# Patient Record
Sex: Male | Born: 1995
Health system: Southern US, Community
[De-identification: ages and names within clinical notes are randomized; demographics above are authoritative.]

## PROBLEM LIST (undated history)

## (undated) DIAGNOSIS — K589 Irritable bowel syndrome without diarrhea: Secondary | ICD-10-CM

## (undated) DIAGNOSIS — R482 Apraxia: Secondary | ICD-10-CM

## (undated) DIAGNOSIS — I10 Essential (primary) hypertension: Secondary | ICD-10-CM

## (undated) DIAGNOSIS — Q642 Congenital posterior urethral valves: Secondary | ICD-10-CM

## (undated) DIAGNOSIS — N289 Disorder of kidney and ureter, unspecified: Secondary | ICD-10-CM

## (undated) DIAGNOSIS — J45909 Unspecified asthma, uncomplicated: Secondary | ICD-10-CM

## (undated) HISTORY — DX: Apraxia: R48.2

## (undated) HISTORY — DX: Essential (primary) hypertension: I10

## (undated) HISTORY — PX: OTHER SURGICAL HISTORY: SHX169

## (undated) HISTORY — DX: Irritable bowel syndrome, unspecified: K58.9

## (undated) HISTORY — PX: ADENOIDECTOMY: SUR15

## (undated) HISTORY — PX: TONSILLECTOMY: SUR1361

## (undated) HISTORY — PX: NEPHRECTOMY: SHX65

## (undated) HISTORY — PX: LASIK: SHX215

## (undated) HISTORY — DX: Unspecified asthma, uncomplicated: J45.909

## (undated) NOTE — *Deleted (*Deleted)
Depression screen [facility] 2/9 02/03/2020 11/17/2019 10/19/2019  Decreased Interest 0 0 2  Down, Depressed, Hopeless 0 0 1  PHQ - 2 Score 0 0 3  Altered sleeping 1 - 1  Tired, decreased energy 3 - 3  Change in appetite 3 - 0  Feeling bad or failure about yourself  0 - 0  Trouble concentrating 1 - 3  Moving slowly or fidgety/restless 0 - 0  Suicidal thoughts 0 - 0  PHQ-9 Score 8 - 10  Difficult doing work/chores Somewhat difficult - Not difficult at all  Some recent data might be hidden

---
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
## 2015-04-13 ENCOUNTER — Other Ambulatory Visit: Payer: Self-pay | Admitting: Gastroenterology

## 2015-04-13 DIAGNOSIS — R110 Nausea: Secondary | ICD-10-CM

## 2015-04-13 DIAGNOSIS — R1011 Right upper quadrant pain: Secondary | ICD-10-CM

## 2015-04-25 ENCOUNTER — Ambulatory Visit (INDEPENDENT_AMBULATORY_CARE_PROVIDER_SITE_OTHER): Payer: BLUE CROSS/BLUE SHIELD | Admitting: Podiatry

## 2015-04-25 ENCOUNTER — Encounter: Payer: Self-pay | Admitting: Podiatry

## 2015-04-25 VITALS — BP 138/81 | HR 82 | Resp 16 | Ht 74.0 in | Wt 166.0 lb

## 2015-04-25 DIAGNOSIS — Z118 Encounter for screening for other infectious and parasitic diseases: Secondary | ICD-10-CM | POA: Diagnosis not present

## 2015-04-25 NOTE — Progress Notes (Signed)
   Subjective:    Patient ID: James Matthews, male    DOB: Jan 27, 1996, 19 y.o.   MRN: [id number]  HPI Patient presents with a skin condition on their left foot, pinky toe. Looks like rash on top of toe and both side of pinky toe. Going on since March 28, 2015.   Review of Systems  Constitutional: Positive for appetite change.  HENT: Positive for trouble swallowing.   Gastrointestinal: Positive for nausea, abdominal pain and abdominal distention.  Allergic/Immunologic: Positive for environmental allergies and food allergies.  Neurological: Positive for headaches.  All other systems reviewed and are negative.      Objective:   Physical Exam        Assessment & Plan:

## 2015-04-26 ENCOUNTER — Ambulatory Visit (HOSPITAL_COMMUNITY)
Admission: RE | Admit: 2015-04-26 | Discharge: 2015-04-26 | Disposition: A | Discharge status: Home / Self Care | Payer: BLUE CROSS/BLUE SHIELD | Source: Ambulatory Visit | Attending: Gastroenterology | Admitting: Gastroenterology

## 2015-04-26 DIAGNOSIS — R110 Nausea: Secondary | ICD-10-CM

## 2015-04-26 DIAGNOSIS — R112 Nausea with vomiting, unspecified: Secondary | ICD-10-CM | POA: Diagnosis not present

## 2015-04-26 DIAGNOSIS — R140 Abdominal distension (gaseous): Secondary | ICD-10-CM | POA: Diagnosis not present

## 2015-04-26 DIAGNOSIS — K219 Gastro-esophageal reflux disease without esophagitis: Secondary | ICD-10-CM | POA: Insufficient documentation

## 2015-04-26 DIAGNOSIS — R1011 Right upper quadrant pain: Secondary | ICD-10-CM | POA: Diagnosis not present

## 2015-04-26 MED ORDER — TECHNETIUM TC 99M SULFUR COLLOID
2.1000 | Freq: Once | INTRAVENOUS | Status: DC | PRN
  Administered 2015-04-26: 2.1 via ORAL
  Filled 2015-04-26: qty 2.1

## 2015-04-26 NOTE — Progress Notes (Signed)
Subjective:     Patient ID: James Matthews, male   DOB: 11-11-1995, 19 y.o.   MRN: [id number]  HPI patient presents with redness on the left fifth toe both proximal and distal on top of the toe with small little bubble present. Has been present for around 4 weeks   Review of Systems  All other systems reviewed and are negative.      Objective:   Physical Exam  Constitutional: He is oriented to person, place, and time.  Cardiovascular: Intact distal pulses.   Musculoskeletal: Normal range of motion.  Neurological: He is oriented to person, place, and time.  Skin: Skin is warm.  Nursing note and vitals reviewed.  neurovascular status intact muscle strength adequate range of motion within normal limits with patient noted to have redness and small bulbous on the fifth digit left that are localized in nature with no proximal edema erythema or drainage noted     Assessment:     Probable fungal infection left    Plan:     Begin over-the-counter Lamisil to be followed by prescription antifungal if symptoms persist and possible oral medicines and if no improvement we will consult the dermatologist for this condition

## 2015-09-26 ENCOUNTER — Other Ambulatory Visit: Payer: Self-pay | Admitting: Nurse Practitioner

## 2015-09-26 DIAGNOSIS — R1084 Generalized abdominal pain: Secondary | ICD-10-CM

## 2015-09-30 ENCOUNTER — Other Ambulatory Visit: Payer: BLUE CROSS/BLUE SHIELD

## 2015-10-11 ENCOUNTER — Ambulatory Visit
Admission: RE | Admit: 2015-10-11 | Discharge: 2015-10-11 | Disposition: A | Discharge status: Home / Self Care | Payer: BLUE CROSS/BLUE SHIELD | Source: Ambulatory Visit | Attending: Gastroenterology | Admitting: Gastroenterology

## 2015-10-11 ENCOUNTER — Other Ambulatory Visit: Payer: Self-pay | Admitting: Gastroenterology

## 2015-10-11 DIAGNOSIS — K5900 Constipation, unspecified: Secondary | ICD-10-CM

## 2015-10-11 DIAGNOSIS — R110 Nausea: Secondary | ICD-10-CM | POA: Diagnosis not present

## 2015-10-11 DIAGNOSIS — K219 Gastro-esophageal reflux disease without esophagitis: Secondary | ICD-10-CM | POA: Diagnosis not present

## 2015-10-11 DIAGNOSIS — R109 Unspecified abdominal pain: Secondary | ICD-10-CM | POA: Diagnosis not present

## 2015-10-26 DIAGNOSIS — N189 Chronic kidney disease, unspecified: Secondary | ICD-10-CM | POA: Diagnosis not present

## 2015-10-26 DIAGNOSIS — N3090 Cystitis, unspecified without hematuria: Secondary | ICD-10-CM | POA: Diagnosis not present

## 2015-10-26 DIAGNOSIS — I129 Hypertensive chronic kidney disease with stage 1 through stage 4 chronic kidney disease, or unspecified chronic kidney disease: Secondary | ICD-10-CM | POA: Diagnosis not present

## 2015-10-26 DIAGNOSIS — N181 Chronic kidney disease, stage 1: Secondary | ICD-10-CM | POA: Diagnosis not present

## 2015-11-30 ENCOUNTER — Ambulatory Visit (INDEPENDENT_AMBULATORY_CARE_PROVIDER_SITE_OTHER): Payer: BLUE CROSS/BLUE SHIELD | Admitting: Licensed Clinical Social Worker

## 2015-11-30 DIAGNOSIS — F331 Major depressive disorder, recurrent, moderate: Secondary | ICD-10-CM | POA: Diagnosis not present

## 2015-11-30 DIAGNOSIS — F064 Anxiety disorder due to known physiological condition: Secondary | ICD-10-CM | POA: Diagnosis not present

## 2015-12-02 DIAGNOSIS — N1330 Unspecified hydronephrosis: Secondary | ICD-10-CM | POA: Diagnosis not present

## 2015-12-02 DIAGNOSIS — N200 Calculus of kidney: Secondary | ICD-10-CM | POA: Diagnosis not present

## 2015-12-02 DIAGNOSIS — Z87442 Personal history of urinary calculi: Secondary | ICD-10-CM | POA: Diagnosis not present

## 2015-12-02 DIAGNOSIS — N319 Neuromuscular dysfunction of bladder, unspecified: Secondary | ICD-10-CM | POA: Diagnosis not present

## 2015-12-02 DIAGNOSIS — Q600 Renal agenesis, unilateral: Secondary | ICD-10-CM | POA: Diagnosis not present

## 2015-12-06 ENCOUNTER — Ambulatory Visit: Payer: BLUE CROSS/BLUE SHIELD | Admitting: Licensed Clinical Social Worker

## 2016-01-02 DIAGNOSIS — K219 Gastro-esophageal reflux disease without esophagitis: Secondary | ICD-10-CM | POA: Diagnosis not present

## 2016-01-05 ENCOUNTER — Other Ambulatory Visit: Payer: Self-pay | Admitting: Gastroenterology

## 2016-01-09 ENCOUNTER — Ambulatory Visit (INDEPENDENT_AMBULATORY_CARE_PROVIDER_SITE_OTHER): Payer: BLUE CROSS/BLUE SHIELD | Admitting: Psychology

## 2016-01-09 DIAGNOSIS — F341 Dysthymic disorder: Secondary | ICD-10-CM | POA: Diagnosis not present

## 2016-01-09 DIAGNOSIS — F064 Anxiety disorder due to known physiological condition: Secondary | ICD-10-CM

## 2016-01-17 ENCOUNTER — Ambulatory Visit (INDEPENDENT_AMBULATORY_CARE_PROVIDER_SITE_OTHER): Payer: BLUE CROSS/BLUE SHIELD | Admitting: Psychology

## 2016-01-17 DIAGNOSIS — F341 Dysthymic disorder: Secondary | ICD-10-CM

## 2016-01-17 DIAGNOSIS — F064 Anxiety disorder due to known physiological condition: Secondary | ICD-10-CM

## 2016-01-18 DIAGNOSIS — K317 Polyp of stomach and duodenum: Secondary | ICD-10-CM | POA: Diagnosis not present

## 2016-01-18 DIAGNOSIS — K219 Gastro-esophageal reflux disease without esophagitis: Secondary | ICD-10-CM | POA: Diagnosis not present

## 2016-01-23 ENCOUNTER — Ambulatory Visit (HOSPITAL_COMMUNITY)
Admission: RE | Admit: 2016-01-23 | Discharge: 2016-01-23 | Disposition: A | Discharge status: Home / Self Care | Payer: BLUE CROSS/BLUE SHIELD | Source: Ambulatory Visit | Attending: Gastroenterology | Admitting: Gastroenterology

## 2016-01-23 ENCOUNTER — Encounter (HOSPITAL_COMMUNITY)
Admission: RE | Disposition: A | Discharge status: Home / Self Care | Payer: Self-pay | Source: Ambulatory Visit | Attending: Gastroenterology

## 2016-01-23 DIAGNOSIS — R12 Heartburn: Secondary | ICD-10-CM | POA: Diagnosis not present

## 2016-01-23 DIAGNOSIS — K449 Diaphragmatic hernia without obstruction or gangrene: Secondary | ICD-10-CM | POA: Insufficient documentation

## 2016-01-23 DIAGNOSIS — R142 Eructation: Secondary | ICD-10-CM | POA: Insufficient documentation

## 2016-01-23 HISTORY — PX: ESOPHAGEAL MANOMETRY: SHX5429

## 2016-01-23 SURGERY — MANOMETRY, ESOPHAGUS

## 2016-01-23 MED ORDER — LIDOCAINE VISCOUS 2 % MT SOLN
OROMUCOSAL | Status: AC
  Filled 2016-01-23: qty 15

## 2016-01-23 SURGICAL SUPPLY — 2 items
FACESHIELD LNG OPTICON STERILE (SAFETY) IMPLANT
GLOVE BIO SURGEON STRL SZ8 (GLOVE) ×4 IMPLANT

## 2016-01-23 NOTE — Progress Notes (Signed)
Esophageal Manometry done per protocol. Pt tolerated well without difficulty or complication.  Report to be sent to Dr. Kenna GilbertMann's office.

## 2016-01-24 ENCOUNTER — Encounter (HOSPITAL_COMMUNITY): Payer: Self-pay | Admitting: Gastroenterology

## 2016-01-24 DIAGNOSIS — K219 Gastro-esophageal reflux disease without esophagitis: Secondary | ICD-10-CM | POA: Diagnosis not present

## 2016-01-30 ENCOUNTER — Ambulatory Visit (INDEPENDENT_AMBULATORY_CARE_PROVIDER_SITE_OTHER): Payer: BLUE CROSS/BLUE SHIELD | Admitting: Psychology

## 2016-01-30 DIAGNOSIS — F064 Anxiety disorder due to known physiological condition: Secondary | ICD-10-CM | POA: Diagnosis not present

## 2016-01-30 DIAGNOSIS — F331 Major depressive disorder, recurrent, moderate: Secondary | ICD-10-CM

## 2016-02-14 ENCOUNTER — Ambulatory Visit (INDEPENDENT_AMBULATORY_CARE_PROVIDER_SITE_OTHER): Payer: BLUE CROSS/BLUE SHIELD | Admitting: Psychology

## 2016-02-14 DIAGNOSIS — F064 Anxiety disorder due to known physiological condition: Secondary | ICD-10-CM | POA: Diagnosis not present

## 2016-02-14 DIAGNOSIS — F331 Major depressive disorder, recurrent, moderate: Secondary | ICD-10-CM

## 2016-02-20 ENCOUNTER — Ambulatory Visit (INDEPENDENT_AMBULATORY_CARE_PROVIDER_SITE_OTHER): Payer: BLUE CROSS/BLUE SHIELD | Admitting: Psychology

## 2016-02-20 DIAGNOSIS — F064 Anxiety disorder due to known physiological condition: Secondary | ICD-10-CM

## 2016-02-20 DIAGNOSIS — F341 Dysthymic disorder: Secondary | ICD-10-CM | POA: Diagnosis not present

## 2016-02-24 DIAGNOSIS — L720 Epidermal cyst: Secondary | ICD-10-CM | POA: Diagnosis not present

## 2016-02-24 DIAGNOSIS — D225 Melanocytic nevi of trunk: Secondary | ICD-10-CM | POA: Diagnosis not present

## 2016-02-28 ENCOUNTER — Ambulatory Visit (INDEPENDENT_AMBULATORY_CARE_PROVIDER_SITE_OTHER): Payer: BLUE CROSS/BLUE SHIELD | Admitting: Psychology

## 2016-02-28 DIAGNOSIS — F341 Dysthymic disorder: Secondary | ICD-10-CM | POA: Diagnosis not present

## 2016-02-28 DIAGNOSIS — F064 Anxiety disorder due to known physiological condition: Secondary | ICD-10-CM | POA: Diagnosis not present

## 2016-03-08 ENCOUNTER — Ambulatory Visit (INDEPENDENT_AMBULATORY_CARE_PROVIDER_SITE_OTHER): Payer: BLUE CROSS/BLUE SHIELD | Admitting: Psychology

## 2016-03-08 DIAGNOSIS — F341 Dysthymic disorder: Secondary | ICD-10-CM | POA: Diagnosis not present

## 2016-03-08 DIAGNOSIS — F064 Anxiety disorder due to known physiological condition: Secondary | ICD-10-CM | POA: Diagnosis not present

## 2016-03-22 ENCOUNTER — Ambulatory Visit (INDEPENDENT_AMBULATORY_CARE_PROVIDER_SITE_OTHER): Payer: BLUE CROSS/BLUE SHIELD | Admitting: Psychology

## 2016-03-22 DIAGNOSIS — F064 Anxiety disorder due to known physiological condition: Secondary | ICD-10-CM | POA: Diagnosis not present

## 2016-03-22 DIAGNOSIS — F341 Dysthymic disorder: Secondary | ICD-10-CM | POA: Diagnosis not present

## 2016-03-29 ENCOUNTER — Ambulatory Visit (INDEPENDENT_AMBULATORY_CARE_PROVIDER_SITE_OTHER): Payer: BLUE CROSS/BLUE SHIELD | Admitting: Psychology

## 2016-03-29 DIAGNOSIS — F064 Anxiety disorder due to known physiological condition: Secondary | ICD-10-CM | POA: Diagnosis not present

## 2016-03-29 DIAGNOSIS — F341 Dysthymic disorder: Secondary | ICD-10-CM | POA: Diagnosis not present

## 2016-04-05 ENCOUNTER — Ambulatory Visit (INDEPENDENT_AMBULATORY_CARE_PROVIDER_SITE_OTHER): Payer: BLUE CROSS/BLUE SHIELD | Admitting: Psychology

## 2016-04-05 DIAGNOSIS — F064 Anxiety disorder due to known physiological condition: Secondary | ICD-10-CM

## 2016-04-05 DIAGNOSIS — F341 Dysthymic disorder: Secondary | ICD-10-CM | POA: Diagnosis not present

## 2016-04-12 ENCOUNTER — Ambulatory Visit (INDEPENDENT_AMBULATORY_CARE_PROVIDER_SITE_OTHER): Payer: BLUE CROSS/BLUE SHIELD | Admitting: Psychology

## 2016-04-12 DIAGNOSIS — F064 Anxiety disorder due to known physiological condition: Secondary | ICD-10-CM

## 2016-04-12 DIAGNOSIS — F341 Dysthymic disorder: Secondary | ICD-10-CM | POA: Diagnosis not present

## 2016-04-19 ENCOUNTER — Ambulatory Visit (INDEPENDENT_AMBULATORY_CARE_PROVIDER_SITE_OTHER): Payer: BLUE CROSS/BLUE SHIELD | Admitting: Psychology

## 2016-04-19 DIAGNOSIS — F341 Dysthymic disorder: Secondary | ICD-10-CM | POA: Diagnosis not present

## 2016-04-19 DIAGNOSIS — F064 Anxiety disorder due to known physiological condition: Secondary | ICD-10-CM

## 2016-04-26 ENCOUNTER — Ambulatory Visit: Payer: BLUE CROSS/BLUE SHIELD | Admitting: Psychology

## 2016-05-01 ENCOUNTER — Ambulatory Visit (INDEPENDENT_AMBULATORY_CARE_PROVIDER_SITE_OTHER): Payer: BLUE CROSS/BLUE SHIELD | Admitting: Psychology

## 2016-05-01 DIAGNOSIS — F341 Dysthymic disorder: Secondary | ICD-10-CM

## 2016-05-01 DIAGNOSIS — F064 Anxiety disorder due to known physiological condition: Secondary | ICD-10-CM | POA: Diagnosis not present

## 2016-05-03 DIAGNOSIS — I129 Hypertensive chronic kidney disease with stage 1 through stage 4 chronic kidney disease, or unspecified chronic kidney disease: Secondary | ICD-10-CM | POA: Diagnosis not present

## 2016-05-03 DIAGNOSIS — N181 Chronic kidney disease, stage 1: Secondary | ICD-10-CM | POA: Diagnosis not present

## 2016-05-03 DIAGNOSIS — E790 Hyperuricemia without signs of inflammatory arthritis and tophaceous disease: Secondary | ICD-10-CM | POA: Diagnosis not present

## 2016-05-03 DIAGNOSIS — K219 Gastro-esophageal reflux disease without esophagitis: Secondary | ICD-10-CM | POA: Diagnosis not present

## 2016-05-03 DIAGNOSIS — Z87440 Personal history of urinary (tract) infections: Secondary | ICD-10-CM | POA: Diagnosis not present

## 2016-05-03 DIAGNOSIS — R809 Proteinuria, unspecified: Secondary | ICD-10-CM | POA: Diagnosis not present

## 2016-05-03 DIAGNOSIS — Z87442 Personal history of urinary calculi: Secondary | ICD-10-CM | POA: Diagnosis not present

## 2016-05-03 DIAGNOSIS — Z905 Acquired absence of kidney: Secondary | ICD-10-CM | POA: Diagnosis not present

## 2016-05-03 DIAGNOSIS — Z79899 Other long term (current) drug therapy: Secondary | ICD-10-CM | POA: Diagnosis not present

## 2016-05-07 ENCOUNTER — Ambulatory Visit (INDEPENDENT_AMBULATORY_CARE_PROVIDER_SITE_OTHER): Payer: BLUE CROSS/BLUE SHIELD | Admitting: Psychology

## 2016-05-07 DIAGNOSIS — F341 Dysthymic disorder: Secondary | ICD-10-CM

## 2016-05-07 DIAGNOSIS — F064 Anxiety disorder due to known physiological condition: Secondary | ICD-10-CM

## 2016-05-10 DIAGNOSIS — Z905 Acquired absence of kidney: Secondary | ICD-10-CM | POA: Diagnosis not present

## 2016-05-10 DIAGNOSIS — Q600 Renal agenesis, unilateral: Secondary | ICD-10-CM | POA: Diagnosis not present

## 2016-05-10 DIAGNOSIS — Q6439 Other atresia and stenosis of urethra and bladder neck: Secondary | ICD-10-CM | POA: Diagnosis not present

## 2016-05-10 DIAGNOSIS — Z8249 Family history of ischemic heart disease and other diseases of the circulatory system: Secondary | ICD-10-CM | POA: Diagnosis not present

## 2016-05-10 DIAGNOSIS — Z79899 Other long term (current) drug therapy: Secondary | ICD-10-CM | POA: Diagnosis not present

## 2016-05-10 DIAGNOSIS — N2881 Hypertrophy of kidney: Secondary | ICD-10-CM | POA: Diagnosis not present

## 2016-05-10 DIAGNOSIS — N318 Other neuromuscular dysfunction of bladder: Secondary | ICD-10-CM | POA: Diagnosis not present

## 2016-05-10 DIAGNOSIS — N319 Neuromuscular dysfunction of bladder, unspecified: Secondary | ICD-10-CM | POA: Diagnosis not present

## 2016-05-10 DIAGNOSIS — Q642 Congenital posterior urethral valves: Secondary | ICD-10-CM | POA: Diagnosis not present

## 2016-05-10 DIAGNOSIS — N1330 Unspecified hydronephrosis: Secondary | ICD-10-CM | POA: Diagnosis not present

## 2016-05-10 DIAGNOSIS — N200 Calculus of kidney: Secondary | ICD-10-CM | POA: Diagnosis not present

## 2016-05-10 DIAGNOSIS — Z841 Family history of disorders of kidney and ureter: Secondary | ICD-10-CM | POA: Diagnosis not present

## 2016-05-11 ENCOUNTER — Ambulatory Visit (INDEPENDENT_AMBULATORY_CARE_PROVIDER_SITE_OTHER): Payer: BLUE CROSS/BLUE SHIELD | Admitting: Psychology

## 2016-05-11 DIAGNOSIS — F064 Anxiety disorder due to known physiological condition: Secondary | ICD-10-CM

## 2016-05-11 DIAGNOSIS — F341 Dysthymic disorder: Secondary | ICD-10-CM

## 2016-05-15 DIAGNOSIS — N1330 Unspecified hydronephrosis: Secondary | ICD-10-CM | POA: Diagnosis not present

## 2016-05-15 DIAGNOSIS — N319 Neuromuscular dysfunction of bladder, unspecified: Secondary | ICD-10-CM | POA: Diagnosis not present

## 2016-05-15 DIAGNOSIS — N200 Calculus of kidney: Secondary | ICD-10-CM | POA: Diagnosis not present

## 2016-05-17 ENCOUNTER — Ambulatory Visit (INDEPENDENT_AMBULATORY_CARE_PROVIDER_SITE_OTHER): Payer: BLUE CROSS/BLUE SHIELD | Admitting: Psychology

## 2016-05-17 DIAGNOSIS — F064 Anxiety disorder due to known physiological condition: Secondary | ICD-10-CM

## 2016-05-17 DIAGNOSIS — F341 Dysthymic disorder: Secondary | ICD-10-CM | POA: Diagnosis not present

## 2016-05-18 ENCOUNTER — Ambulatory Visit: Payer: BLUE CROSS/BLUE SHIELD | Admitting: Psychology

## 2016-05-24 ENCOUNTER — Ambulatory Visit (INDEPENDENT_AMBULATORY_CARE_PROVIDER_SITE_OTHER): Payer: BLUE CROSS/BLUE SHIELD | Admitting: Psychology

## 2016-05-24 DIAGNOSIS — F330 Major depressive disorder, recurrent, mild: Secondary | ICD-10-CM | POA: Diagnosis not present

## 2016-05-24 DIAGNOSIS — F418 Other specified anxiety disorders: Secondary | ICD-10-CM

## 2016-05-24 DIAGNOSIS — F341 Dysthymic disorder: Secondary | ICD-10-CM | POA: Diagnosis not present

## 2016-05-24 DIAGNOSIS — F064 Anxiety disorder due to known physiological condition: Secondary | ICD-10-CM

## 2016-05-28 ENCOUNTER — Ambulatory Visit (INDEPENDENT_AMBULATORY_CARE_PROVIDER_SITE_OTHER): Payer: BLUE CROSS/BLUE SHIELD | Admitting: Psychology

## 2016-05-28 DIAGNOSIS — F418 Other specified anxiety disorders: Secondary | ICD-10-CM | POA: Diagnosis not present

## 2016-05-28 DIAGNOSIS — F330 Major depressive disorder, recurrent, mild: Secondary | ICD-10-CM | POA: Diagnosis not present

## 2016-05-28 DIAGNOSIS — F341 Dysthymic disorder: Secondary | ICD-10-CM

## 2016-05-28 DIAGNOSIS — F064 Anxiety disorder due to known physiological condition: Secondary | ICD-10-CM | POA: Diagnosis not present

## 2016-06-04 ENCOUNTER — Encounter: Payer: Self-pay | Admitting: Family Medicine

## 2016-06-04 ENCOUNTER — Ambulatory Visit (INDEPENDENT_AMBULATORY_CARE_PROVIDER_SITE_OTHER): Payer: BLUE CROSS/BLUE SHIELD | Admitting: Family Medicine

## 2016-06-04 VITALS — BP 118/56 | HR 96 | Temp 98.2°F | Ht 73.0 in | Wt 175.8 lb

## 2016-06-04 DIAGNOSIS — F411 Generalized anxiety disorder: Secondary | ICD-10-CM | POA: Insufficient documentation

## 2016-06-04 DIAGNOSIS — R482 Apraxia: Secondary | ICD-10-CM | POA: Insufficient documentation

## 2016-06-04 DIAGNOSIS — K589 Irritable bowel syndrome without diarrhea: Secondary | ICD-10-CM | POA: Insufficient documentation

## 2016-06-04 DIAGNOSIS — I10 Essential (primary) hypertension: Secondary | ICD-10-CM | POA: Insufficient documentation

## 2016-06-04 DIAGNOSIS — N3289 Other specified disorders of bladder: Secondary | ICD-10-CM | POA: Insufficient documentation

## 2016-06-04 DIAGNOSIS — F339 Major depressive disorder, recurrent, unspecified: Secondary | ICD-10-CM | POA: Insufficient documentation

## 2016-06-04 DIAGNOSIS — F331 Major depressive disorder, recurrent, moderate: Secondary | ICD-10-CM

## 2016-06-04 DIAGNOSIS — N182 Chronic kidney disease, stage 2 (mild): Secondary | ICD-10-CM | POA: Insufficient documentation

## 2016-06-04 DIAGNOSIS — Q642 Congenital posterior urethral valves: Secondary | ICD-10-CM | POA: Insufficient documentation

## 2016-06-04 DIAGNOSIS — K219 Gastro-esophageal reflux disease without esophagitis: Secondary | ICD-10-CM | POA: Insufficient documentation

## 2016-06-04 MED ORDER — ALPRAZOLAM 0.5 MG PO TABS: 0.5000 mg | ORAL_TABLET | Freq: Two times a day (BID) | ORAL | 0 refills | Status: DC | PRN

## 2016-06-04 MED ORDER — ESCITALOPRAM OXALATE 10 MG PO TABS: 10.0000 mg | ORAL_TABLET | Freq: Every day | ORAL | 2 refills | Status: DC

## 2016-06-04 NOTE — Patient Instructions (Signed)
Start lexapro- goal is for this to help with anxiety and depression. See me again in about 2 weeks so we can regroup. May go up in dose at that time depending on how you are doing.   Taking the medicine as directed and not missing any doses is one of the best things you can do to treat your depression.  Here are some things to keep in mind:  1) Side effects (stomach upset, some increased anxiety) may happen before you notice a benefit.  These side effects typically go away over time. 2) Changes to your dose of medicine or a change in medication all together is sometimes necessary 3) Most people need to be on medication at least 6-12 months 4) Many people will notice an improvement within two weeks but the full effect of the medication can take up to 4-6 weeks 5) Stopping the medication when you start feeling better often results in a return of symptoms 6) If you start having thoughts of hurting yourself or others after starting this medicine, call our office immediately at [phone] or seek care through 911.

## 2016-06-04 NOTE — Assessment & Plan Note (Addendum)
S:Diagnosed by Dr. Jason FilaBray. Also GAD. No ADD. Started in 4th grade after some bullying he experienced for his verbal apraxia. Continued mild to moderate issues. PHQ9 today of 15 with no SI, GAD7 of 13. History of trial prozac with "up boost" in his mood. He denies every having mania symptoms outside of that potential period- but he states he was up in day but still rested at night. No anemia or tsh issues within a year as per review of careeverywhere A/P: Will trial lexapro 10mg  with follow up in 2 weeks. Also short term xanax to help with anxiety portion #30- hopeful we can eventually reduce his 6  A week.

## 2016-06-04 NOTE — Progress Notes (Signed)
Phone: [phone]  Subjective:  Patient presents today to establish care. Prior patient of Dr. Tomi [place]ambergerSusan Fuller in [place] . Chief complaint-noted.   See problem oriented charting  The following were reviewed and entered/updated in epic: Past Medical History:  Diagnosis Date  . Hypertension   . IBS (irritable bowel syndrome)    probiotic  . Posterior urethral valves   . Premature birth    3 months early  . Renal disorder   . Verbal apraxia    Patient Active Problem List   Diagnosis Date Noted  . Major depressive disorder, recurrent episode, moderate (HCC) 06/04/2016    Priority: High  . Verbal apraxia     Priority: High  . Posterior urethral valves (PUV) 06/04/2016    Priority: Medium  . Essential hypertension 06/04/2016    Priority: Medium  . Bladder spasms 06/04/2016    Priority: Medium  . GAD (generalized anxiety disorder) 06/04/2016    Priority: Medium  . Chronic kidney disease, stage I 06/04/2016    Priority: Medium  . GERD (gastroesophageal reflux disease) 06/04/2016    Priority: Low  . Premature birth     Priority: Low  . IBS (irritable bowel syndrome)     Priority: Low   Past Surgical History:  Procedure Laterality Date  . ESOPHAGEAL MANOMETRY N/A 01/23/2016   Procedure: ESOPHAGEAL MANOMETRY (EM);  Surgeon: Charna ElizabethJyothi [name], MD;  Location: [facility];  Service: Endoscopy;  Laterality: N/A;  . NEPHRECTOMY     rght   . TONSILLECTOMY      Family History  Problem Relation Age of Onset  . Breast cancer Mother     survivor  . Hypertension Mother   . Hyperlipidemia Mother   . Heart disease Maternal Grandfather     Medications- reviewed and updated Current Outpatient Prescriptions  Medication Sig Dispense Refill  . ALPRAZolam (XANAX) 0.5 MG tablet Take 1 tablet (0.5 mg total) by mouth 2 (two) times daily as needed for anxiety. 30 tablet 0  . losartan (COZAAR) 25 MG tablet Take 25 mg by mouth daily.    Marland Kitchen. oxybutynin (DITROPAN) 5 MG tablet Take 5 mg  by mouth at bedtime.    Marland Kitchen. escitalopram (LEXAPRO) 10 MG tablet Take 1 tablet (10 mg total) by mouth daily. 30 tablet 2   No current facility-administered medications for this visit.     Allergies-reviewed and updated Allergies  Allergen Reactions  . Lisinopril Other (See Comments)  . Other Other (See Comments)    Pain med? Unsure of medication, caused n/v    Social History   Social History  . Marital status: Single    Spouse name: N/A  . Number of children: N/A  . Years of education: N/A   Social History Main Topics  . Smoking status: Never Smoker  . Smokeless tobacco: None  . Alcohol use No  . Drug use: No  . Sexual activity: Not Asked   Other Topics Concern  . None   Social History Narrative   Single. Lives with mom and dad. His brother and wife and nephew also live with him. Brother and sister- healthy.       Full time at [employer]. Studying health sciences and english. Premed.     ROS--Full ROS was completed Review of Systems  Constitutional: Negative for chills and fever.  HENT: Negative for hearing loss and tinnitus.   Eyes: Negative for blurred vision and double vision.  Respiratory: Negative for cough and hemoptysis.   Cardiovascular: Negative for chest pain  and palpitations.  Gastrointestinal: Negative for heartburn and nausea.  Genitourinary: Negative for dysuria and urgency.  Musculoskeletal: Negative for myalgias and neck pain.  Skin: Negative for itching and rash.  Neurological: Negative for dizziness and headaches.  Endo/Heme/Allergies: Negative for polydipsia. Does not bruise/bleed easily.  Psychiatric/Behavioral: Positive for depression. Negative for hallucinations, substance abuse and suicidal ideas. The patient is nervous/anxious.    Objective: BP (!) 118/56 (BP Location: Left Arm, Patient Position: Sitting, Cuff Size: Large)   Pulse 96   Temp 98.2 F (36.8 C) (Oral)   Ht 6\' 1"  (1.854 m)   Wt 175 lb 12.8 oz (79.7 kg)   SpO2 98%    BMI 23.19 kg/m  Gen: NAD, resting comfortably HEENT: Mucous membranes are moist. Oropharynx normal. TM normal. Eyes: sclera and lids normal, PERRLA Neck: no thyromegaly, no cervical lymphadenopathy CV: RRR no murmurs rubs or gallops Lungs: CTAB no crackles, wheeze, rhonchi Abdomen: soft/nontender/nondistended/normal bowel sounds. No rebound or guarding.  Ext: no edema Skin: warm, dry Neuro: 5/5 strength in upper and lower extremities, normal gait, normal reflexes, stutters and difficulty finding words at times  Assessment/Plan:  Major depressive disorder, recurrent episode, moderate (HCC) S:Diagnosed by Dr. Jason FilaBray. Also GAD. No ADD. Started in 4th grade after some bullying he experienced for his verbal apraxia. Continued mild to moderate issues. PHQ9 today of 15 with no SI, GAD7 of 13. History of trial prozac with "up boost" in his mood. He denies every having mania symptoms outside of that potential period- but he states he was up in day but still rested at night. No anemia or tsh issues within a year as per review of careeverywhere A/P: Will trial lexapro 10mg  with follow up in 2 weeks. Also short term xanax to help with anxiety portion #30- hopeful we can eventually reduce his 6  A week.    2 weeks  Meds ordered this encounter  . escitalopram (LEXAPRO) 10 MG tablet    Sig: Take 1 tablet (10 mg total) by mouth daily.    Dispense:  30 tablet    Refill:  2  . ALPRAZolam (XANAX) 0.5 MG tablet    Sig: Take 1 tablet (0.5 mg total) by mouth 2 (two) times daily as needed for anxiety.    Dispense:  30 tablet    Refill:  0   The duration of face-to-face time during this visit was greater than 30 minutes. Greater than 50% of this time was spent in counseling about depression, medication options, side effects and needed follow up.    Return precautions advised.  Tana ConchStephen Destry Dauber, MD

## 2016-06-04 NOTE — Progress Notes (Signed)
Pre visit review using our clinic review tool, if applicable. No additional management support is needed unless otherwise documented below in the visit note. 

## 2016-06-08 ENCOUNTER — Encounter: Payer: Self-pay | Admitting: Family Medicine

## 2016-06-18 ENCOUNTER — Encounter: Payer: Self-pay | Admitting: Family Medicine

## 2016-06-18 ENCOUNTER — Ambulatory Visit (INDEPENDENT_AMBULATORY_CARE_PROVIDER_SITE_OTHER): Payer: BLUE CROSS/BLUE SHIELD | Admitting: Family Medicine

## 2016-06-18 VITALS — BP 120/66 | HR 90 | Temp 97.8°F | Ht 73.0 in | Wt 171.8 lb

## 2016-06-18 DIAGNOSIS — F411 Generalized anxiety disorder: Secondary | ICD-10-CM

## 2016-06-18 DIAGNOSIS — F331 Major depressive disorder, recurrent, moderate: Secondary | ICD-10-CM

## 2016-06-18 MED ORDER — ALPRAZOLAM 0.5 MG PO TABS: 0.5000 mg | ORAL_TABLET | Freq: Two times a day (BID) | ORAL | 0 refills | Status: DC | PRN

## 2016-06-18 NOTE — Assessment & Plan Note (Signed)
GAD S: Patient started lexapro 10mg . At first with constipation and heartburn. Heartburn resolved within a few days. Constipation resolved with fiber gummies. More bothersome symptom is that anxiety frequency ha spicked up but not intensity. Was using xanax 4-6 days a week and now up to about 10 pills a week. PHQ9 of 18 with no SI.  A/P: Side effects not tolerable at present- we opted to continue current dose to see if anxiety resolves/improves while covering with short course of xanax again. Hopeful can increase to 20mg  of lexapro at 4 week follow up (would be 6 weeks in). Update phq9 and GAD7 at follow up.

## 2016-06-18 NOTE — Patient Instructions (Signed)
Not seeing great benefit yet on medicine but only at  2 weeks. With anxiety increasing in frequency- hold off on upping lexapro for now and reassess in 4-5 weeks- likely increase at that time if anxiety better. Short term xanax refilled to help with the short term increase in anxiety.   Any thoughts of hurting yourself once again contact us immediately

## 2016-06-18 NOTE — Progress Notes (Signed)
Subjective:  James Matthews is a 20 y.o. year old very pleasant male patient who presents for/with See problem oriented charting ROS- some increased anxiety. Admits to depressed mood. Poor sleep. Some anhedonia.   Past Medical History-  Patient Active Problem List   Diagnosis Date Noted  . Major depressive disorder, recurrent episode, moderate (HCC) 06/04/2016    Priority: High  . Verbal apraxia     Priority: High  . Posterior urethral valves (PUV) 06/04/2016    Priority: Medium  . Essential hypertension 06/04/2016    Priority: Medium  . Bladder spasms 06/04/2016    Priority: Medium  . GAD (generalized anxiety disorder) 06/04/2016    Priority: Medium  . Chronic kidney disease, stage I 06/04/2016    Priority: Medium  . GERD (gastroesophageal reflux disease) 06/04/2016    Priority: Low  . Premature birth     Priority: Low  . IBS (irritable bowel syndrome)     Priority: Low    Medications- reviewed and updated Current Outpatient Prescriptions  Medication Sig Dispense Refill  . ALPRAZolam (XANAX) 0.5 MG tablet Take 1 tablet (0.5 mg total) by mouth 2 (two) times daily as needed for anxiety. 30 tablet 0  . escitalopram (LEXAPRO) 10 MG tablet Take 1 tablet (10 mg total) by mouth daily. 30 tablet 2  . losartan (COZAAR) 25 MG tablet Take 50 mg by mouth daily.     Marland Kitchen. oxybutynin (DITROPAN) 5 MG tablet Take 5 mg by mouth at bedtime.     No current facility-administered medications for this visit.     Objective: BP 120/66 (BP Location: Left Arm, Patient Position: Sitting, Cuff Size: Large)   Pulse 90   Temp 97.8 F (36.6 C) (Oral)   Ht 6\' 1"  (1.854 m)   Wt 171 lb 12.8 oz (77.9 kg)   SpO2 97%   BMI 22.67 kg/m  Gen: NAD, resting comfortably, stutters  Assessment/Plan:  Major depressive disorder, recurrent episode, moderate (HCC) GAD S: Patient started lexapro 10mg . At first with constipation and heartburn. Heartburn resolved within a few days. Constipation resolved  with fiber gummies. More bothersome symptom is that anxiety frequency ha spicked up but not intensity. Was using xanax 4-6 days a week and now up to about 10 pills a week. PHQ9 of 18 with no SI.  A/P: Side effects not tolerable at present- we opted to continue current dose to see if anxiety resolves/improves while covering with short course of xanax again. Hopeful can increase to 20mg  of lexapro at 4 week follow up (would be 6 weeks in). Update phq9 and GAD7 at follow up.    Meds ordered this encounter  Medications  . ALPRAZolam (XANAX) 0.5 MG tablet    Sig: Take 1 tablet (0.5 mg total) by mouth 2 (two) times daily as needed for anxiety.    Dispense:  30 tablet    Refill:  0    Return precautions advised.  James ConchStephen Aldan Camey, MD

## 2016-06-18 NOTE — Progress Notes (Signed)
Pre visit review using our clinic review tool, if applicable. No additional management support is needed unless otherwise documented below in the visit note. 

## 2016-06-22 ENCOUNTER — Ambulatory Visit (INDEPENDENT_AMBULATORY_CARE_PROVIDER_SITE_OTHER): Payer: BLUE CROSS/BLUE SHIELD | Admitting: Psychology

## 2016-06-22 DIAGNOSIS — F341 Dysthymic disorder: Secondary | ICD-10-CM | POA: Diagnosis not present

## 2016-06-22 DIAGNOSIS — F064 Anxiety disorder due to known physiological condition: Secondary | ICD-10-CM

## 2016-06-26 ENCOUNTER — Ambulatory Visit (INDEPENDENT_AMBULATORY_CARE_PROVIDER_SITE_OTHER): Payer: BLUE CROSS/BLUE SHIELD | Admitting: Psychology

## 2016-06-26 DIAGNOSIS — F064 Anxiety disorder due to known physiological condition: Secondary | ICD-10-CM | POA: Diagnosis not present

## 2016-06-26 DIAGNOSIS — F341 Dysthymic disorder: Secondary | ICD-10-CM

## 2016-07-12 ENCOUNTER — Encounter: Payer: Self-pay | Admitting: Family Medicine

## 2016-07-13 ENCOUNTER — Other Ambulatory Visit: Payer: Self-pay

## 2016-07-16 ENCOUNTER — Encounter: Payer: Self-pay | Admitting: Family Medicine

## 2016-07-20 ENCOUNTER — Encounter: Payer: Self-pay | Admitting: Family Medicine

## 2016-07-20 ENCOUNTER — Ambulatory Visit (INDEPENDENT_AMBULATORY_CARE_PROVIDER_SITE_OTHER): Payer: BLUE CROSS/BLUE SHIELD | Admitting: Family Medicine

## 2016-07-20 VITALS — BP 122/68 | HR 92 | Temp 98.2°F | Wt 171.6 lb

## 2016-07-20 DIAGNOSIS — F411 Generalized anxiety disorder: Secondary | ICD-10-CM | POA: Diagnosis not present

## 2016-07-20 DIAGNOSIS — F331 Major depressive disorder, recurrent, moderate: Secondary | ICD-10-CM

## 2016-07-20 MED ORDER — ALPRAZOLAM 0.5 MG PO TABS: 0.5000 mg | ORAL_TABLET | Freq: Two times a day (BID) | ORAL | 0 refills | Status: DC | PRN

## 2016-07-20 MED ORDER — ESCITALOPRAM OXALATE 20 MG PO TABS: 10.0000 mg | ORAL_TABLET | Freq: Every day | ORAL | 2 refills | Status: DC

## 2016-07-20 NOTE — Patient Instructions (Signed)
Increase lexapro to 20mg   Refill xanax  Follow up 6 weeks, contact us immediately or call [phone] if any thoughts of hurting yourself.   Journal your symptoms- we could try an alternate antidepressant/antianxiety medicine if needed

## 2016-07-20 NOTE — Progress Notes (Signed)
Subjective:  [place] Wonda ChengChristian Nahm is a 21 y.o. year old very pleasant male patient who presents for/with See problem oriented charting ROS- admits to anxiety, depressed mood. No SI. No chest pain or palpitations   Past Medical History-  Patient Active Problem List   Diagnosis Date Noted  . Major depressive disorder, recurrent episode, moderate (HCC) 06/04/2016    Priority: High  . Verbal apraxia     Priority: High  . Posterior urethral valves (PUV) 06/04/2016    Priority: Medium  . Essential hypertension 06/04/2016    Priority: Medium  . Bladder spasms 06/04/2016    Priority: Medium  . GAD (generalized anxiety disorder) 06/04/2016    Priority: Medium  . Chronic kidney disease, stage I 06/04/2016    Priority: Medium  . GERD (gastroesophageal reflux disease) 06/04/2016    Priority: Low  . Premature birth     Priority: Low  . IBS (irritable bowel syndrome)     Priority: Low    Medications- reviewed and updated Current Outpatient Prescriptions  Medication Sig Dispense Refill  . ALPRAZolam (XANAX) 0.5 MG tablet Take 1 tablet (0.5 mg total) by mouth 2 (two) times daily as needed for anxiety. 30 tablet 0  . escitalopram (LEXAPRO) 20 MG tablet Take 0.5 tablets (10 mg total) by mouth daily. 30 tablet 2  . losartan (COZAAR) 25 MG tablet Take 50 mg by mouth daily.     . TOLTERODINE TARTRATE ER PO Take 2 mg by mouth daily.     No current facility-administered medications for this visit.     Objective: BP 122/68   Pulse 92   Temp 98.2 F (36.8 C) (Oral)   Wt 171 lb 9.6 oz (77.8 kg)   SpO2 99%   BMI 22.64 kg/m  Gen: NAD, resting comfortably CV: RRR no murmurs rubs or gallops Lungs: CTAB no crackles, wheeze, rhonchi  Ext: no edema Neuro: verbal apraxia/stuttering noted  Assessment/Plan:  Major depressive disorder, recurrent episode, moderate (HCC) GAD S:ONly mild improvement on lexapro 10mg  and has been over 6 weeks now with phq9 of 17, gad7 of 16. No SI. Xanax use 10  pills a week. Half tablet to start- full if needed 30 minutes later. He is no longer having increased frequency of anxiety has normalized again and thinks may be able to tolerate higher dose of lexapro. He does think he is slightly More forgetful on medicine. Forgets a few tasks. A/P: we will increase lexapro to 20mg  at this time. Refilled xanax- hopefully anxiety starts to improve with higher dose of lexapro. I wonder about buspar as well to see if that would cut down on xanax need. May need to return to Dr. Jason FilaBray for CBT aswell when she returns.     Meds ordered this encounter  Medications  . TOLTERODINE TARTRATE ER PO    Sig: Take 2 mg by mouth daily.  Marland Kitchen. ALPRAZolam (XANAX) 0.5 MG tablet    Sig: Take 1 tablet (0.5 mg total) by mouth 2 (two) times daily as needed for anxiety.    Dispense:  30 tablet    Refill:  0  . escitalopram (LEXAPRO) 20 MG tablet    Sig: Take 0.5 tablets (10 mg total) by mouth daily.    Dispense:  30 tablet    Refill:  2    Return precautions advised.  Tana ConchStephen Meli Faley, MD

## 2016-07-20 NOTE — Progress Notes (Signed)
Pre visit review using our clinic review tool, if applicable. No additional management support is needed unless otherwise documented below in the visit note. 

## 2016-07-21 NOTE — Assessment & Plan Note (Signed)
GAD S:ONly mild improvement on lexapro 10mg  and has been over 6 weeks now with phq9 of 17, gad7 of 16. No SI. Xanax use 10 pills a week. Half tablet to start- full if needed 30 minutes later. He is no longer having increased frequency of anxiety has normalized again and thinks may be able to tolerate higher dose of lexapro. He does think he is slightly More forgetful on medicine. Forgets a few tasks. A/P: we will increase lexapro to 20mg  at this time. Refilled xanax- hopefully anxiety starts to improve with higher dose of lexapro. I wonder about buspar as well to see if that would cut down on xanax need. May need to return to Dr. Jason FilaBray for CBT aswell when she returns.

## 2016-07-24 ENCOUNTER — Encounter (HOSPITAL_COMMUNITY): Payer: Self-pay | Admitting: Emergency Medicine

## 2016-07-24 ENCOUNTER — Emergency Department (HOSPITAL_COMMUNITY): Payer: BLUE CROSS/BLUE SHIELD

## 2016-07-24 ENCOUNTER — Emergency Department (HOSPITAL_COMMUNITY)
Admission: EM | Admit: 2016-07-24 | Discharge: 2016-07-25 | Disposition: A | Discharge status: Home / Self Care | Payer: BLUE CROSS/BLUE SHIELD | Attending: Emergency Medicine | Admitting: Emergency Medicine

## 2016-07-24 ENCOUNTER — Ambulatory Visit (INDEPENDENT_AMBULATORY_CARE_PROVIDER_SITE_OTHER): Payer: BLUE CROSS/BLUE SHIELD | Admitting: Psychology

## 2016-07-24 DIAGNOSIS — E860 Dehydration: Secondary | ICD-10-CM | POA: Diagnosis not present

## 2016-07-24 DIAGNOSIS — R1032 Left lower quadrant pain: Secondary | ICD-10-CM | POA: Insufficient documentation

## 2016-07-24 DIAGNOSIS — R197 Diarrhea, unspecified: Secondary | ICD-10-CM | POA: Insufficient documentation

## 2016-07-24 DIAGNOSIS — R112 Nausea with vomiting, unspecified: Secondary | ICD-10-CM | POA: Diagnosis not present

## 2016-07-24 DIAGNOSIS — F341 Dysthymic disorder: Secondary | ICD-10-CM

## 2016-07-24 DIAGNOSIS — I129 Hypertensive chronic kidney disease with stage 1 through stage 4 chronic kidney disease, or unspecified chronic kidney disease: Secondary | ICD-10-CM | POA: Diagnosis not present

## 2016-07-24 DIAGNOSIS — N181 Chronic kidney disease, stage 1: Secondary | ICD-10-CM | POA: Insufficient documentation

## 2016-07-24 DIAGNOSIS — F064 Anxiety disorder due to known physiological condition: Secondary | ICD-10-CM | POA: Diagnosis not present

## 2016-07-24 DIAGNOSIS — R509 Fever, unspecified: Secondary | ICD-10-CM | POA: Diagnosis not present

## 2016-07-24 DIAGNOSIS — R1110 Vomiting, unspecified: Secondary | ICD-10-CM | POA: Diagnosis not present

## 2016-07-24 LAB — CBC WITH DIFFERENTIAL/PLATELET
Basophils Absolute: 0 10*3/uL (ref 0.0–0.1)
Basophils Relative: 0 %
Eosinophils Absolute: 0 10*3/uL (ref 0.0–0.7)
Eosinophils Relative: 0 %
HCT: 42.4 % (ref 39.0–52.0)
Hemoglobin: 15.3 g/dL (ref 13.0–17.0)
Lymphocytes Relative: 5 %
Lymphs Abs: 0.6 10*3/uL — ABNORMAL LOW (ref 0.7–4.0)
MCH: 32.3 pg (ref 26.0–34.0)
MCHC: 36.1 g/dL — ABNORMAL HIGH (ref 30.0–36.0)
MCV: 89.6 fL (ref 78.0–100.0)
Monocytes Absolute: 0.4 10*3/uL (ref 0.1–1.0)
Monocytes Relative: 3 %
Neutro Abs: 11.8 10*3/uL — ABNORMAL HIGH (ref 1.7–7.7)
Neutrophils Relative %: 92 %
Platelets: 244 10*3/uL (ref 150–400)
RBC: 4.73 MIL/uL (ref 4.22–5.81)
RDW: 12.4 % (ref 11.5–15.5)
WBC: 12.9 10*3/uL — ABNORMAL HIGH (ref 4.0–10.5)

## 2016-07-24 LAB — URINALYSIS, ROUTINE W REFLEX MICROSCOPIC
Bilirubin Urine: NEGATIVE
Glucose, UA: NEGATIVE mg/dL
Hgb urine dipstick: NEGATIVE
Ketones, ur: 5 mg/dL — AB
Leukocytes, UA: NEGATIVE
Nitrite: NEGATIVE
Protein, ur: NEGATIVE mg/dL
Specific Gravity, Urine: 1.036 — ABNORMAL HIGH (ref 1.005–1.030)
pH: 6 (ref 5.0–8.0)

## 2016-07-24 LAB — LIPASE, BLOOD: Lipase: 27 U/L (ref 11–51)

## 2016-07-24 LAB — COMPREHENSIVE METABOLIC PANEL
ALT: 24 U/L (ref 17–63)
AST: 28 U/L (ref 15–41)
Albumin: 5.2 g/dL — ABNORMAL HIGH (ref 3.5–5.0)
Alkaline Phosphatase: 43 U/L (ref 38–126)
Anion gap: 10 (ref 5–15)
BUN: 16 mg/dL (ref 6–20)
CO2: 25 mmol/L (ref 22–32)
Calcium: 9.4 mg/dL (ref 8.9–10.3)
Chloride: 104 mmol/L (ref 101–111)
Creatinine, Ser: 1.16 mg/dL (ref 0.61–1.24)
GFR calc Af Amer: >60 mL/min (ref >60)
GFR calc non Af Amer: >60 mL/min (ref >60)
Glucose, Bld: 124 mg/dL — ABNORMAL HIGH (ref 65–99)
Potassium: 4.1 mmol/L (ref 3.5–5.1)
Sodium: 139 mmol/L (ref 135–145)
Total Bilirubin: 1 mg/dL (ref 0.3–1.2)
Total Protein: 8 g/dL (ref 6.5–8.1)

## 2016-07-24 LAB — LACTIC ACID, PLASMA: Lactic Acid, Venous: 1.6 mmol/L (ref 0.5–1.9)

## 2016-07-24 LAB — I-STAT CG4 LACTIC ACID, ED: Lactic Acid, Venous: 2.28 mmol/L (ref 0.5–1.9)

## 2016-07-24 MED ORDER — IBUPROFEN 200 MG PO TABS
600.0000 mg | ORAL_TABLET | Freq: Once | ORAL | Status: AC
  Administered 2016-07-24: 600 mg via ORAL
  Filled 2016-07-24: qty 3

## 2016-07-24 MED ORDER — ONDANSETRON 4 MG PO TBDP: 4.0000 mg | ORAL_TABLET | Freq: Three times a day (TID) | ORAL | 0 refills | Status: DC | PRN

## 2016-07-24 MED ORDER — ACETAMINOPHEN 500 MG PO TABS
1000.0000 mg | ORAL_TABLET | Freq: Once | ORAL | Status: AC
  Administered 2016-07-24: 1000 mg via ORAL
  Filled 2016-07-24: qty 2

## 2016-07-24 MED ORDER — LOPERAMIDE HCL 2 MG PO CAPS: 2.0000 mg | ORAL_CAPSULE | Freq: Four times a day (QID) | ORAL | 0 refills | Status: DC | PRN

## 2016-07-24 MED ORDER — SODIUM CHLORIDE 0.9 % IV BOLUS (SEPSIS)
2000.0000 mL | Freq: Once | INTRAVENOUS | Status: AC
  Administered 2016-07-24: 2000 mL via INTRAVENOUS

## 2016-07-24 MED ORDER — SODIUM CHLORIDE 0.9 % IV BOLUS (SEPSIS)
1000.0000 mL | Freq: Once | INTRAVENOUS | Status: AC
  Administered 2016-07-24: 1000 mL via INTRAVENOUS

## 2016-07-24 MED ORDER — MORPHINE SULFATE (PF) 4 MG/ML IV SOLN
4.0000 mg | Freq: Once | INTRAVENOUS | Status: AC
  Administered 2016-07-24: 4 mg via INTRAVENOUS
  Filled 2016-07-24: qty 1

## 2016-07-24 MED ORDER — PROMETHAZINE HCL 25 MG/ML IJ SOLN
12.5000 mg | Freq: Once | INTRAMUSCULAR | Status: AC
  Administered 2016-07-24: 20:00:00 via INTRAVENOUS
  Filled 2016-07-24 (×2): qty 1

## 2016-07-24 MED ORDER — IOPAMIDOL (ISOVUE-300) INJECTION 61%
INTRAVENOUS | Status: AC
  Filled 2016-07-24: qty 100

## 2016-07-24 MED ORDER — IOPAMIDOL (ISOVUE-300) INJECTION 61%
100.0000 mL | Freq: Once | INTRAVENOUS | Status: AC | PRN
  Administered 2016-07-24: 80 mL via INTRAVENOUS

## 2016-07-24 NOTE — ED Notes (Signed)
Pt is aware urine sample is needed. 

## 2016-07-24 NOTE — ED Provider Notes (Signed)
Emergency Department Provider Note   I have reviewed the triage vital signs and the nursing notes.   HISTORY  Chief Complaint Emesis and Diarrhea   HPI James Matthews is a 21 y.o. male with PMH of HTN, IBS, and GERD who presents to the emergency department for evaluation of acute onset vomiting and diarrhea with severe left lower quadrant abdominal pain. She states that symptoms began at 1 PM today. He had been feeling nauseated and fatigued for the last 1-2 days but only today began having severe symptoms. He denies any chest pain or difficulty breathing. No blood in the emesis or bowel movements. He has a mild headache. No URI symptoms. The patient received Zofran and is now feeling better. Vomiting and Diarrhea made worse with eating or drinking. Severe sick contacts last week with similar symptoms.    Past Medical History:  Diagnosis Date  . Hypertension   . IBS (irritable bowel syndrome)    probiotic  . Posterior urethral valves   . Premature birth    3 months early  . Renal disorder   . Verbal apraxia     Patient Active Problem List   Diagnosis Date Noted  . Posterior urethral valves (PUV) 06/04/2016  . GERD (gastroesophageal reflux disease) 06/04/2016  . Essential hypertension 06/04/2016  . Bladder spasms 06/04/2016  . GAD (generalized anxiety disorder) 06/04/2016  . Chronic kidney disease, stage I 06/04/2016  . Major depressive disorder, recurrent episode, moderate (HCC) 06/04/2016  . Premature birth   . IBS (irritable bowel syndrome)   . Verbal apraxia     Past Surgical History:  Procedure Laterality Date  . ESOPHAGEAL MANOMETRY N/A 01/23/2016   Procedure: ESOPHAGEAL MANOMETRY (EM);  Surgeon: Charna ElizabethJyothi [name], MD;  Location: [facility];  Service: Endoscopy;  Laterality: N/A;  . LASIK     eye surgery july 2017  . NEPHRECTOMY     rght   . TONSILLECTOMY      Current Outpatient Rx  . Order #: [id number] Class: Print  . Order #: [id number] Class: Normal    . Order #: [id number] Class: Historical Med  . Order #: [id number] Class: Historical Med  . Order #: [id number] Class: Historical Med  . Order #: [id number] Class: Historical Med  . Order #: [id number] Class: Historical Med  . Order #: [id number] Class: Historical Med  . Order #: [id number] Class: Print  . Order #: [id number] Class: Print    Allergies Lisinopril and Other  Family History  Problem Relation Age of Onset  . Breast cancer Mother     survivor  . Hypertension Mother   . Hyperlipidemia Mother   . Heart disease Maternal Grandfather     Social History Social History  Substance Use Topics  . Smoking status: Never Smoker  . Smokeless tobacco: Not on file  . Alcohol use No    Review of Systems  Constitutional: Positive fever/chills Eyes: No visual changes. ENT: No sore throat. Cardiovascular: Denies chest pain. Respiratory: Denies shortness of breath. Gastrointestinal: Positive LLQ abdominal pain.Positive nausea, vomiting, or diarrhea.  No constipation. Genitourinary: Negative for dysuria. Musculoskeletal: Negative for back pain. Skin: Negative for rash. Neurological: Negative for focal weakness or numbness. Positive mild HA  10-point ROS otherwise negative.  ____________________________________________   PHYSICAL EXAM:  VITAL SIGNS: ED Triage Vitals  Enc Vitals Group     BP 07/24/16 1806 112/77     Pulse Rate 07/24/16 1806 (!) 129     Resp 07/24/16 1806 11     Temp 07/24/16 1806 98.7 F (37.1 C)  Temp Source 07/24/16 1806 Oral     SpO2 07/24/16 1804 100 %     Weight 07/24/16 1806 168 lb (76.2 kg)     Height 07/24/16 1806 6\' 1"  (1.854 m)     Pain Score 07/24/16 1809 6    Constitutional: Alert and oriented. Appears slightly uncomfortable but in no acute distress.  Eyes: Conjunctivae are normal.  Head: Atraumatic. Nose: No congestion/rhinnorhea. Mouth/Throat: Mucous membranes are very dry.  Neck: No stridor.  No meningeal signs.   Cardiovascular:  Tachycardia. Good peripheral circulation. Grossly normal heart sounds.   Respiratory: Normal respiratory effort.  No retractions. Lungs CTAB. Gastrointestinal: Soft with focal LLQ tenderness to palpation. No distention.  Musculoskeletal: No lower extremity tenderness nor edema. No gross deformities of extremities. Neurologic:  Normal speech and language. No gross focal neurologic deficits are appreciated.  Skin:  Skin is warm, dry and intact. No rash noted. Psychiatric: Mood and affect are normal. Speech and behavior are normal.  ____________________________________________   LABS (all labs ordered are listed, but only abnormal results are displayed)  Labs Reviewed  COMPREHENSIVE METABOLIC PANEL - Abnormal; Notable for the following:       Result Value   Glucose, Bld 124 (*)    Albumin 5.2 (*)    All other components within normal limits  CBC WITH DIFFERENTIAL/PLATELET - Abnormal; Notable for the following:    WBC 12.9 (*)    MCHC 36.1 (*)    Neutro Abs 11.8 (*)    Lymphs Abs 0.6 (*)    All other components within normal limits  URINALYSIS, ROUTINE W REFLEX MICROSCOPIC - Abnormal; Notable for the following:    Specific Gravity, Urine 1.036 (*)    Ketones, ur 5 (*)    All other components within normal limits  I-STAT CG4 LACTIC ACID, ED - Abnormal; Notable for the following:    Lactic Acid, Venous 2.28 (*)    All other components within normal limits  LIPASE, BLOOD  LACTIC ACID, PLASMA   ____________________________________________  RADIOLOGY  Ct Abdomen Pelvis W Contrast  Result Date: 07/24/2016 CLINICAL DATA:  Nausea, vomiting and diarrhea onset today. History of chronic kidney disease, complains of dark urine and pain and bladder. Left lower quadrant pain. EXAM: CT ABDOMEN AND PELVIS WITH CONTRAST TECHNIQUE: Multidetector CT imaging of the abdomen and pelvis was performed using the standard protocol following bolus administration of intravenous contrast. CONTRAST:  80mL  ISOVUE-300 IOPAMIDOL (ISOVUE-300) INJECTION 61% COMPARISON:  None. FINDINGS: Lower chest: No acute abnormality. Hepatobiliary: No focal liver abnormality is seen. No gallstones, gallbladder wall thickening, or biliary dilatation. Pancreas: Unremarkable. No pancreatic ductal dilatation or surrounding inflammatory changes. Spleen: Normal in size without focal abnormality. Adrenals/Urinary Tract: Adrenal glands appear normal. Patient is status post right nephrectomy. 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 No perinephric fluid. No ureteral or bladder calculi. Bladder is unremarkable, partially decompressed. Stomach/Bowel: Bowel is normal in caliber. Fluid is seen throughout the majority of the nondistended small and large bowel. Stomach appears normal. Appendix is normal, containing air throughout. Vascular/Lymphatic: No significant vascular findings are present. No enlarged lymph nodes are identified, however, there are numerous small and moderate-sized lymph nodes throughout the central mesentery, right lower quadrant and upper periaortic retroperitoneum which raises the possibility of a mesenteric adenitis. Reproductive: Unremarkable. Other: No free fluid or abscess collection. No free intraperitoneal air. Musculoskeletal: No acute or significant osseous abnormality. Superficial soft tissues of the abdomen and pelvis are  unremarkable. IMPRESSION: 1. Fluid throughout the majority of the nondistended large and small bowel. This can be an indication of underlying enteritis or enterocolitis. 2. Numerous small and moderate-sized lymph nodes within the central mesentery, right lower quadrant and upper periaortic retroperitoneum. This raises the possibility of an associated mesenteric adenitis. In the absence of clinical or laboratory findings suggesting lymphoproliferative disorder, would consider follow-up CT in 3 months to ensure stability or  resolution. 3. No acute abnormality of the left kidney. No renal or ureteral calculi. No perinephric inflammation. Right kidney is surgically absent. Electronically Signed   By: Bary Richard M.D.   On: 07/24/2016 20:15    ____________________________________________   PROCEDURES  Procedure(s) performed:   Procedures  None ____________________________________________   INITIAL IMPRESSION / ASSESSMENT AND PLAN / ED COURSE  Pertinent labs & imaging results that were available during my care of the patient were reviewed by me and considered in my medical decision making (see chart for details).  Patient presents to the emergency department for evaluation of acute onset abdominal pain, vomiting, and diarrhea. The patient's abdominal exam is consistent with left lower quadrant focal tenderness with positive guarding but no rebound discomfort. Patient has tachycardia to the 130s with normal blood pressure. No respiratory symptoms to increase suspicion for flu. Plan for IV fluids and CT scan of the abdomen and pelvis with a focal left lower quadrant pain.   11:18 PM Patient with down-trending HR with 3L IVF and lactate that has returned to normal. Normal UA. CT with no focal process and consistent with enteritis. No other flu-like symptoms to suggest flu as patient only has vomiting and diarrhea. He is tolerating PO fluids here without difficulty. Discussed that the patient most likely has a viral infection, possibly Norovirus, and will need to run its course. Considered other SBI possibilities but patient with no obvious infection source other than GI and is clinically improving. Discussed return precautions in detail with patient and mom at bedside.  ____________________________________________  FINAL CLINICAL IMPRESSION(S) / ED DIAGNOSES  Final diagnoses:  Nausea vomiting and diarrhea  Dehydration     MEDICATIONS GIVEN DURING THIS VISIT:  Medications  sodium chloride 0.9 % bolus  2,000 mL (0 mLs Intravenous Stopped 07/24/16 2024)  morphine 4 MG/ML injection 4 mg (4 mg Intravenous Given 07/24/16 1906)  promethazine (PHENERGAN) injection 12.5 mg ( Intravenous Given 07/24/16 2012)  iopamidol (ISOVUE-300) 61 % injection 100 mL (80 mLs Intravenous Contrast Given 07/24/16 1956)  sodium chloride 0.9 % bolus 1,000 mL (0 mLs Intravenous Stopped 07/24/16 2235)  acetaminophen (TYLENOL) tablet 1,000 mg (1,000 mg Oral Given 07/24/16 2127)  ibuprofen (ADVIL,MOTRIN) tablet 600 mg (600 mg Oral Given 07/24/16 2332)     NEW OUTPATIENT MEDICATIONS STARTED DURING THIS VISIT:  Discharge Medication List as of 07/24/2016 11:21 PM    START taking these medications   Details  loperamide (IMODIUM) 2 MG capsule Take 1 capsule (2 mg total) by mouth 4 (four) times daily as needed for diarrhea or loose stools., Starting Tue 07/24/2016, Print    ondansetron (ZOFRAN ODT) 4 MG disintegrating tablet Take 1 tablet (4 mg total) by mouth every 8 (eight) hours as needed for nausea or vomiting., Starting Tue 07/24/2016, Print        Note:  This document was prepared using Dragon voice recognition software and may include unintentional dictation errors.  Alona Bene, MD Emergency Medicine   Maia Plan, MD 07/25/16 1048

## 2016-07-24 NOTE — Discharge Instructions (Signed)

## 2016-07-24 NOTE — ED Notes (Signed)
Provide Sprite with permission from provider.

## 2016-07-24 NOTE — ED Notes (Signed)
Bed: WA22 Expected date:  Expected time:  Means of arrival:  Comments: EMS 

## 2016-07-24 NOTE — ED Notes (Signed)
Informed provider of patients temperature. Provider reports he will come assess patient.

## 2016-07-24 NOTE — ED Triage Notes (Signed)
Per EMS pt from home NVD onset 1 pm today. Pt has chronic kidney disease, c/o dark urine and pain in bladder. Denies abdominal pain. Temp 100.6 and given 1g tylenol en route. 20G RAC 4mg  zofran and 500CC NS also given en route.

## 2016-07-25 ENCOUNTER — Encounter: Payer: Self-pay | Admitting: Family Medicine

## 2016-07-27 ENCOUNTER — Ambulatory Visit (INDEPENDENT_AMBULATORY_CARE_PROVIDER_SITE_OTHER): Payer: BLUE CROSS/BLUE SHIELD | Admitting: Family Medicine

## 2016-07-27 ENCOUNTER — Encounter: Payer: Self-pay | Admitting: Family Medicine

## 2016-07-27 VITALS — BP 124/62 | HR 81 | Temp 98.3°F | Ht 73.0 in | Wt 171.4 lb

## 2016-07-27 DIAGNOSIS — A084 Viral intestinal infection, unspecified: Secondary | ICD-10-CM

## 2016-07-27 DIAGNOSIS — R590 Localized enlarged lymph nodes: Secondary | ICD-10-CM

## 2016-07-27 NOTE — Progress Notes (Signed)
Pre visit review using our clinic review tool, if applicable. No additional management support is needed unless otherwise documented below in the visit note. 

## 2016-07-27 NOTE — Progress Notes (Signed)
Subjective:  James Matthews is a 21 y.o. year old very pleasant male patient who presents for/with See problem oriented charting ROS- mild headache more persistent than normal. No fever in over 24 hours. Diarrhea within 24 hours. No vomiting in several days. Nausea but zofran improves   Past Medical History-  Patient Active Problem List   Diagnosis Date Noted  . Major depressive disorder, recurrent episode, moderate (HCC) 06/04/2016    Priority: High  . Verbal apraxia     Priority: High  . Posterior urethral valves (PUV) 06/04/2016    Priority: Medium  . Essential hypertension 06/04/2016    Priority: Medium  . Bladder spasms 06/04/2016    Priority: Medium  . GAD (generalized anxiety disorder) 06/04/2016    Priority: Medium  . Chronic kidney disease, stage I 06/04/2016    Priority: Medium  . GERD (gastroesophageal reflux disease) 06/04/2016    Priority: Low  . Premature birth     Priority: Low  . IBS (irritable bowel syndrome)     Priority: Low    Medications- reviewed and updated Current Outpatient Prescriptions  Medication Sig Dispense Refill  . ALPRAZolam (XANAX) 0.5 MG tablet Take 1 tablet (0.5 mg total) by mouth 2 (two) times daily as needed for anxiety. (Patient taking differently: Take 0.25 mg by mouth 2 (two) times daily as needed for anxiety. ) 30 tablet 0  . ALPRAZolam (XANAX) 1 MG tablet TAKE 1 TABLET BY MOUTH TWICE A DAY AS NEEDED FOR ANXIETY  0  . escitalopram (LEXAPRO) 20 MG tablet Take 0.5 tablets (10 mg total) by mouth daily. 30 tablet 2  . loperamide (IMODIUM) 2 MG capsule Take 1 capsule (2 mg total) by mouth 4 (four) times daily as needed for diarrhea or loose stools. 12 capsule 0  . losartan (COZAAR) 25 MG tablet Take 50 mg by mouth daily.     . Melatonin 5 MG TABS Take 1 tablet by mouth at bedtime.    . Multiple Vitamins-Minerals (MULTIVITAMIN ADULT PO) Take 1 tablet by mouth daily.    . ondansetron (ZOFRAN ODT) 4 MG disintegrating tablet Take 1  tablet (4 mg total) by mouth every 8 (eight) hours as needed for nausea or vomiting. 20 tablet 0  . Probiotic Product (PROBIOTIC PO) Take 1 capsule by mouth daily.    . TOLTERODINE TARTRATE ER PO Take 2 mg by mouth daily.     No current facility-administered medications for this visit.     Objective: BP 124/62 (BP Location: Left Arm, Patient Position: Sitting, Cuff Size: Normal)   Pulse 81   Temp 98.3 F (36.8 C) (Oral)   Ht 6\' 1"  (1.854 m)   Wt 171 lb 6.4 oz (77.7 kg)   SpO2 99%   BMI 22.61 kg/m  Gen:  appears fatigued Mildly dry mucus membranes CV: RRR no murmurs rubs or gallops Lungs: CTAB no crackles, wheeze, rhonchi Abdomen: soft/nontender/nondistended/normal bowel sounds. No rebound or guarding.  Ext: no edema Skin: warm, dry  Ct Abdomen Pelvis W Contrast  Result Date: 07/24/2016 CLINICAL DATA:  Nausea, vomiting and diarrhea onset today. History of chronic kidney disease, complains of dark urine and pain and bladder. Left lower quadrant pain. EXAM: CT ABDOMEN AND PELVIS WITH CONTRAST TECHNIQUE: Multidetector CT imaging of the abdomen and pelvis was performed using the standard protocol following bolus administration of intravenous contrast. CONTRAST:  80mL ISOVUE-300 IOPAMIDOL (ISOVUE-300) INJECTION 61% COMPARISON:  None. FINDINGS: Lower chest: No acute abnormality. Hepatobiliary: No focal liver abnormality is seen. No  gallstones, gallbladder wall thickening, or biliary dilatation. Pancreas: Unremarkable. No pancreatic ductal dilatation or surrounding inflammatory changes. Spleen: Normal in size without focal abnormality. Adrenals/Urinary Tract: Adrenal glands appear normal. Patient is status post right nephrectomy. 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 No perinephric fluid. No ureteral or bladder calculi. Bladder is unremarkable, partially decompressed. Stomach/Bowel: Bowel is normal in caliber. Fluid is  seen throughout the majority of the nondistended small and large bowel. Stomach appears normal. Appendix is normal, containing air throughout. Vascular/Lymphatic: No significant vascular findings are present. No enlarged lymph nodes are identified, however, there are numerous small and moderate-sized lymph nodes throughout the central mesentery, right lower quadrant and upper periaortic retroperitoneum which raises the possibility of a mesenteric adenitis. Reproductive: Unremarkable. Other: No free fluid or abscess collection. No free intraperitoneal air. Musculoskeletal: No acute or significant osseous abnormality. Superficial soft tissues of the abdomen and pelvis are unremarkable. IMPRESSION: 1. Fluid throughout the majority of the nondistended large and small bowel. This can be an indication of underlying enteritis or enterocolitis. 2. Numerous small and moderate-sized lymph nodes within the central mesentery, right lower quadrant and upper periaortic retroperitoneum. This raises the possibility of an associated mesenteric adenitis. In the absence of clinical or laboratory findings suggesting lymphoproliferative disorder, would consider follow-up CT in 3 months to ensure stability or resolution. 3. No acute abnormality of the left kidney. No renal or ureteral calculi. No perinephric inflammation. Right kidney is surgically absent. Electronically Signed   By: Bary Richard M.D.   On: 07/24/2016 20:15    Assessment/Plan:  Viral gastroenteritis S: Patient with severe case of gastroenteritis. Ended up in ED 3 days ago after having severe diarrhea/vomiting for at least a day. Required 3 liters of fluid in the emergency room. Thoguht to be likely norovirus as have been several cases locally. He also had cramping abdominal pain and CT abd/pelvis with contrast was done which whoed likely enteritis with lymphadenopathy.   He last had vomiting (nonbilious, nonbloody) on day of ER trip. Last diarrhea just under 24  hours. Last fever over 24 hours. Mild headache that is persistent more than his otherheadaches in past- had been more severe but has improved A/P: improving but still mildly dehydrated. Very concerned about his unilateral kidney- we reviewed that creatinine was largely stable even in midst of his GI issues and now much improved hydration and likely kidneys stable- he prefers not to repeat at this time.Continue to push fluids. Mother with him also today. HA likely due to recent viral illness- if does not improve within 2 weeks- advised follow up.also may be due to mild dehydration (had at least 4 bottles h20 today 16 oz and he is to continue this)  Mesenteric lymphadenopathy During time of likely norovirus on CT. Repeat 4/16/8 or later for resolution. Patient due to his concern about unilateral kidney would prefer noncontrast. We discussed this may limit interpretation but he would prefer to start here.   Has follow up for depression planned so will plan on repeating scan at one of these visits  Return precautions advised.  Tana Conch, MD

## 2016-07-27 NOTE — Assessment & Plan Note (Addendum)
During time of likely norovirus on CT. Repeat 4/16/8 or later for resolution. Patient due to his concern about unilateral kidney would prefer noncontrast. We discussed this may limit interpretation but he would prefer to start here.

## 2016-07-27 NOTE — Patient Instructions (Signed)
Keep pushing fluids  Reach out to me in 3 months and we will plan on setting up CT without contrast. We will probably be in touch about other issues at that time

## 2016-07-31 ENCOUNTER — Ambulatory Visit: Payer: Self-pay | Admitting: Family Medicine

## 2016-07-31 ENCOUNTER — Ambulatory Visit (INDEPENDENT_AMBULATORY_CARE_PROVIDER_SITE_OTHER): Payer: BLUE CROSS/BLUE SHIELD | Admitting: Psychology

## 2016-07-31 DIAGNOSIS — F341 Dysthymic disorder: Secondary | ICD-10-CM | POA: Diagnosis not present

## 2016-07-31 DIAGNOSIS — F064 Anxiety disorder due to known physiological condition: Secondary | ICD-10-CM

## 2016-07-31 DIAGNOSIS — R194 Change in bowel habit: Secondary | ICD-10-CM | POA: Diagnosis not present

## 2016-07-31 DIAGNOSIS — R1012 Left upper quadrant pain: Secondary | ICD-10-CM | POA: Diagnosis not present

## 2016-07-31 DIAGNOSIS — I880 Nonspecific mesenteric lymphadenitis: Secondary | ICD-10-CM | POA: Diagnosis not present

## 2016-08-03 ENCOUNTER — Ambulatory Visit: Payer: BLUE CROSS/BLUE SHIELD | Admitting: Psychology

## 2016-08-14 ENCOUNTER — Encounter: Payer: Self-pay | Admitting: Family Medicine

## 2016-08-14 ENCOUNTER — Ambulatory Visit (INDEPENDENT_AMBULATORY_CARE_PROVIDER_SITE_OTHER): Payer: BLUE CROSS/BLUE SHIELD | Admitting: Family Medicine

## 2016-08-14 DIAGNOSIS — K589 Irritable bowel syndrome without diarrhea: Secondary | ICD-10-CM

## 2016-08-14 NOTE — Progress Notes (Signed)
Pre visit review using our clinic review tool, if applicable. No additional management support is needed unless otherwise documented below in the visit note. 

## 2016-08-14 NOTE — Patient Instructions (Signed)
Would not make any changes today. Would continue probiotic as seems like things are slowly getting better.   Can return to me or Dr. [name] if not continuing to improve over the next few weeks.   I think your stomach bug has irritated your IBS and constipation is a reason for some of your discomfort.   Could try for bloating something like gas-x

## 2016-08-14 NOTE — Progress Notes (Signed)
Subjective:  James Matthews is a 21 y.o. year old very pleasant male patient who presents for/with See problem oriented charting ROS- no fever, chills, unintentional weight loss, has been sweating more than usual. Some abdominal pain and change in bowel habits as noted below  Past Medical History-  Patient Active Problem List   Diagnosis Date Noted  . Mesenteric lymphadenopathy 07/27/2016    Priority: High  . Major depressive disorder, recurrent episode, moderate (HCC) 06/04/2016    Priority: High  . Verbal apraxia     Priority: High  . Posterior urethral valves (PUV) 06/04/2016    Priority: Medium  . Essential hypertension 06/04/2016    Priority: Medium  . Bladder spasms 06/04/2016    Priority: Medium  . GAD (generalized anxiety disorder) 06/04/2016    Priority: Medium  . Chronic kidney disease, stage I 06/04/2016    Priority: Medium  . GERD (gastroesophageal reflux disease) 06/04/2016    Priority: Low  . Premature birth     Priority: Low  . IBS (irritable bowel syndrome)     Priority: Low    Medications- reviewed and updated Current Outpatient Prescriptions  Medication Sig Dispense Refill  . ALPRAZolam (XANAX) 0.5 MG tablet Take 1 tablet (0.5 mg total) by mouth 2 (two) times daily as needed for anxiety. (Patient taking differently: Take 0.25 mg by mouth 2 (two) times daily as needed for anxiety. ) 30 tablet 0  . ALPRAZolam (XANAX) 1 MG tablet TAKE 1 TABLET BY MOUTH TWICE A DAY AS NEEDED FOR ANXIETY  0  . escitalopram (LEXAPRO) 20 MG tablet Take 0.5 tablets (10 mg total) by mouth daily. 30 tablet 2  . loperamide (IMODIUM) 2 MG capsule Take 1 capsule (2 mg total) by mouth 4 (four) times daily as needed for diarrhea or loose stools. 12 capsule 0  . losartan (COZAAR) 25 MG tablet Take 50 mg by mouth daily.     . Melatonin 5 MG TABS Take 1 tablet by mouth at bedtime.    . Multiple Vitamins-Minerals (MULTIVITAMIN ADULT PO) Take 1 tablet by mouth daily.    . ondansetron  (ZOFRAN ODT) 4 MG disintegrating tablet Take 1 tablet (4 mg total) by mouth every 8 (eight) hours as needed for nausea or vomiting. 20 tablet 0  . Probiotic Product (PROBIOTIC PO) Take 1 capsule by mouth daily.    . TOLTERODINE TARTRATE ER PO Take 2 mg by mouth daily.     No current facility-administered medications for this visit.     Objective: BP 132/72 (BP Location: Left Arm, Patient Position: Sitting, Cuff Size: Large)   Pulse 90   Temp 98.5 F (36.9 C) (Oral)   Ht 6\' 1"  (1.854 m)   Wt 172 lb 9.6 oz (78.3 kg)   SpO2 98%   BMI 22.77 kg/m  Gen: NAD, resting comfortably, Well-appearing CV: RRR no murmurs rubs or gallops Lungs: CTAB no crackles, wheeze, rhonchi Abdomen: soft/mildly tender to deep palpation in left lower quadrant/ nondistended/normal bowel sounds. No rebound or guarding.  Ext: no edema Skin: warm, dry, no rash  Assessment/Plan:   IBS (irritable bowel syndrome) S: after Gi bug resolved- has had bloating. Stools are either sticky or clumps of stool. Does get some LLQ pain when he bloats- resolves with bowel movement. . Has not tried anything like gas-x or beano. He is already on probiotic starting 2 weeks ago- Dr. Loreta Ave. BM every 3 days now down to every 2. Symptoms are slowly improving  He also mentions some  Night sweats in the last week or so. He has also felt More sweaty in daytime.  Since lower abdomen pain- did urine culture with Dr. [name] and negative A/P: 21 year old male with history of irritable bowel syndrome presenting with decreased frequency of bowel movements, abdominal bloating, left lower quadrant abdominal pain after recent viral gastroenteritis. We discussed is likely a function of his irritable bowel syndrome especially given pain improves with bowel movement. Constipation may be the driver for this discomfort-but would not want to treat this as may reverse and cause more extreme diarrhea he has had in the past. He was concerned about inflammatory  bowel disorder but has had no blood in stool and no anemia-we discussed this was probably unlikely. He already follows up with gastroenterology and we discussed he can mention this to Dr. [name]. He is already on a probiotic per Dr. [name] and he will continue this. We discussed continuing to monitor given that he is making improvement. For his bloating he may trial simethicone.  His increased sweating is new- we discussed if persists over the next month to follow-up and would consider some repeat labs to include a thyroid test.   The duration of face-to-face time during this visit was greater than 15 minutes. Greater than 50% of this time was spent in counseling about patient's concerns about potential causes and also discussing return precautions.   Tana ConchStephen Zyara Riling, MD

## 2016-08-14 NOTE — Assessment & Plan Note (Addendum)
S: after Gi bug resolved- has had bloating. Stools are either sticky or clumps of stool. Does get some LLQ pain when he bloats- resolves with bowel movement. . Has not tried anything like gas-x or beano. He is already on probiotic starting 2 weeks ago- Dr. [name]. BM every 3 days now down to every 2. Symptoms are slowly improving  He also mentions some Night sweats in the last week or so. He has also felt More sweaty in daytime.  Since lower abdomen pain- did urine culture with Dr. [name] and negative A/P: 21 year old male with history of irritable bowel syndrome presenting with decreased frequency of bowel movements, abdominal bloating, left lower quadrant abdominal pain after recent viral gastroenteritis. We discussed is likely a function of his irritable bowel syndrome especially given pain improves with bowel movement. Constipation may be the driver for this discomfort-but would not want to treat this as may reverse and cause more extreme diarrhea he has had in the past. He was concerned about inflammatory bowel disorder but has had no blood in stool and no anemia-we discussed this was probably unlikely. He already follows up with gastroenterology and we discussed he can mention this to Dr. [name]. He is already on a probiotic per Dr. [name] and he will continue this. We discussed continuing to monitor given that he is making improvement. For his bloating he may trial simethicone.  His increased sweating is new- we discussed if persists over the next month to follow-up and would consider some repeat labs to include a thyroid test.

## 2016-08-28 ENCOUNTER — Ambulatory Visit (INDEPENDENT_AMBULATORY_CARE_PROVIDER_SITE_OTHER): Payer: BLUE CROSS/BLUE SHIELD | Admitting: Psychology

## 2016-08-28 DIAGNOSIS — F418 Other specified anxiety disorders: Secondary | ICD-10-CM

## 2016-08-28 DIAGNOSIS — F341 Dysthymic disorder: Secondary | ICD-10-CM

## 2016-08-28 DIAGNOSIS — F064 Anxiety disorder due to known physiological condition: Secondary | ICD-10-CM | POA: Diagnosis not present

## 2016-08-31 ENCOUNTER — Encounter: Payer: Self-pay | Admitting: Family Medicine

## 2016-08-31 ENCOUNTER — Ambulatory Visit: Payer: Self-pay | Admitting: Family Medicine

## 2016-08-31 ENCOUNTER — Ambulatory Visit (INDEPENDENT_AMBULATORY_CARE_PROVIDER_SITE_OTHER): Payer: BLUE CROSS/BLUE SHIELD | Admitting: Family Medicine

## 2016-08-31 VITALS — BP 118/78 | HR 92 | Temp 98.3°F | Ht 73.0 in | Wt 169.0 lb

## 2016-08-31 DIAGNOSIS — F331 Major depressive disorder, recurrent, moderate: Secondary | ICD-10-CM | POA: Diagnosis not present

## 2016-08-31 DIAGNOSIS — R309 Painful micturition, unspecified: Secondary | ICD-10-CM

## 2016-08-31 LAB — POC URINALSYSI DIPSTICK (AUTOMATED)
Bilirubin, UA: NEGATIVE
Blood, UA: NEGATIVE
Glucose, UA: NEGATIVE
Ketones, UA: NEGATIVE
Leukocytes, UA: NEGATIVE
Nitrite, UA: NEGATIVE
Spec Grav, UA: 1.01
Urobilinogen, UA: 0.2
pH, UA: 7

## 2016-08-31 NOTE — Progress Notes (Signed)
Pre visit review using our clinic review tool, if applicable. No additional management support is needed unless otherwise documented below in the visit note. 

## 2016-08-31 NOTE — Assessment & Plan Note (Addendum)
Compliant with lexapro 20mg . He states depression not drastically improved but has seen fair amount of improvement with lexapro 20mg  for anxiety compared to 10mg  dose. Saw Dr. Jason FilaBray recently. We discussed change or increase in medication- he would prefer to continue to work with Dr. Jason FilaBray and follow up 6-8 weeks. No SI

## 2016-08-31 NOTE — Progress Notes (Signed)
Subjective:  James Matthews is a 21 y.o. year old very pleasant male patient who presents for/with See problem oriented charting ROS- no SI. Admits to anxiety but improved. Still with depressed mood. Dysuria but no clear polyuria   Past Medical History-  Patient Active Problem List   Diagnosis Date Noted  . Mesenteric lymphadenopathy 07/27/2016    Priority: High  . Major depressive disorder, recurrent episode, moderate (HCC) 06/04/2016    Priority: High  . Verbal apraxia     Priority: High  . Posterior urethral valves (PUV) 06/04/2016    Priority: Medium  . Essential hypertension 06/04/2016    Priority: Medium  . Bladder spasms 06/04/2016    Priority: Medium  . GAD (generalized anxiety disorder) 06/04/2016    Priority: Medium  . Chronic kidney disease, stage I 06/04/2016    Priority: Medium  . GERD (gastroesophageal reflux disease) 06/04/2016    Priority: Low  . Premature birth     Priority: Low  . IBS (irritable bowel syndrome)     Priority: Low    Medications- reviewed and updated Current Outpatient Prescriptions  Medication Sig Dispense Refill  . ALPRAZolam (XANAX) 0.5 MG tablet Take 1 tablet (0.5 mg total) by mouth 2 (two) times daily as needed for anxiety. (Patient taking differently: Take 0.25 mg by mouth 2 (two) times daily as needed for anxiety. ) 30 tablet 0  . ALPRAZolam (XANAX) 1 MG tablet TAKE 1 TABLET BY MOUTH TWICE A DAY AS NEEDED FOR ANXIETY  0  . escitalopram (LEXAPRO) 20 MG tablet Take 0.5 tablets (10 mg total) by mouth daily. 30 tablet 2  . loperamide (IMODIUM) 2 MG capsule Take 1 capsule (2 mg total) by mouth 4 (four) times daily as needed for diarrhea or loose stools. 12 capsule 0  . losartan (COZAAR) 25 MG tablet Take 50 mg by mouth daily.     . Melatonin 5 MG TABS Take 1 tablet by mouth at bedtime.    . Multiple Vitamins-Minerals (MULTIVITAMIN ADULT PO) Take 1 tablet by mouth daily.    . ondansetron (ZOFRAN ODT) 4 MG disintegrating tablet Take 1  tablet (4 mg total) by mouth every 8 (eight) hours as needed for nausea or vomiting. 20 tablet 0  . Probiotic Product (PROBIOTIC PO) Take 1 capsule by mouth daily.    . TOLTERODINE TARTRATE ER PO Take 2 mg by mouth daily.     No current facility-administered medications for this visit.     Objective: BP 118/78 (BP Location: Left Arm, Patient Position: Sitting, Cuff Size: Large)   Pulse 92   Temp 98.3 F (36.8 C) (Oral)   Ht 6\' 1"  (1.854 m)   Wt 169 lb (76.7 kg)   SpO2 97%   BMI 22.30 kg/m  Gen: NAD, resting comfortably CV: RRR no murmurs rubs or gallops Lungs: CTAB no crackles, wheeze, rhonchi Abdomen: soft/nontender except very mild suprapubic pain/nondistended/normal bowel. sounds. No rebound or guarding.  No cva tenderness Ext: no edema Skin: warm, dry Apraxia noted  GU: rectal exam without tender or boggy prostate  Assessment/Plan:  Urinary pain - Plan: POCT Urinalysis Dipstick (Automated), Urine culture S: Dysuria  starting Tuesday or Wednesday. Also seems somewhat foamy. Some odor. No penile discharge. No fever /chills. Some mild lower abdominal pain. At baseline has been instructed to once an hour in daytime so not sure if increased frequency. Last UTI around age 54 or 63.  A/P: UA largely reassuring except 1+ protein which may be where diagnosis CKD  I comes from. I do not have prior for comparison. We opted to get urine culture. If culture negative- will return for STD testing (though states no sex in last year and prior to that only had protected sex). Doubt prostatitis with nontender prostate  Major depressive disorder, recurrent episode, moderate (HCC) Compliant with lexapro 20mg . He states depression not drastically improved but has seen fair amount of improvement with lexapro 20mg  for anxiety compared to 10mg  dose. Saw Dr. Jason FilaBray recently. We discussed change or increase in medication- he would prefer to continue to work with Dr. Jason FilaBray and follow up 6-8 weeks.   Does  feel like sweating more than usual still- will follow up next visit  Orders Placed This Encounter  Procedures  . Urine culture    [facility]  . POCT Urinalysis Dipstick (Automated)   Return precautions advised.  Tana ConchStephen Lesleyann Fichter, MD

## 2016-08-31 NOTE — Patient Instructions (Signed)
Initial urine is pretty normal  We will get a culture to make sure no infection in bladder  If this is negative- would have you back for STD testing just to be on safe side  If all above negative- would have you see Dr. Jean RosenthalPirkle

## 2016-09-03 LAB — URINE CULTURE

## 2016-09-04 ENCOUNTER — Ambulatory Visit (INDEPENDENT_AMBULATORY_CARE_PROVIDER_SITE_OTHER): Payer: BLUE CROSS/BLUE SHIELD | Admitting: Psychology

## 2016-09-04 DIAGNOSIS — F064 Anxiety disorder due to known physiological condition: Secondary | ICD-10-CM | POA: Diagnosis not present

## 2016-09-04 DIAGNOSIS — F341 Dysthymic disorder: Secondary | ICD-10-CM | POA: Diagnosis not present

## 2016-09-05 DIAGNOSIS — R399 Unspecified symptoms and signs involving the genitourinary system: Secondary | ICD-10-CM | POA: Diagnosis not present

## 2016-09-11 ENCOUNTER — Ambulatory Visit (INDEPENDENT_AMBULATORY_CARE_PROVIDER_SITE_OTHER): Payer: BLUE CROSS/BLUE SHIELD | Admitting: Psychology

## 2016-09-11 ENCOUNTER — Encounter: Payer: Self-pay | Admitting: Family Medicine

## 2016-09-11 DIAGNOSIS — F341 Dysthymic disorder: Secondary | ICD-10-CM | POA: Diagnosis not present

## 2016-09-11 DIAGNOSIS — F064 Anxiety disorder due to known physiological condition: Secondary | ICD-10-CM | POA: Diagnosis not present

## 2016-09-18 ENCOUNTER — Ambulatory Visit (INDEPENDENT_AMBULATORY_CARE_PROVIDER_SITE_OTHER): Payer: BLUE CROSS/BLUE SHIELD | Admitting: Psychology

## 2016-09-18 DIAGNOSIS — F064 Anxiety disorder due to known physiological condition: Secondary | ICD-10-CM | POA: Diagnosis not present

## 2016-09-18 DIAGNOSIS — K602 Anal fissure, unspecified: Secondary | ICD-10-CM | POA: Diagnosis not present

## 2016-09-18 DIAGNOSIS — F341 Dysthymic disorder: Secondary | ICD-10-CM | POA: Diagnosis not present

## 2016-09-18 DIAGNOSIS — R194 Change in bowel habit: Secondary | ICD-10-CM | POA: Diagnosis not present

## 2016-09-25 ENCOUNTER — Ambulatory Visit (INDEPENDENT_AMBULATORY_CARE_PROVIDER_SITE_OTHER): Payer: BLUE CROSS/BLUE SHIELD | Admitting: Psychology

## 2016-09-25 DIAGNOSIS — F341 Dysthymic disorder: Secondary | ICD-10-CM

## 2016-09-25 DIAGNOSIS — F064 Anxiety disorder due to known physiological condition: Secondary | ICD-10-CM | POA: Diagnosis not present

## 2016-09-26 ENCOUNTER — Telehealth (HOSPITAL_COMMUNITY): Payer: Self-pay | Admitting: Family Medicine

## 2016-10-02 ENCOUNTER — Ambulatory Visit (INDEPENDENT_AMBULATORY_CARE_PROVIDER_SITE_OTHER): Payer: BLUE CROSS/BLUE SHIELD | Admitting: Psychology

## 2016-10-02 DIAGNOSIS — F341 Dysthymic disorder: Secondary | ICD-10-CM | POA: Diagnosis not present

## 2016-10-02 DIAGNOSIS — F064 Anxiety disorder due to known physiological condition: Secondary | ICD-10-CM

## 2016-10-09 ENCOUNTER — Ambulatory Visit (INDEPENDENT_AMBULATORY_CARE_PROVIDER_SITE_OTHER): Payer: BLUE CROSS/BLUE SHIELD | Admitting: Psychology

## 2016-10-09 DIAGNOSIS — F064 Anxiety disorder due to known physiological condition: Secondary | ICD-10-CM | POA: Diagnosis not present

## 2016-10-09 DIAGNOSIS — F341 Dysthymic disorder: Secondary | ICD-10-CM | POA: Diagnosis not present

## 2016-10-10 DIAGNOSIS — F4010 Social phobia, unspecified: Secondary | ICD-10-CM | POA: Diagnosis not present

## 2016-10-12 ENCOUNTER — Ambulatory Visit (INDEPENDENT_AMBULATORY_CARE_PROVIDER_SITE_OTHER): Payer: BLUE CROSS/BLUE SHIELD | Admitting: Family Medicine

## 2016-10-12 ENCOUNTER — Encounter: Payer: Self-pay | Admitting: Family Medicine

## 2016-10-12 VITALS — BP 128/68 | HR 78 | Temp 97.9°F | Wt 172.2 lb

## 2016-10-12 DIAGNOSIS — F331 Major depressive disorder, recurrent, moderate: Secondary | ICD-10-CM | POA: Diagnosis not present

## 2016-10-12 DIAGNOSIS — N3000 Acute cystitis without hematuria: Secondary | ICD-10-CM | POA: Diagnosis not present

## 2016-10-12 DIAGNOSIS — N3289 Other specified disorders of bladder: Secondary | ICD-10-CM | POA: Diagnosis not present

## 2016-10-12 DIAGNOSIS — R590 Localized enlarged lymph nodes: Secondary | ICD-10-CM

## 2016-10-12 DIAGNOSIS — I10 Essential (primary) hypertension: Secondary | ICD-10-CM | POA: Diagnosis not present

## 2016-10-12 DIAGNOSIS — F411 Generalized anxiety disorder: Secondary | ICD-10-CM

## 2016-10-12 LAB — POC URINALSYSI DIPSTICK (AUTOMATED)
Bilirubin, UA: NEGATIVE
Blood, UA: NEGATIVE
Glucose, UA: NEGATIVE
Ketones, UA: NEGATIVE
Leukocytes, UA: NEGATIVE
Nitrite, UA: NEGATIVE
Protein, UA: NEGATIVE
Spec Grav, UA: 1.005 (ref 1.030–1.035)
Urobilinogen, UA: 0.2 (ref ?–2.0)
pH, UA: 6 (ref 5.0–8.0)

## 2016-10-12 NOTE — Assessment & Plan Note (Addendum)
GAD S:Found a psychiatrist. Dr. Lowell Guitar.  lexapro being weaned off, Now on luvox  qhs but only for 2 days. No significant improvement yet. Has not noted improvement yet in the Sweating more than usual issue but still on lexapro at present. Xanax  total per day- sometimes takes whole, sometimes uses half and takes it twice  Reports sweating on Outer arms, upper legs- more noticeable at night but sweats more than usual throughout the day.  A/P: No SI. He states not feeling better yet but he is hopeful.  He discussed sweating with psychiatry who also stated thought could be related to SSRI use. He is ok with monitoring for now- perhaps he will do better on luvox

## 2016-10-12 NOTE — Patient Instructions (Addendum)
Please stop by lab before you go  We will call you within a week or two about your referral for repeat CT scan. If you do not hear within 3 weeks, give [place] a call.     WE NOW OFFER   [facility] FAST TRACK!!!  SAME DAY Appointments for ACUTE CARE  Such as: Sprains, Injuries, cuts, abrasions, rashes, muscle pain, joint pain, back pain Colds, flu, sore throats, headache, allergies, cough, fever  Ear pain, sinus and eye infections Abdominal pain, nausea, vomiting, diarrhea, upset stomach Animal/insect bites  3 Easy Ways to Schedule: Walk-In Scheduling Call in scheduling Mychart Sign-up: [email or web address]

## 2016-10-12 NOTE — Progress Notes (Signed)
Subjective:  James Matthews is a 21 y.o. year old very pleasant male patient who presents for/with See problem oriented charting ROS- no Si. No abdominal pain. Does have depressed mood. Some night sweating. No fevers. No unintentional weight loss   Past Medical History-  Patient Active Problem List   Diagnosis Date Noted  . Mesenteric lymphadenopathy 07/27/2016    Priority: High  . Major depressive disorder, recurrent episode, moderate (HCC) 06/04/2016    Priority: High  . Verbal apraxia     Priority: High  . Posterior urethral valves (PUV) 06/04/2016    Priority: Medium  . Essential hypertension 06/04/2016    Priority: Medium  . Bladder spasms 06/04/2016    Priority: Medium  . GAD (generalized anxiety disorder) 06/04/2016    Priority: Medium  . Chronic kidney disease, stage I 06/04/2016    Priority: Medium  . GERD (gastroesophageal reflux disease) 06/04/2016    Priority: Low  . Premature birth     Priority: Low  . IBS (irritable bowel syndrome)     Priority: Low    Medications- reviewed and updated Current Outpatient Prescriptions  Medication Sig Dispense Refill  . ALPRAZolam (XANAX) 0.5 MG tablet Take 1 tablet (0.5 mg total) by mouth 2 (two) times daily as needed for anxiety. (Patient taking differently: Take 0.25 mg by mouth 2 (two) times daily as needed for anxiety. ) 30 tablet 0  . ALPRAZolam (XANAX) 1 MG tablet TAKE 1 TABLET BY MOUTH TWICE A DAY AS NEEDED FOR ANXIETY  0  . fluvoxaMINE (LUVOX) 50 MG tablet Take 50 mg by mouth at bedtime.    Marland Kitchen losartan (COZAAR) 25 MG tablet Take 50 mg by mouth daily.     . Multiple Vitamins-Minerals (MULTIVITAMIN ADULT PO) Take 1 tablet by mouth daily.    . ondansetron (ZOFRAN ODT) 4 MG disintegrating tablet Take 1 tablet (4 mg total) by mouth every 8 (eight) hours as needed for nausea or vomiting. 20 tablet 0  . Probiotic Product (PROBIOTIC PO) Take 1 capsule by mouth daily.    Marland Kitchen loperamide (IMODIUM) 2 MG capsule Take 1 capsule  (2 mg total) by mouth 4 (four) times daily as needed for diarrhea or loose stools. (Patient not taking: Reported on 10/12/2016) 12 capsule 0  . Melatonin 5 MG TABS Take 1 tablet by mouth at bedtime.     No current facility-administered medications for this visit.     Objective: BP 128/68 (BP Location: Left Arm, Patient Position: Sitting, Cuff Size: Normal)   Pulse 78   Temp 97.9 F (36.6 C) (Oral)   Wt 172 lb 3.2 oz (78.1 kg)   SpO2 97%   BMI 22.72 kg/m  Gen: NAD, resting comfortably CV: RRR no murmurs rubs or gallops Lungs: CTAB no crackles, wheeze, rhonchi Abdomen: soft/nontender including suprapubic/nondistended/normal bowel sounds. No rebound or guarding.  Ext: no edema  Assessment/Plan:  Essential hypertension S: controlled still on Losartan down to 50. Dr. Jean Rosenthal had apparently thought he was on  and planned to reduce to 75 or 50 but patient states he has always been on  BP Readings from Last 3 Encounters:  10/12/16 128/68  08/31/16 118/78  08/14/16 132/72  A/P:Continue current meds:  Blood pressure well controlled today  Major depressive disorder, recurrent episode, moderate (HCC) GAD S:Found a psychiatrist. Dr. Lowell Guitar.  lexapro being weaned off, Now on luvox  qhs but only for 2 days. No significant improvement yet. Has not noted improvement yet in the Sweating more than usual  issue but still on lexapro at present. Xanax  total per day- sometimes takes whole, sometimes uses half and takes it twice  Reports sweating on Outer arms, upper legs- more noticeable at night but sweats more than usual throughout the day.  A/P: No SI. He states not feeling better yet but he is hopeful.  He discussed sweating with psychiatry who also stated thought could be related to SSRI use. He is ok with monitoring for now- perhaps he will do better on luvox  Mesenteric lymphadenopathy S: this was noted During time of likely norovirus on CT. Likely could have been reactive.  This was 07/2016 A/P: reorderedct abd/pelvis but no contrast used  Urinary pain/UTI S: we saw patient for urinary pain and no cause found with negative Urine culture and patient declining STD testing. 09/05/16 saw urology-. Urine culture [facility]. Held detrol. 3 months PVR planned.  2 weeks of antibiotics because of white blood cells but culture was negative. Symptoms resolved a week later ( a week after finishing antibiotics). Std testing never did.  A/P: he asks for repeat UA to document lymphocyte clearance- this was done today and normal  PRN planned as psychiatry now managing depression/anxiety  Orders Placed This Encounter  Procedures  . CT Abdomen Pelvis Wo Contrast    Standing Status:   Future    Standing Expiration Date:   01/11/2018    Order Specific Question:   Reason for Exam (SYMPTOM  OR DIAGNOSIS REQUIRED)    Answer:   follow up lymphadenopathy in abdomen. no contrast per nephrology as solitary kidney    Order Specific Question:   Preferred imaging location?    Answer:   [facility]    Order Specific Question:   Radiology Contrast Protocol - do NOT remove file path    Answer:   [email or web address]  . POCT Urinalysis Dipstick (Automated)    Meds ordered this encounter  Medications  . fluvoxaMINE (LUVOX) 50 MG tablet    Sig: Take 50 mg by mouth at bedtime.    Return precautions advised.  Tana Conch, MD

## 2016-10-12 NOTE — Assessment & Plan Note (Signed)
S: controlled still on Losartan down to 50. Dr. Jean Rosenthal had apparently thought he was on  and planned to reduce to 75 or 50 but patient states he has always been on  BP Readings from Last 3 Encounters:  10/12/16 128/68  08/31/16 118/78  08/14/16 132/72  A/P:Continue current meds:  Blood pressure well controlled today

## 2016-10-12 NOTE — Assessment & Plan Note (Signed)
S: this was noted During time of likely norovirus on CT. Likely could have been reactive. This was 07/2016 A/P: reorderedct abd/pelvis but no contrast used

## 2016-10-12 NOTE — Progress Notes (Signed)
Pre visit review using our clinic review tool, if applicable. No additional management support is needed unless otherwise documented below in the visit note. 

## 2016-10-16 ENCOUNTER — Ambulatory Visit (INDEPENDENT_AMBULATORY_CARE_PROVIDER_SITE_OTHER): Payer: BLUE CROSS/BLUE SHIELD | Admitting: Psychology

## 2016-10-16 DIAGNOSIS — F064 Anxiety disorder due to known physiological condition: Secondary | ICD-10-CM

## 2016-10-16 DIAGNOSIS — F341 Dysthymic disorder: Secondary | ICD-10-CM

## 2016-10-23 ENCOUNTER — Ambulatory Visit (INDEPENDENT_AMBULATORY_CARE_PROVIDER_SITE_OTHER): Payer: BLUE CROSS/BLUE SHIELD | Admitting: Psychology

## 2016-10-23 DIAGNOSIS — F064 Anxiety disorder due to known physiological condition: Secondary | ICD-10-CM

## 2016-10-23 DIAGNOSIS — F341 Dysthymic disorder: Secondary | ICD-10-CM | POA: Diagnosis not present

## 2016-10-25 ENCOUNTER — Ambulatory Visit (INDEPENDENT_AMBULATORY_CARE_PROVIDER_SITE_OTHER)
Admission: RE | Admit: 2016-10-25 | Discharge: 2016-10-25 | Disposition: A | Discharge status: Home / Self Care | Payer: BLUE CROSS/BLUE SHIELD | Source: Ambulatory Visit | Attending: Family Medicine | Admitting: Family Medicine

## 2016-10-25 DIAGNOSIS — N189 Chronic kidney disease, unspecified: Secondary | ICD-10-CM | POA: Diagnosis not present

## 2016-10-25 DIAGNOSIS — R509 Fever, unspecified: Secondary | ICD-10-CM | POA: Diagnosis not present

## 2016-10-25 DIAGNOSIS — R590 Localized enlarged lymph nodes: Secondary | ICD-10-CM

## 2016-10-26 ENCOUNTER — Encounter: Payer: Self-pay | Admitting: Family Medicine

## 2016-10-26 ENCOUNTER — Telehealth: Payer: Self-pay | Admitting: Family Medicine

## 2016-10-26 ENCOUNTER — Ambulatory Visit (INDEPENDENT_AMBULATORY_CARE_PROVIDER_SITE_OTHER): Payer: BLUE CROSS/BLUE SHIELD | Admitting: Family Medicine

## 2016-10-26 VITALS — BP 136/66 | HR 116 | Temp 101.0°F | Ht 73.0 in | Wt 174.6 lb

## 2016-10-26 DIAGNOSIS — R509 Fever, unspecified: Secondary | ICD-10-CM | POA: Diagnosis not present

## 2016-10-26 DIAGNOSIS — R590 Localized enlarged lymph nodes: Secondary | ICD-10-CM

## 2016-10-26 LAB — POC INFLUENZA A&B (BINAX/QUICKVUE)
Influenza A, POC: NEGATIVE
Influenza B, POC: POSITIVE — AB

## 2016-10-26 MED ORDER — OSELTAMIVIR PHOSPHATE 75 MG PO CAPS: 75.0000 mg | ORAL_CAPSULE | Freq: Two times a day (BID) | ORAL | 0 refills | Status: DC

## 2016-10-26 NOTE — Progress Notes (Signed)
PCP: Tana Conch, MD  Subjective:  James Matthews is a 21 y.o. year old very pleasant male patient who presents with flu like symptoms including fever,body aches, headache, fatigue, cough, mild shortness of breath, nausea -started: sore throat mild 3 days ago- everything worsened yesterday.  -inside 48 hour treatment window if needed for tamiflu: yes -high risk condition (children <5, adults >65, chronic pulmonary or cardiac condition, immunosuppression, pregnancy, nursing home resident, morbid obesity) : yes only 1 kidney -symptoms are worsening -previous treatments: acetaminophen - patient did receive flu shot this year.  - denies sick contact; specifically influenza: no.   ROS-denies VD, sinus or dental pain. Fever 103 at home.    Pertinent Past Medical History-  Patient Active Problem List   Diagnosis Date Noted  . Mesenteric lymphadenopathy 07/27/2016    Priority: High  . Major depressive disorder, recurrent episode, moderate (HCC) 06/04/2016    Priority: High  . Verbal apraxia     Priority: High  . Posterior urethral valves (PUV) 06/04/2016    Priority: Medium  . Essential hypertension 06/04/2016    Priority: Medium  . Bladder spasms 06/04/2016    Priority: Medium  . GAD (generalized anxiety disorder) 06/04/2016    Priority: Medium  . Chronic kidney disease, stage I 06/04/2016    Priority: Medium  . GERD (gastroesophageal reflux disease) 06/04/2016    Priority: Low  . Premature birth     Priority: Low  . IBS (irritable bowel syndrome)     Priority: Low    Medications- reviewed  Current Outpatient Prescriptions  Medication Sig Dispense Refill  . ALPRAZolam (XANAX) 0.5 MG tablet Take 1 tablet (0.5 mg total) by mouth 2 (two) times daily as needed for anxiety. (Patient taking differently: Take 0.25 mg by mouth 2 (two) times daily as needed for anxiety. ) 30 tablet 0  . ALPRAZolam (XANAX) 1 MG tablet TAKE 1 TABLET BY MOUTH TWICE A DAY AS NEEDED FOR ANXIETY  0   . fluvoxaMINE (LUVOX) 100 MG tablet Take 100 mg by mouth at bedtime.    Marland Kitchen loperamide (IMODIUM) 2 MG capsule Take 1 capsule (2 mg total) by mouth 4 (four) times daily as needed for diarrhea or loose stools. 12 capsule 0  . losartan (COZAAR) 25 MG tablet Take 50 mg by mouth daily.     . Melatonin 5 MG TABS Take 1 tablet by mouth at bedtime.    . Multiple Vitamins-Minerals (MULTIVITAMIN ADULT PO) Take 1 tablet by mouth daily.    . ondansetron (ZOFRAN ODT) 4 MG disintegrating tablet Take 1 tablet (4 mg total) by mouth every 8 (eight) hours as needed for nausea or vomiting. 20 tablet 0  . Probiotic Product (PROBIOTIC PO) Take 1 capsule by mouth daily.     No current facility-administered medications for this visit.     Objective: BP 136/66 (BP Location: Left Arm, Patient Position: Sitting, Cuff Size: Normal)   Pulse (!) 116   Temp (!) 101 F (38.3 C) (Oral)   Ht  (1.854 m)   Wt 174 lb 9.6 oz (79.2 kg)   SpO2 97%   BMI 23.04 kg/m  Gen: NAD, appears fatigued HEENT: Turbinates erythematous, TM normal, pharynx mildly erythematous with no tonsilar exudate or edema, no sinus tenderness CV: RRR no murmurs rubs or gallops Lungs: CTAB no crackles, wheeze, rhonchi Abdomen: soft/nontender/nondistended/normal bowel sounds. Ext: no edema Skin: warm, dry, no rash  Results for orders placed or performed in visit on 10/26/16 (from the past 24  hour(s))  POC Influenza A&B(BINAX/QUICKVUE)     Status: Abnormal   Collection Time: 10/26/16  3:52 PM  Result Value Ref Range   Influenza A, POC Negative Negative   Influenza B, POC Positive (A) Negative    Assessment/Plan:  Influenza History and exam today are suggestive of viral process. Patients influenza test was positive.  Pretest probability of influenza was high. High risk condition: yes  Patient will be treated with Tamiflu. Prophylaxis for other [place] patients: no. Mom will call her daughter Symptomatic treatment with: rest,  hydration  Finally, we reviewed reasons to return to care including if symptoms worsen or persist or new concerns arise. Even on tamiflu can become ill- if you have severe lightheadedness or shortness of breath need to seek care.   Mesenteric lymphadenopathy Unfortunately patient with influenza at time of exam and numerous nodes noted- ? If this was the cause. We agreed to 6-9 month repeat. Also encouraged him to discuss CT results with Dr. Loreta Ave of GI (he was given copy) as well as his urologist for comments on the urological system   Meds ordered this encounter  Medications  . fluvoxaMINE (LUVOX) 100 MG tablet    Sig: Take 100 mg by mouth at bedtime.    Tana Conch, MD

## 2016-10-26 NOTE — Assessment & Plan Note (Signed)
Unfortunately patient with influenza at time of exam and numerous nodes noted- ? If this was the cause. We agreed to 6-9 month repeat. Also encouraged him to discuss CT results with Dr. Loreta Ave of GI (he was given copy) as well as his urologist for comments on the urological system

## 2016-10-26 NOTE — Telephone Encounter (Signed)
James Matthews pt returned your call. °

## 2016-10-26 NOTE — Patient Instructions (Addendum)
Influenza History and exam today are suggestive of viral process. Patients influenza test was positive.  Pretest probability of influenza was high. High risk condition: yes  Patient will be treated with Tamiflu. Prophylaxis for other [facility] patients: no. Mom will call her daughter Symptomatic treatment with: rest, hydration  Finally, we reviewed reasons to return to care including if symptoms worsen or persist or new concerns arise. Even on tamiflu can become ill- if you have severe lightheadedness or shortness of breath need to seek care.   Contagious until no fever for 48 hours  Mesenteric lymph nodes Unfortunately [place] with influenza at time CT of exam and numerous nodes noted- ? If this was the cause. We agreed to 6-9 month repeat. Also encouraged him to discuss CT results with Dr. Loreta Ave of GI (he was given copy) as well as his urologist for comments on the urological system   Meds ordered this encounter  . oseltamivir (TAMIFLU) 75 MG capsule    Sig: Take 1 capsule (75 mg total) by mouth 2 (two) times daily.    Dispense:  10 capsule    Refill:  0

## 2016-10-26 NOTE — Progress Notes (Signed)
Pre visit review using our clinic review tool, if applicable. No additional management support is needed unless otherwise documented below in the visit note. 

## 2016-10-26 NOTE — Telephone Encounter (Signed)
Reasonable to wait until afternoon unless significantly short of breath

## 2016-10-26 NOTE — Telephone Encounter (Signed)
Called and spoke with patient. He states his last temp was 100.1. He states he is experiencing sore throat, fatigue, cough, Shortness of breath, and fever. He denies congestion and cough is nonproductive. Please advise

## 2016-10-30 ENCOUNTER — Ambulatory Visit: Payer: BLUE CROSS/BLUE SHIELD | Admitting: Psychology

## 2016-11-01 DIAGNOSIS — R109 Unspecified abdominal pain: Secondary | ICD-10-CM | POA: Diagnosis not present

## 2016-11-01 DIAGNOSIS — R933 Abnormal findings on diagnostic imaging of other parts of digestive tract: Secondary | ICD-10-CM | POA: Diagnosis not present

## 2016-11-01 DIAGNOSIS — I880 Nonspecific mesenteric lymphadenitis: Secondary | ICD-10-CM | POA: Diagnosis not present

## 2016-11-01 DIAGNOSIS — R630 Anorexia: Secondary | ICD-10-CM | POA: Diagnosis not present

## 2016-11-06 ENCOUNTER — Ambulatory Visit (INDEPENDENT_AMBULATORY_CARE_PROVIDER_SITE_OTHER): Payer: BLUE CROSS/BLUE SHIELD | Admitting: Psychology

## 2016-11-06 ENCOUNTER — Encounter: Payer: Self-pay | Admitting: Family Medicine

## 2016-11-06 DIAGNOSIS — F064 Anxiety disorder due to known physiological condition: Secondary | ICD-10-CM

## 2016-11-06 DIAGNOSIS — K5901 Slow transit constipation: Secondary | ICD-10-CM | POA: Diagnosis not present

## 2016-11-06 DIAGNOSIS — F341 Dysthymic disorder: Secondary | ICD-10-CM | POA: Diagnosis not present

## 2016-11-06 DIAGNOSIS — R61 Generalized hyperhidrosis: Secondary | ICD-10-CM | POA: Diagnosis not present

## 2016-11-07 DIAGNOSIS — F4010 Social phobia, unspecified: Secondary | ICD-10-CM | POA: Diagnosis not present

## 2016-11-08 DIAGNOSIS — N181 Chronic kidney disease, stage 1: Secondary | ICD-10-CM | POA: Diagnosis not present

## 2016-11-08 DIAGNOSIS — K219 Gastro-esophageal reflux disease without esophagitis: Secondary | ICD-10-CM | POA: Diagnosis not present

## 2016-11-08 DIAGNOSIS — E790 Hyperuricemia without signs of inflammatory arthritis and tophaceous disease: Secondary | ICD-10-CM | POA: Diagnosis not present

## 2016-11-08 DIAGNOSIS — R809 Proteinuria, unspecified: Secondary | ICD-10-CM | POA: Diagnosis not present

## 2016-11-08 DIAGNOSIS — I129 Hypertensive chronic kidney disease with stage 1 through stage 4 chronic kidney disease, or unspecified chronic kidney disease: Secondary | ICD-10-CM | POA: Diagnosis not present

## 2016-11-09 ENCOUNTER — Encounter: Payer: Self-pay | Admitting: Family Medicine

## 2016-11-11 ENCOUNTER — Encounter: Payer: Self-pay | Admitting: Family Medicine

## 2016-11-13 ENCOUNTER — Ambulatory Visit (INDEPENDENT_AMBULATORY_CARE_PROVIDER_SITE_OTHER): Payer: BLUE CROSS/BLUE SHIELD | Admitting: Psychology

## 2016-11-13 DIAGNOSIS — F341 Dysthymic disorder: Secondary | ICD-10-CM | POA: Diagnosis not present

## 2016-11-13 DIAGNOSIS — F064 Anxiety disorder due to known physiological condition: Secondary | ICD-10-CM

## 2016-11-13 DIAGNOSIS — N181 Chronic kidney disease, stage 1: Secondary | ICD-10-CM | POA: Diagnosis not present

## 2016-11-20 ENCOUNTER — Ambulatory Visit (INDEPENDENT_AMBULATORY_CARE_PROVIDER_SITE_OTHER): Payer: BLUE CROSS/BLUE SHIELD | Admitting: Psychology

## 2016-11-20 DIAGNOSIS — F341 Dysthymic disorder: Secondary | ICD-10-CM

## 2016-11-20 DIAGNOSIS — F064 Anxiety disorder due to known physiological condition: Secondary | ICD-10-CM

## 2016-11-27 ENCOUNTER — Ambulatory Visit (INDEPENDENT_AMBULATORY_CARE_PROVIDER_SITE_OTHER): Payer: BLUE CROSS/BLUE SHIELD | Admitting: Psychology

## 2016-11-27 DIAGNOSIS — F341 Dysthymic disorder: Secondary | ICD-10-CM | POA: Diagnosis not present

## 2016-11-27 DIAGNOSIS — F064 Anxiety disorder due to known physiological condition: Secondary | ICD-10-CM | POA: Diagnosis not present

## 2016-12-04 ENCOUNTER — Ambulatory Visit (INDEPENDENT_AMBULATORY_CARE_PROVIDER_SITE_OTHER): Payer: BLUE CROSS/BLUE SHIELD | Admitting: Psychology

## 2016-12-04 DIAGNOSIS — F341 Dysthymic disorder: Secondary | ICD-10-CM

## 2016-12-04 DIAGNOSIS — F064 Anxiety disorder due to known physiological condition: Secondary | ICD-10-CM | POA: Diagnosis not present

## 2016-12-04 DIAGNOSIS — R399 Unspecified symptoms and signs involving the genitourinary system: Secondary | ICD-10-CM | POA: Diagnosis not present

## 2016-12-11 ENCOUNTER — Ambulatory Visit (INDEPENDENT_AMBULATORY_CARE_PROVIDER_SITE_OTHER): Payer: BLUE CROSS/BLUE SHIELD | Admitting: Psychology

## 2016-12-11 DIAGNOSIS — F064 Anxiety disorder due to known physiological condition: Secondary | ICD-10-CM | POA: Diagnosis not present

## 2016-12-11 DIAGNOSIS — F341 Dysthymic disorder: Secondary | ICD-10-CM | POA: Diagnosis not present

## 2016-12-18 ENCOUNTER — Ambulatory Visit: Payer: BLUE CROSS/BLUE SHIELD | Admitting: Psychology

## 2016-12-21 DIAGNOSIS — F4010 Social phobia, unspecified: Secondary | ICD-10-CM | POA: Diagnosis not present

## 2016-12-25 ENCOUNTER — Ambulatory Visit: Payer: BLUE CROSS/BLUE SHIELD | Admitting: Psychology

## 2017-01-07 ENCOUNTER — Ambulatory Visit (INDEPENDENT_AMBULATORY_CARE_PROVIDER_SITE_OTHER): Payer: BLUE CROSS/BLUE SHIELD | Admitting: Psychology

## 2017-01-07 DIAGNOSIS — F341 Dysthymic disorder: Secondary | ICD-10-CM | POA: Diagnosis not present

## 2017-01-07 DIAGNOSIS — F064 Anxiety disorder due to known physiological condition: Secondary | ICD-10-CM

## 2017-01-22 ENCOUNTER — Ambulatory Visit (INDEPENDENT_AMBULATORY_CARE_PROVIDER_SITE_OTHER): Payer: BLUE CROSS/BLUE SHIELD | Admitting: Psychology

## 2017-01-22 DIAGNOSIS — F341 Dysthymic disorder: Secondary | ICD-10-CM

## 2017-01-22 DIAGNOSIS — F064 Anxiety disorder due to known physiological condition: Secondary | ICD-10-CM

## 2017-01-23 DIAGNOSIS — Z9889 Other specified postprocedural states: Secondary | ICD-10-CM | POA: Diagnosis not present

## 2017-01-23 DIAGNOSIS — H5213 Myopia, bilateral: Secondary | ICD-10-CM | POA: Diagnosis not present

## 2017-01-27 ENCOUNTER — Encounter: Payer: Self-pay | Admitting: Family Medicine

## 2017-02-15 DIAGNOSIS — F4010 Social phobia, unspecified: Secondary | ICD-10-CM | POA: Diagnosis not present

## 2017-02-19 ENCOUNTER — Ambulatory Visit (INDEPENDENT_AMBULATORY_CARE_PROVIDER_SITE_OTHER): Payer: BLUE CROSS/BLUE SHIELD | Admitting: Psychology

## 2017-02-19 DIAGNOSIS — F341 Dysthymic disorder: Secondary | ICD-10-CM | POA: Diagnosis not present

## 2017-02-19 DIAGNOSIS — F064 Anxiety disorder due to known physiological condition: Secondary | ICD-10-CM

## 2017-03-05 ENCOUNTER — Ambulatory Visit (INDEPENDENT_AMBULATORY_CARE_PROVIDER_SITE_OTHER): Payer: BLUE CROSS/BLUE SHIELD | Admitting: Psychology

## 2017-03-05 DIAGNOSIS — F064 Anxiety disorder due to known physiological condition: Secondary | ICD-10-CM

## 2017-03-05 DIAGNOSIS — F341 Dysthymic disorder: Secondary | ICD-10-CM

## 2017-03-12 ENCOUNTER — Ambulatory Visit (INDEPENDENT_AMBULATORY_CARE_PROVIDER_SITE_OTHER): Payer: BLUE CROSS/BLUE SHIELD | Admitting: Psychology

## 2017-03-12 DIAGNOSIS — F341 Dysthymic disorder: Secondary | ICD-10-CM

## 2017-03-12 DIAGNOSIS — F064 Anxiety disorder due to known physiological condition: Secondary | ICD-10-CM

## 2017-03-13 ENCOUNTER — Other Ambulatory Visit: Payer: Self-pay

## 2017-03-13 ENCOUNTER — Ambulatory Visit (INDEPENDENT_AMBULATORY_CARE_PROVIDER_SITE_OTHER): Payer: BLUE CROSS/BLUE SHIELD | Admitting: Family Medicine

## 2017-03-13 ENCOUNTER — Encounter: Payer: Self-pay | Admitting: Family Medicine

## 2017-03-13 VITALS — BP 124/82 | HR 87 | Temp 98.1°F | Ht 73.0 in | Wt 157.0 lb

## 2017-03-13 DIAGNOSIS — B370 Candidal stomatitis: Secondary | ICD-10-CM

## 2017-03-13 MED ORDER — NYSTATIN 100000 UNIT/ML MT SUSP: 5.0000 mL | Freq: Four times a day (QID) | OROMUCOSAL | 0 refills | Status: DC

## 2017-03-13 NOTE — Progress Notes (Signed)
Subjective:  James Matthews is a 21 y.o. year old very pleasant male patient who presents for/with See problem oriented charting ROS- no fever, chills. Mild nausea. No vomiting. Some loose stools- no constipatoin.    Past Medical History-  Patient Active Problem List   Diagnosis Date Noted  . Mesenteric lymphadenopathy 07/27/2016    Priority: High  . Major depressive disorder, recurrent episode, moderate (HCC) 06/04/2016    Priority: High  . Verbal apraxia     Priority: High  . Posterior urethral valves (PUV) 06/04/2016    Priority: Medium  . Essential hypertension 06/04/2016    Priority: Medium  . Bladder spasms 06/04/2016    Priority: Medium  . GAD (generalized anxiety disorder) 06/04/2016    Priority: Medium  . Chronic kidney disease, stage I 06/04/2016    Priority: Medium  . GERD (gastroesophageal reflux disease) 06/04/2016    Priority: Low  . Premature birth     Priority: Low  . IBS (irritable bowel syndrome)     Priority: Low    Medications- reviewed and updated Current Outpatient Prescriptions  Medication Sig Dispense Refill  . ALPRAZolam (XANAX) 0.5 MG tablet Take 1 tablet (0.5 mg total) by mouth 2 (two) times daily as needed for anxiety. (Patient taking differently: Take 0.25 mg by mouth 2 (two) times daily as needed for anxiety. ) 30 tablet 0  . docusate sodium (COLACE) 100 MG capsule Take 1-2 qhs    . fluvoxaMINE (LUVOX) 100 MG tablet Take 100 mg by mouth at bedtime.    Marland Kitchen. loperamide (IMODIUM) 2 MG capsule Take 1 capsule (2 mg total) by mouth 4 (four) times daily as needed for diarrhea or loose stools. 12 capsule 0  . losartan (COZAAR) 25 MG tablet Take 50 mg by mouth daily.     . Melatonin 5 MG TABS Take 1 tablet by mouth at bedtime.    . Multiple Vitamins-Minerals (MULTIVITAMIN ADULT PO) Take 1 tablet by mouth daily.    . Probiotic Product (PROBIOTIC PO) Take 1 capsule by mouth daily.     No current facility-administered medications for this visit.      Objective: BP 124/82 (BP Location: Left Arm, Patient Position: Sitting, Cuff Size: Normal)   Pulse 87   Temp 98.1 F (36.7 C) (Oral)   Ht 6\' 1"  (1.854 m)   Wt 157 lb (71.2 kg)   SpO2 98%   BMI 20.71 kg/m  Gen: NAD, resting comfortably, appears fatigued TM normal, pharynx largely normal, tongue with white plaque that does not easily wipe off- also present on sides of mouth at lower level. Dry mucus membranes CV: RRR no murmurs rubs or gallops Lungs: CTAB no crackles, wheeze, rhonchi Abdomen: soft/nontender/nondistended/normal bowel sounds. No rebound or guarding.  Ext: no edema Skin: warm, dry  Assessment/Plan:  Oral thrush S:  patient complains of dry mouth  And tongue is more white in appearance since Monday. Throat feels swollen but is not painful. . Due to dryness, feels like it is harder to swallow. Eyes have been slightly more dry.   Yesterday temperature at highest was 99. Mild nausea but no vomiting. No sick contacts in his family. Potential sick contacts at college.   Some diarrhe as well- 3 BMs yesterday, 2 on Monday. Normal for him is daily or every other day and not loose.  A/P: unclear trigger for thrush- no recent antibiotics, no steroids oral or inhaled. Still appears to be thrush and will trial treatment.  Patient Instructions  This appears  to be thrush- Use nystatin 4x a day for 7-10 days to treat  Dryness also promoted by loose stools- make sure to drink at least 60-70 oz of water a day  If not better by Monday- return to see [place]- if new symptoms such as fever or food getting stuck return to se eus sooner  Meds ordered this encounter  Medications  . DULoxetine (CYMBALTA) 60 MG capsule    Sig: Take 60 mg by mouth daily.  Marland Kitchen nystatin (MYCOSTATIN) 100000 UNIT/ML suspension    Sig: Take 5 mLs (500,000 Units total) by mouth 4 (four) times daily. Swish for several minutes, gargle and spit out for 7-10 days    Dispense:  60 mL    Refill:  0    Return  precautions advised.  Tana Conch, MD

## 2017-03-13 NOTE — Patient Instructions (Signed)
This appears to be thrush- Use nystatin 4x a day for 7-10 days to treat  Dryness also promoted by loose stools- make sure to drink at least 60-70 oz of water a day  If not better by Monday- return to see us- if new symptoms such as fever or food getting stuck return to se eus sooner

## 2017-03-19 ENCOUNTER — Ambulatory Visit (INDEPENDENT_AMBULATORY_CARE_PROVIDER_SITE_OTHER): Payer: BLUE CROSS/BLUE SHIELD | Admitting: Psychology

## 2017-03-19 DIAGNOSIS — F341 Dysthymic disorder: Secondary | ICD-10-CM | POA: Diagnosis not present

## 2017-03-19 DIAGNOSIS — F064 Anxiety disorder due to known physiological condition: Secondary | ICD-10-CM

## 2017-03-22 DIAGNOSIS — F4010 Social phobia, unspecified: Secondary | ICD-10-CM | POA: Diagnosis not present

## 2017-03-28 ENCOUNTER — Telehealth: Payer: Self-pay | Admitting: Family Medicine

## 2017-03-28 NOTE — Telephone Encounter (Signed)
Thanks [employer]. Thanks Dr. Clent Ridges for seeing him. Great guy- deals with anxiety fair amount.

## 2017-03-28 NOTE — Telephone Encounter (Signed)
Called patient, he reports that while riding in the car yesterday, he suddenly developed nausea, headache, fatigue, shakiness. Checked his Fitbit and HR was 120. This resolved on its own. This morning while sitting eating breakfast, he noticed his Fitbit had his HR at 110 and 1 hour ago his HR was at 120.  Patient denies chest pain, one-sided numbness/weakness, fever, bleeding, and blurred vision. He occasionally has some SOB, which is unchanged from baseline. He is eating and drinking regularly.   Patient scheduled for acute appt tomorrow at 10am w/ Dr. Clent Ridges. Advised patient of red flags that should prompt emergent assessment prior to scheduled appt such as CP, severe SOB, and one-sided numbness/weakness and patient verbalized understanding and agreed to plan.

## 2017-03-28 NOTE — Telephone Encounter (Signed)
° ° °  Pt call to say his heart rate went up to 120 yeaterday and he got really sick,nausea    [phone]     Pt ask for a call back

## 2017-03-29 ENCOUNTER — Ambulatory Visit (INDEPENDENT_AMBULATORY_CARE_PROVIDER_SITE_OTHER): Payer: BLUE CROSS/BLUE SHIELD | Admitting: Family Medicine

## 2017-03-29 ENCOUNTER — Encounter: Payer: Self-pay | Admitting: Family Medicine

## 2017-03-29 VITALS — BP 123/88 | HR 78 | Temp 98.2°F | Ht 73.0 in | Wt 158.0 lb

## 2017-03-29 DIAGNOSIS — R000 Tachycardia, unspecified: Secondary | ICD-10-CM | POA: Diagnosis not present

## 2017-03-29 LAB — CBC WITH DIFFERENTIAL/PLATELET
Basophils Absolute: 0.1 10*3/uL (ref 0.0–0.1)
Basophils Relative: 1.7 % (ref 0.0–3.0)
Eosinophils Absolute: 0.2 10*3/uL (ref 0.0–0.7)
Eosinophils Relative: 4.9 % (ref 0.0–5.0)
HCT: 42 % (ref 39.0–52.0)
Hemoglobin: 14.3 g/dL (ref 13.0–17.0)
Lymphocytes Relative: 35.4 % (ref 12.0–46.0)
Lymphs Abs: 1.6 10*3/uL (ref 0.7–4.0)
MCHC: 33.9 g/dL (ref 30.0–36.0)
MCV: 95 fl (ref 78.0–100.0)
Monocytes Absolute: 0.4 10*3/uL (ref 0.1–1.0)
Monocytes Relative: 9 % (ref 3.0–12.0)
Neutro Abs: 2.3 10*3/uL (ref 1.4–7.7)
Neutrophils Relative %: 49 % (ref 43.0–77.0)
Platelets: 249 10*3/uL (ref 150.0–400.0)
RBC: 4.43 Mil/uL (ref 4.22–5.81)
RDW: 12.5 % (ref 11.5–15.5)
WBC: 4.7 10*3/uL (ref 4.0–10.5)

## 2017-03-29 LAB — BASIC METABOLIC PANEL
BUN: 13 mg/dL (ref 6–23)
CO2: 33 mEq/L — ABNORMAL HIGH (ref 19–32)
Calcium: 10.2 mg/dL (ref 8.4–10.5)
Chloride: 102 mEq/L (ref 96–112)
Creatinine, Ser: 1.15 mg/dL (ref 0.40–1.50)
GFR: 85 mL/min (ref >60.00)
Glucose, Bld: 102 mg/dL — ABNORMAL HIGH (ref 70–99)
Potassium: 4.1 mEq/L (ref 3.5–5.1)
Sodium: 140 mEq/L (ref 135–145)

## 2017-03-29 LAB — TSH: TSH: 1.86 u[IU]/mL (ref 0.35–4.50)

## 2017-03-29 NOTE — Progress Notes (Signed)
   Subjective:    Patient ID: James Matthews, male    DOB: 06-03-1996, 21 y.o.   MRN: [id number]  HPI Here asking about periods of rapid heartbeats. He has feft fine in general, no chest pains or SOB. However on 2 or 3 occasions in the past 2 weeks he describes periods of rapid heartbeats. He monitors this by wearing a Fit Bit on his wrist. His rate has gotten as high as 120 at times. This may last 5-10 minutes and then it returns to baseline. No recent change in medications. He takes a multivitamin daily but no other supplements. He uses little caffeine and he never uses energy drinks. He denies any particular stressors in his life lately. He does mention that his mother has thyroid problems.    Review of Systems  Constitutional: Negative.   Respiratory: Negative.   Cardiovascular: Positive for palpitations. Negative for chest pain and leg swelling.  Gastrointestinal: Negative.   Endocrine: Negative.   Neurological: Negative.   Psychiatric/Behavioral: Negative.        Objective:   Physical Exam  Constitutional: He is oriented to person, place, and time. He appears well-developed and well-nourished. No distress.  Neck: Neck supple. No thyromegaly present.  Cardiovascular: Normal rate, regular rhythm, normal heart sounds and intact distal pulses.   Pulmonary/Chest: Effort normal and breath sounds normal. No respiratory distress. He has no wheezes. He has no rales.  Lymphadenopathy:    He has no cervical adenopathy.  Neurological: He is alert and oriented to person, place, and time.          Assessment & Plan:  Intermittent tachycardia. The etiology of this is not clear. We will get a BMET, CBC, and TSH today. Recheck prn.  Gershon Crane, MD

## 2017-03-29 NOTE — Patient Instructions (Signed)
WE NOW OFFER   [facility] FAST TRACK!!!  SAME DAY Appointments for ACUTE CARE  Such as: Sprains, Injuries, cuts, abrasions, rashes, muscle pain, joint pain, back pain Colds, flu, sore throats, headache, allergies, cough, fever  Ear pain, sinus and eye infections Abdominal pain, nausea, vomiting, diarrhea, upset stomach Animal/insect bites  3 Easy Ways to Schedule: Walk-In Scheduling Call in scheduling Mychart Sign-up: [email or web address]         

## 2017-04-01 ENCOUNTER — Ambulatory Visit (INDEPENDENT_AMBULATORY_CARE_PROVIDER_SITE_OTHER): Payer: BLUE CROSS/BLUE SHIELD | Admitting: Family Medicine

## 2017-04-01 ENCOUNTER — Encounter: Payer: Self-pay | Admitting: Family Medicine

## 2017-04-01 VITALS — BP 130/80 | HR 95 | Temp 98.3°F | Wt 159.9 lb

## 2017-04-01 DIAGNOSIS — M79604 Pain in right leg: Secondary | ICD-10-CM | POA: Diagnosis not present

## 2017-04-01 NOTE — Progress Notes (Signed)
Subjective:     Patient ID: James Matthews, male   DOB: [birth year], 21 y.o.   MRN: [id number]  HPI Patient seen as a work in with right leg pain for the past few days-right posterior calf. There was some question at home of some swelling. Denies any injury. Symptoms are relatively mild. Minimal pain with ambulation. No chest pains. No dyspnea. Nonsmoker. No history of DVT. No specific risk factors other than mother had DVT following surgery several years ago. No known family history of coagulopathy.  He has reported stage I chronic kidney disease. History of posterior urethral valves and apparently has only one kidney. He also has history of hypertension on losartan and followed by nephrologist. Avoids nonsteroidals.  Past Medical History:  Diagnosis Date  . Hypertension   . IBS (irritable bowel syndrome)    probiotic  . Posterior urethral valves   . Premature birth    3 months early  . Renal disorder   . Verbal apraxia    Past Surgical History:  Procedure Laterality Date  . ESOPHAGEAL MANOMETRY N/A 01/23/2016   Procedure: ESOPHAGEAL MANOMETRY (EM);  Surgeon: James Elizabeth, MD;  Location: [facility];  Service: Endoscopy;  Laterality: N/A;  . LASIK     eye surgery july 2017  . NEPHRECTOMY     rght   . TONSILLECTOMY      reports that he has never smoked. He has never used smokeless tobacco. He reports that he does not drink alcohol or use drugs. family history includes Breast cancer in his mother; Heart disease in his maternal grandfather; Hyperlipidemia in his mother; Hypertension in his mother. Allergies  Allergen Reactions  . Lisinopril Other (See Comments)  . Other Other (See Comments)    Pain med? Unsure of medication, caused n/v     Review of Systems  Constitutional: Negative for fatigue.  Eyes: Negative for visual disturbance.  Respiratory: Negative for cough, chest tightness and shortness of breath.   Cardiovascular: Negative for chest pain and palpitations.   Neurological: Negative for dizziness, syncope, weakness, light-headedness and headaches.       Objective:   Physical Exam  Constitutional: He is oriented to person, place, and time. He appears well-developed and well-nourished.  HENT:  Right Ear: External ear normal.  Left Ear: External ear normal.  Mouth/Throat: Oropharynx is clear and moist.  Eyes: Pupils are equal, round, and reactive to light.  Neck: Neck supple. No thyromegaly present.  Cardiovascular: Normal rate and regular rhythm.   Pulmonary/Chest: Effort normal and breath sounds normal. No respiratory distress. He has no wheezes. He has no rales.  Musculoskeletal: He exhibits no edema.  Cannot appreciate any right foot, ankle, or leg edema. We measured 25 cm proximal to the medial malleolus on both legs and got diameter of 32 cm exactly on the left and right leg. There are no color changes. Good distal foot pulses. Good capillary refill  Neurological: He is alert and oriented to person, place, and time.       Assessment:     Right leg pain. He has some mild tenderness in palpating the calf muscle but no evidence for any edema whatsoever. Clinically very low suspicion for DVT. ?muscle strain.    Plan:     -We recommend observation for now and follow-up immediately for any leg edema or other concerns. -Avoid nonsteroidals with history of kidney issues as above -Try some ice 15-20 minutes 2-3 times daily as needed for soreness  James Covey MD [place]  [facility] at [facility]

## 2017-04-01 NOTE — Patient Instructions (Signed)
Follow up  for any shortness of breath, chest pain, or increased swelling of the leg.

## 2017-04-02 ENCOUNTER — Ambulatory Visit (INDEPENDENT_AMBULATORY_CARE_PROVIDER_SITE_OTHER): Payer: BLUE CROSS/BLUE SHIELD | Admitting: Psychology

## 2017-04-02 DIAGNOSIS — F064 Anxiety disorder due to known physiological condition: Secondary | ICD-10-CM | POA: Diagnosis not present

## 2017-04-02 DIAGNOSIS — F341 Dysthymic disorder: Secondary | ICD-10-CM

## 2017-04-04 DIAGNOSIS — Z79899 Other long term (current) drug therapy: Secondary | ICD-10-CM | POA: Diagnosis not present

## 2017-04-04 DIAGNOSIS — N2889 Other specified disorders of kidney and ureter: Secondary | ICD-10-CM | POA: Diagnosis not present

## 2017-04-04 DIAGNOSIS — N181 Chronic kidney disease, stage 1: Secondary | ICD-10-CM | POA: Diagnosis not present

## 2017-04-04 DIAGNOSIS — R809 Proteinuria, unspecified: Secondary | ICD-10-CM | POA: Diagnosis not present

## 2017-04-04 DIAGNOSIS — K219 Gastro-esophageal reflux disease without esophagitis: Secondary | ICD-10-CM | POA: Diagnosis not present

## 2017-04-04 DIAGNOSIS — I151 Hypertension secondary to other renal disorders: Secondary | ICD-10-CM | POA: Diagnosis not present

## 2017-04-04 DIAGNOSIS — Z905 Acquired absence of kidney: Secondary | ICD-10-CM | POA: Diagnosis not present

## 2017-04-04 DIAGNOSIS — R634 Abnormal weight loss: Secondary | ICD-10-CM | POA: Diagnosis not present

## 2017-04-04 DIAGNOSIS — I129 Hypertensive chronic kidney disease with stage 1 through stage 4 chronic kidney disease, or unspecified chronic kidney disease: Secondary | ICD-10-CM | POA: Diagnosis not present

## 2017-04-04 DIAGNOSIS — E790 Hyperuricemia without signs of inflammatory arthritis and tophaceous disease: Secondary | ICD-10-CM | POA: Diagnosis not present

## 2017-04-09 ENCOUNTER — Ambulatory Visit (INDEPENDENT_AMBULATORY_CARE_PROVIDER_SITE_OTHER): Payer: BLUE CROSS/BLUE SHIELD | Admitting: Psychology

## 2017-04-09 DIAGNOSIS — F902 Attention-deficit hyperactivity disorder, combined type: Secondary | ICD-10-CM | POA: Diagnosis not present

## 2017-04-09 DIAGNOSIS — F341 Dysthymic disorder: Secondary | ICD-10-CM | POA: Diagnosis not present

## 2017-04-16 ENCOUNTER — Ambulatory Visit (INDEPENDENT_AMBULATORY_CARE_PROVIDER_SITE_OTHER): Payer: BLUE CROSS/BLUE SHIELD | Admitting: Psychology

## 2017-04-16 DIAGNOSIS — F064 Anxiety disorder due to known physiological condition: Secondary | ICD-10-CM | POA: Diagnosis not present

## 2017-04-16 DIAGNOSIS — F341 Dysthymic disorder: Secondary | ICD-10-CM

## 2017-04-23 ENCOUNTER — Ambulatory Visit (INDEPENDENT_AMBULATORY_CARE_PROVIDER_SITE_OTHER): Payer: BLUE CROSS/BLUE SHIELD | Admitting: Psychology

## 2017-04-23 DIAGNOSIS — F064 Anxiety disorder due to known physiological condition: Secondary | ICD-10-CM | POA: Diagnosis not present

## 2017-04-23 DIAGNOSIS — F341 Dysthymic disorder: Secondary | ICD-10-CM | POA: Diagnosis not present

## 2017-04-25 DIAGNOSIS — I151 Hypertension secondary to other renal disorders: Secondary | ICD-10-CM | POA: Diagnosis not present

## 2017-04-25 DIAGNOSIS — N2889 Other specified disorders of kidney and ureter: Secondary | ICD-10-CM | POA: Diagnosis not present

## 2017-04-25 DIAGNOSIS — Q600 Renal agenesis, unilateral: Secondary | ICD-10-CM | POA: Diagnosis not present

## 2017-04-29 DIAGNOSIS — Z87898 Personal history of other specified conditions: Secondary | ICD-10-CM | POA: Diagnosis not present

## 2017-04-29 DIAGNOSIS — Q600 Renal agenesis, unilateral: Secondary | ICD-10-CM | POA: Diagnosis not present

## 2017-04-30 ENCOUNTER — Ambulatory Visit (INDEPENDENT_AMBULATORY_CARE_PROVIDER_SITE_OTHER): Payer: BLUE CROSS/BLUE SHIELD | Admitting: Psychology

## 2017-04-30 DIAGNOSIS — F341 Dysthymic disorder: Secondary | ICD-10-CM | POA: Diagnosis not present

## 2017-04-30 DIAGNOSIS — F064 Anxiety disorder due to known physiological condition: Secondary | ICD-10-CM | POA: Diagnosis not present

## 2017-05-01 ENCOUNTER — Telehealth: Payer: Self-pay | Admitting: Family Medicine

## 2017-05-01 DIAGNOSIS — R590 Localized enlarged lymph nodes: Secondary | ICD-10-CM

## 2017-05-01 DIAGNOSIS — K5900 Constipation, unspecified: Secondary | ICD-10-CM

## 2017-05-01 NOTE — Telephone Encounter (Signed)
Patient returning phone call. The telephone note did not have a note in it yet from epic. Please call patient to advise, Okay to leave a detailed message.

## 2017-05-01 NOTE — Telephone Encounter (Signed)
Patient has been notified of the note below. No further action required.

## 2017-05-01 NOTE — Telephone Encounter (Signed)
Time for 6 month follow up CT- I have ordered this and patient should get a call about scheduling within a week

## 2017-05-07 ENCOUNTER — Ambulatory Visit (INDEPENDENT_AMBULATORY_CARE_PROVIDER_SITE_OTHER): Payer: BLUE CROSS/BLUE SHIELD | Admitting: Psychology

## 2017-05-07 DIAGNOSIS — F064 Anxiety disorder due to known physiological condition: Secondary | ICD-10-CM | POA: Diagnosis not present

## 2017-05-07 DIAGNOSIS — F341 Dysthymic disorder: Secondary | ICD-10-CM

## 2017-05-13 DIAGNOSIS — F4010 Social phobia, unspecified: Secondary | ICD-10-CM | POA: Diagnosis not present

## 2017-05-14 ENCOUNTER — Ambulatory Visit: Payer: BLUE CROSS/BLUE SHIELD | Admitting: Psychology

## 2017-05-14 DIAGNOSIS — F341 Dysthymic disorder: Secondary | ICD-10-CM | POA: Diagnosis not present

## 2017-05-14 DIAGNOSIS — F064 Anxiety disorder due to known physiological condition: Secondary | ICD-10-CM | POA: Diagnosis not present

## 2017-05-20 ENCOUNTER — Ambulatory Visit (INDEPENDENT_AMBULATORY_CARE_PROVIDER_SITE_OTHER)
Admission: RE | Admit: 2017-05-20 | Discharge: 2017-05-20 | Disposition: A | Discharge status: Home / Self Care | Payer: BLUE CROSS/BLUE SHIELD | Source: Ambulatory Visit | Attending: Family Medicine | Admitting: Family Medicine

## 2017-05-20 DIAGNOSIS — R590 Localized enlarged lymph nodes: Secondary | ICD-10-CM | POA: Diagnosis not present

## 2017-05-20 DIAGNOSIS — N181 Chronic kidney disease, stage 1: Secondary | ICD-10-CM | POA: Diagnosis not present

## 2017-05-20 DIAGNOSIS — K5900 Constipation, unspecified: Secondary | ICD-10-CM

## 2017-05-21 ENCOUNTER — Ambulatory Visit: Payer: BLUE CROSS/BLUE SHIELD | Admitting: Psychology

## 2017-05-21 DIAGNOSIS — F064 Anxiety disorder due to known physiological condition: Secondary | ICD-10-CM

## 2017-05-21 DIAGNOSIS — F341 Dysthymic disorder: Secondary | ICD-10-CM

## 2017-05-28 ENCOUNTER — Ambulatory Visit: Payer: BLUE CROSS/BLUE SHIELD | Admitting: Psychology

## 2017-05-28 DIAGNOSIS — F341 Dysthymic disorder: Secondary | ICD-10-CM

## 2017-05-28 DIAGNOSIS — F064 Anxiety disorder due to known physiological condition: Secondary | ICD-10-CM

## 2017-06-04 ENCOUNTER — Ambulatory Visit: Payer: BLUE CROSS/BLUE SHIELD | Admitting: Psychology

## 2017-06-04 DIAGNOSIS — F064 Anxiety disorder due to known physiological condition: Secondary | ICD-10-CM

## 2017-06-04 DIAGNOSIS — F341 Dysthymic disorder: Secondary | ICD-10-CM | POA: Diagnosis not present

## 2017-06-06 DIAGNOSIS — Q642 Congenital posterior urethral valves: Secondary | ICD-10-CM | POA: Diagnosis not present

## 2017-06-06 DIAGNOSIS — N1330 Unspecified hydronephrosis: Secondary | ICD-10-CM | POA: Diagnosis not present

## 2017-06-06 DIAGNOSIS — Q600 Renal agenesis, unilateral: Secondary | ICD-10-CM | POA: Diagnosis not present

## 2017-06-10 DIAGNOSIS — F4010 Social phobia, unspecified: Secondary | ICD-10-CM | POA: Diagnosis not present

## 2017-06-11 ENCOUNTER — Ambulatory Visit: Payer: BLUE CROSS/BLUE SHIELD | Admitting: Psychology

## 2017-06-11 DIAGNOSIS — F341 Dysthymic disorder: Secondary | ICD-10-CM | POA: Diagnosis not present

## 2017-06-11 DIAGNOSIS — F064 Anxiety disorder due to known physiological condition: Secondary | ICD-10-CM

## 2017-06-18 ENCOUNTER — Ambulatory Visit: Payer: BLUE CROSS/BLUE SHIELD | Admitting: Psychology

## 2017-06-25 ENCOUNTER — Ambulatory Visit: Payer: BLUE CROSS/BLUE SHIELD | Admitting: Psychology

## 2017-06-25 DIAGNOSIS — F064 Anxiety disorder due to known physiological condition: Secondary | ICD-10-CM

## 2017-06-25 DIAGNOSIS — F341 Dysthymic disorder: Secondary | ICD-10-CM | POA: Diagnosis not present

## 2017-07-11 DIAGNOSIS — Z905 Acquired absence of kidney: Secondary | ICD-10-CM | POA: Diagnosis not present

## 2017-07-11 DIAGNOSIS — E790 Hyperuricemia without signs of inflammatory arthritis and tophaceous disease: Secondary | ICD-10-CM | POA: Diagnosis not present

## 2017-07-11 DIAGNOSIS — R809 Proteinuria, unspecified: Secondary | ICD-10-CM | POA: Diagnosis not present

## 2017-07-11 DIAGNOSIS — N181 Chronic kidney disease, stage 1: Secondary | ICD-10-CM | POA: Diagnosis not present

## 2017-07-11 DIAGNOSIS — I129 Hypertensive chronic kidney disease with stage 1 through stage 4 chronic kidney disease, or unspecified chronic kidney disease: Secondary | ICD-10-CM | POA: Diagnosis not present

## 2017-07-11 DIAGNOSIS — K219 Gastro-esophageal reflux disease without esophagitis: Secondary | ICD-10-CM | POA: Diagnosis not present

## 2017-07-16 ENCOUNTER — Ambulatory Visit: Payer: BLUE CROSS/BLUE SHIELD | Admitting: Psychology

## 2017-07-16 DIAGNOSIS — F064 Anxiety disorder due to known physiological condition: Secondary | ICD-10-CM

## 2017-07-16 DIAGNOSIS — F341 Dysthymic disorder: Secondary | ICD-10-CM | POA: Diagnosis not present

## 2017-07-18 DIAGNOSIS — F4010 Social phobia, unspecified: Secondary | ICD-10-CM | POA: Diagnosis not present

## 2017-07-23 ENCOUNTER — Ambulatory Visit: Payer: BLUE CROSS/BLUE SHIELD | Admitting: Psychology

## 2017-07-23 DIAGNOSIS — F064 Anxiety disorder due to known physiological condition: Secondary | ICD-10-CM | POA: Diagnosis not present

## 2017-07-23 DIAGNOSIS — F341 Dysthymic disorder: Secondary | ICD-10-CM | POA: Diagnosis not present

## 2017-07-30 ENCOUNTER — Ambulatory Visit: Payer: BLUE CROSS/BLUE SHIELD | Admitting: Psychology

## 2017-07-30 DIAGNOSIS — F341 Dysthymic disorder: Secondary | ICD-10-CM

## 2017-07-30 DIAGNOSIS — F064 Anxiety disorder due to known physiological condition: Secondary | ICD-10-CM | POA: Diagnosis not present

## 2017-08-06 ENCOUNTER — Ambulatory Visit: Payer: Self-pay | Admitting: Psychology

## 2017-08-06 ENCOUNTER — Ambulatory Visit (INDEPENDENT_AMBULATORY_CARE_PROVIDER_SITE_OTHER): Payer: BLUE CROSS/BLUE SHIELD | Admitting: Psychology

## 2017-08-06 DIAGNOSIS — F330 Major depressive disorder, recurrent, mild: Secondary | ICD-10-CM | POA: Diagnosis not present

## 2017-08-06 DIAGNOSIS — F418 Other specified anxiety disorders: Secondary | ICD-10-CM | POA: Diagnosis not present

## 2017-08-06 DIAGNOSIS — F341 Dysthymic disorder: Secondary | ICD-10-CM | POA: Diagnosis not present

## 2017-08-13 ENCOUNTER — Ambulatory Visit: Payer: BLUE CROSS/BLUE SHIELD | Admitting: Psychology

## 2017-08-13 DIAGNOSIS — F341 Dysthymic disorder: Secondary | ICD-10-CM | POA: Diagnosis not present

## 2017-08-13 DIAGNOSIS — F064 Anxiety disorder due to known physiological condition: Secondary | ICD-10-CM

## 2017-08-20 ENCOUNTER — Ambulatory Visit: Payer: Self-pay | Admitting: Psychology

## 2017-08-27 ENCOUNTER — Ambulatory Visit: Payer: BLUE CROSS/BLUE SHIELD | Admitting: Psychology

## 2017-08-27 DIAGNOSIS — F341 Dysthymic disorder: Secondary | ICD-10-CM

## 2017-08-27 DIAGNOSIS — F064 Anxiety disorder due to known physiological condition: Secondary | ICD-10-CM

## 2017-09-03 ENCOUNTER — Ambulatory Visit: Payer: Self-pay | Admitting: Psychology

## 2017-09-03 ENCOUNTER — Ambulatory Visit: Payer: BLUE CROSS/BLUE SHIELD | Admitting: Psychology

## 2017-09-03 DIAGNOSIS — F064 Anxiety disorder due to known physiological condition: Secondary | ICD-10-CM

## 2017-09-03 DIAGNOSIS — F341 Dysthymic disorder: Secondary | ICD-10-CM | POA: Diagnosis not present

## 2017-09-10 ENCOUNTER — Ambulatory Visit: Payer: BLUE CROSS/BLUE SHIELD | Admitting: Psychology

## 2017-09-10 DIAGNOSIS — F064 Anxiety disorder due to known physiological condition: Secondary | ICD-10-CM

## 2017-09-10 DIAGNOSIS — F341 Dysthymic disorder: Secondary | ICD-10-CM | POA: Diagnosis not present

## 2017-09-12 DIAGNOSIS — F4010 Social phobia, unspecified: Secondary | ICD-10-CM | POA: Diagnosis not present

## 2017-09-17 ENCOUNTER — Ambulatory Visit: Payer: Self-pay | Admitting: Psychology

## 2017-09-17 ENCOUNTER — Ambulatory Visit: Payer: BLUE CROSS/BLUE SHIELD | Admitting: Psychology

## 2017-09-17 DIAGNOSIS — F341 Dysthymic disorder: Secondary | ICD-10-CM | POA: Diagnosis not present

## 2017-09-17 DIAGNOSIS — F064 Anxiety disorder due to known physiological condition: Secondary | ICD-10-CM | POA: Diagnosis not present

## 2017-09-24 ENCOUNTER — Ambulatory Visit: Payer: BLUE CROSS/BLUE SHIELD | Admitting: Psychology

## 2017-09-24 DIAGNOSIS — F341 Dysthymic disorder: Secondary | ICD-10-CM | POA: Diagnosis not present

## 2017-09-24 DIAGNOSIS — F064 Anxiety disorder due to known physiological condition: Secondary | ICD-10-CM

## 2017-10-01 ENCOUNTER — Ambulatory Visit: Payer: Self-pay | Admitting: Psychology

## 2017-10-01 ENCOUNTER — Ambulatory Visit: Payer: BLUE CROSS/BLUE SHIELD | Admitting: Psychology

## 2017-10-01 DIAGNOSIS — F341 Dysthymic disorder: Secondary | ICD-10-CM | POA: Diagnosis not present

## 2017-10-01 DIAGNOSIS — F064 Anxiety disorder due to known physiological condition: Secondary | ICD-10-CM

## 2017-10-08 ENCOUNTER — Ambulatory Visit: Payer: BLUE CROSS/BLUE SHIELD | Admitting: Psychology

## 2017-10-08 DIAGNOSIS — F064 Anxiety disorder due to known physiological condition: Secondary | ICD-10-CM

## 2017-10-08 DIAGNOSIS — F341 Dysthymic disorder: Secondary | ICD-10-CM

## 2017-10-15 ENCOUNTER — Ambulatory Visit: Payer: BLUE CROSS/BLUE SHIELD | Admitting: Psychology

## 2017-10-15 ENCOUNTER — Ambulatory Visit: Payer: Self-pay | Admitting: Psychology

## 2017-10-15 DIAGNOSIS — F341 Dysthymic disorder: Secondary | ICD-10-CM

## 2017-10-15 DIAGNOSIS — F064 Anxiety disorder due to known physiological condition: Secondary | ICD-10-CM | POA: Diagnosis not present

## 2017-10-22 ENCOUNTER — Ambulatory Visit: Payer: BLUE CROSS/BLUE SHIELD | Admitting: Psychology

## 2017-10-22 DIAGNOSIS — F341 Dysthymic disorder: Secondary | ICD-10-CM

## 2017-10-22 DIAGNOSIS — F064 Anxiety disorder due to known physiological condition: Secondary | ICD-10-CM

## 2017-10-29 ENCOUNTER — Ambulatory Visit: Payer: BLUE CROSS/BLUE SHIELD | Admitting: Psychology

## 2017-10-29 ENCOUNTER — Ambulatory Visit: Payer: Self-pay | Admitting: Psychology

## 2017-10-29 DIAGNOSIS — F341 Dysthymic disorder: Secondary | ICD-10-CM | POA: Diagnosis not present

## 2017-10-29 DIAGNOSIS — F064 Anxiety disorder due to known physiological condition: Secondary | ICD-10-CM

## 2017-11-05 ENCOUNTER — Ambulatory Visit: Payer: BLUE CROSS/BLUE SHIELD | Admitting: Psychology

## 2017-11-05 DIAGNOSIS — F341 Dysthymic disorder: Secondary | ICD-10-CM | POA: Diagnosis not present

## 2017-11-05 DIAGNOSIS — F064 Anxiety disorder due to known physiological condition: Secondary | ICD-10-CM | POA: Diagnosis not present

## 2017-11-12 ENCOUNTER — Ambulatory Visit: Payer: Self-pay | Admitting: Psychology

## 2017-11-12 ENCOUNTER — Ambulatory Visit: Payer: BLUE CROSS/BLUE SHIELD | Admitting: Psychology

## 2017-11-12 DIAGNOSIS — F341 Dysthymic disorder: Secondary | ICD-10-CM | POA: Diagnosis not present

## 2017-11-12 DIAGNOSIS — F064 Anxiety disorder due to known physiological condition: Secondary | ICD-10-CM

## 2017-11-19 ENCOUNTER — Ambulatory Visit: Payer: BLUE CROSS/BLUE SHIELD | Admitting: Psychology

## 2017-11-19 DIAGNOSIS — F064 Anxiety disorder due to known physiological condition: Secondary | ICD-10-CM

## 2017-11-19 DIAGNOSIS — F341 Dysthymic disorder: Secondary | ICD-10-CM

## 2017-11-26 ENCOUNTER — Ambulatory Visit: Payer: BLUE CROSS/BLUE SHIELD | Admitting: Psychology

## 2017-11-26 ENCOUNTER — Ambulatory Visit: Payer: Self-pay | Admitting: Psychology

## 2017-11-26 DIAGNOSIS — F341 Dysthymic disorder: Secondary | ICD-10-CM | POA: Diagnosis not present

## 2017-11-26 DIAGNOSIS — F064 Anxiety disorder due to known physiological condition: Secondary | ICD-10-CM | POA: Diagnosis not present

## 2017-12-03 ENCOUNTER — Ambulatory Visit: Payer: BLUE CROSS/BLUE SHIELD | Admitting: Psychology

## 2017-12-03 DIAGNOSIS — F341 Dysthymic disorder: Secondary | ICD-10-CM | POA: Diagnosis not present

## 2017-12-03 DIAGNOSIS — F064 Anxiety disorder due to known physiological condition: Secondary | ICD-10-CM

## 2017-12-10 ENCOUNTER — Ambulatory Visit: Payer: BLUE CROSS/BLUE SHIELD | Admitting: Psychology

## 2017-12-10 DIAGNOSIS — F064 Anxiety disorder due to known physiological condition: Secondary | ICD-10-CM

## 2017-12-10 DIAGNOSIS — F341 Dysthymic disorder: Secondary | ICD-10-CM | POA: Diagnosis not present

## 2017-12-17 ENCOUNTER — Ambulatory Visit: Payer: BLUE CROSS/BLUE SHIELD | Admitting: Psychology

## 2017-12-17 DIAGNOSIS — F341 Dysthymic disorder: Secondary | ICD-10-CM

## 2017-12-17 DIAGNOSIS — F064 Anxiety disorder due to known physiological condition: Secondary | ICD-10-CM

## 2017-12-24 ENCOUNTER — Ambulatory Visit: Payer: BLUE CROSS/BLUE SHIELD | Admitting: Psychology

## 2017-12-24 DIAGNOSIS — F064 Anxiety disorder due to known physiological condition: Secondary | ICD-10-CM

## 2017-12-24 DIAGNOSIS — F341 Dysthymic disorder: Secondary | ICD-10-CM | POA: Diagnosis not present

## 2017-12-31 ENCOUNTER — Ambulatory Visit: Payer: BLUE CROSS/BLUE SHIELD | Admitting: Psychology

## 2017-12-31 DIAGNOSIS — F064 Anxiety disorder due to known physiological condition: Secondary | ICD-10-CM | POA: Diagnosis not present

## 2017-12-31 DIAGNOSIS — F341 Dysthymic disorder: Secondary | ICD-10-CM

## 2018-01-02 DIAGNOSIS — F4010 Social phobia, unspecified: Secondary | ICD-10-CM | POA: Diagnosis not present

## 2018-01-07 ENCOUNTER — Ambulatory Visit: Payer: BLUE CROSS/BLUE SHIELD | Admitting: Psychology

## 2018-01-07 DIAGNOSIS — F064 Anxiety disorder due to known physiological condition: Secondary | ICD-10-CM

## 2018-01-07 DIAGNOSIS — F341 Dysthymic disorder: Secondary | ICD-10-CM | POA: Diagnosis not present

## 2018-01-10 DIAGNOSIS — Q600 Renal agenesis, unilateral: Secondary | ICD-10-CM | POA: Diagnosis not present

## 2018-01-10 DIAGNOSIS — N181 Chronic kidney disease, stage 1: Secondary | ICD-10-CM | POA: Diagnosis not present

## 2018-01-10 DIAGNOSIS — K219 Gastro-esophageal reflux disease without esophagitis: Secondary | ICD-10-CM | POA: Diagnosis not present

## 2018-01-10 DIAGNOSIS — R809 Proteinuria, unspecified: Secondary | ICD-10-CM | POA: Diagnosis not present

## 2018-01-10 DIAGNOSIS — E790 Hyperuricemia without signs of inflammatory arthritis and tophaceous disease: Secondary | ICD-10-CM | POA: Diagnosis not present

## 2018-01-10 DIAGNOSIS — I129 Hypertensive chronic kidney disease with stage 1 through stage 4 chronic kidney disease, or unspecified chronic kidney disease: Secondary | ICD-10-CM | POA: Diagnosis not present

## 2018-01-14 ENCOUNTER — Ambulatory Visit: Payer: BLUE CROSS/BLUE SHIELD | Admitting: Psychology

## 2018-01-14 DIAGNOSIS — F064 Anxiety disorder due to known physiological condition: Secondary | ICD-10-CM

## 2018-01-14 DIAGNOSIS — F341 Dysthymic disorder: Secondary | ICD-10-CM

## 2018-01-21 ENCOUNTER — Ambulatory Visit: Payer: BLUE CROSS/BLUE SHIELD | Admitting: Psychology

## 2018-01-21 DIAGNOSIS — F064 Anxiety disorder due to known physiological condition: Secondary | ICD-10-CM

## 2018-01-21 DIAGNOSIS — F341 Dysthymic disorder: Secondary | ICD-10-CM

## 2018-01-28 ENCOUNTER — Ambulatory Visit: Payer: BLUE CROSS/BLUE SHIELD | Admitting: Psychology

## 2018-01-28 DIAGNOSIS — F064 Anxiety disorder due to known physiological condition: Secondary | ICD-10-CM | POA: Diagnosis not present

## 2018-01-28 DIAGNOSIS — F341 Dysthymic disorder: Secondary | ICD-10-CM

## 2018-02-11 ENCOUNTER — Ambulatory Visit: Payer: BLUE CROSS/BLUE SHIELD | Admitting: Psychology

## 2018-02-11 DIAGNOSIS — F341 Dysthymic disorder: Secondary | ICD-10-CM

## 2018-02-11 DIAGNOSIS — F064 Anxiety disorder due to known physiological condition: Secondary | ICD-10-CM

## 2018-02-13 DIAGNOSIS — F4010 Social phobia, unspecified: Secondary | ICD-10-CM | POA: Diagnosis not present

## 2018-02-18 ENCOUNTER — Ambulatory Visit: Payer: BLUE CROSS/BLUE SHIELD | Admitting: Psychology

## 2018-02-18 DIAGNOSIS — F341 Dysthymic disorder: Secondary | ICD-10-CM | POA: Diagnosis not present

## 2018-02-18 DIAGNOSIS — F064 Anxiety disorder due to known physiological condition: Secondary | ICD-10-CM | POA: Diagnosis not present

## 2018-03-04 ENCOUNTER — Ambulatory Visit: Payer: BLUE CROSS/BLUE SHIELD | Admitting: Psychology

## 2018-03-04 DIAGNOSIS — F064 Anxiety disorder due to known physiological condition: Secondary | ICD-10-CM | POA: Diagnosis not present

## 2018-03-04 DIAGNOSIS — F341 Dysthymic disorder: Secondary | ICD-10-CM | POA: Diagnosis not present

## 2018-03-18 ENCOUNTER — Ambulatory Visit: Payer: BLUE CROSS/BLUE SHIELD | Admitting: Psychology

## 2018-03-20 ENCOUNTER — Ambulatory Visit: Payer: BLUE CROSS/BLUE SHIELD | Admitting: Psychology

## 2018-03-20 DIAGNOSIS — F341 Dysthymic disorder: Secondary | ICD-10-CM | POA: Diagnosis not present

## 2018-03-20 DIAGNOSIS — F064 Anxiety disorder due to known physiological condition: Secondary | ICD-10-CM

## 2018-03-25 ENCOUNTER — Ambulatory Visit: Payer: BLUE CROSS/BLUE SHIELD | Admitting: Psychology

## 2018-03-25 DIAGNOSIS — F341 Dysthymic disorder: Secondary | ICD-10-CM

## 2018-03-25 DIAGNOSIS — F064 Anxiety disorder due to known physiological condition: Secondary | ICD-10-CM | POA: Diagnosis not present

## 2018-04-01 ENCOUNTER — Ambulatory Visit: Payer: BLUE CROSS/BLUE SHIELD | Admitting: Psychology

## 2018-04-01 DIAGNOSIS — F064 Anxiety disorder due to known physiological condition: Secondary | ICD-10-CM

## 2018-04-01 DIAGNOSIS — F341 Dysthymic disorder: Secondary | ICD-10-CM

## 2018-04-08 ENCOUNTER — Ambulatory Visit: Payer: BLUE CROSS/BLUE SHIELD | Admitting: Psychology

## 2018-04-08 DIAGNOSIS — F341 Dysthymic disorder: Secondary | ICD-10-CM | POA: Diagnosis not present

## 2018-04-08 DIAGNOSIS — F064 Anxiety disorder due to known physiological condition: Secondary | ICD-10-CM

## 2018-04-15 ENCOUNTER — Ambulatory Visit (INDEPENDENT_AMBULATORY_CARE_PROVIDER_SITE_OTHER): Payer: BLUE CROSS/BLUE SHIELD | Admitting: Psychology

## 2018-04-15 DIAGNOSIS — F064 Anxiety disorder due to known physiological condition: Secondary | ICD-10-CM

## 2018-04-15 DIAGNOSIS — F341 Dysthymic disorder: Secondary | ICD-10-CM | POA: Diagnosis not present

## 2018-04-22 ENCOUNTER — Ambulatory Visit (INDEPENDENT_AMBULATORY_CARE_PROVIDER_SITE_OTHER): Payer: BLUE CROSS/BLUE SHIELD | Admitting: Psychology

## 2018-04-22 DIAGNOSIS — F064 Anxiety disorder due to known physiological condition: Secondary | ICD-10-CM

## 2018-04-22 DIAGNOSIS — F341 Dysthymic disorder: Secondary | ICD-10-CM | POA: Diagnosis not present

## 2018-04-29 ENCOUNTER — Ambulatory Visit (INDEPENDENT_AMBULATORY_CARE_PROVIDER_SITE_OTHER): Payer: BLUE CROSS/BLUE SHIELD | Admitting: Psychology

## 2018-04-29 DIAGNOSIS — F064 Anxiety disorder due to known physiological condition: Secondary | ICD-10-CM

## 2018-04-29 DIAGNOSIS — F341 Dysthymic disorder: Secondary | ICD-10-CM | POA: Diagnosis not present

## 2018-05-04 ENCOUNTER — Encounter: Payer: Self-pay | Admitting: Emergency Medicine

## 2018-05-04 DIAGNOSIS — G4700 Insomnia, unspecified: Secondary | ICD-10-CM | POA: Insufficient documentation

## 2018-05-04 DIAGNOSIS — F4010 Social phobia, unspecified: Secondary | ICD-10-CM

## 2018-05-04 DIAGNOSIS — F329 Major depressive disorder, single episode, unspecified: Secondary | ICD-10-CM | POA: Insufficient documentation

## 2018-05-06 ENCOUNTER — Ambulatory Visit (INDEPENDENT_AMBULATORY_CARE_PROVIDER_SITE_OTHER): Payer: BLUE CROSS/BLUE SHIELD | Admitting: Psychology

## 2018-05-06 DIAGNOSIS — F064 Anxiety disorder due to known physiological condition: Secondary | ICD-10-CM

## 2018-05-06 DIAGNOSIS — F341 Dysthymic disorder: Secondary | ICD-10-CM | POA: Diagnosis not present

## 2018-05-12 ENCOUNTER — Encounter: Payer: Self-pay | Admitting: Physician Assistant

## 2018-05-12 ENCOUNTER — Ambulatory Visit (INDEPENDENT_AMBULATORY_CARE_PROVIDER_SITE_OTHER): Payer: BLUE CROSS/BLUE SHIELD | Admitting: Physician Assistant

## 2018-05-12 DIAGNOSIS — F331 Major depressive disorder, recurrent, moderate: Secondary | ICD-10-CM | POA: Diagnosis not present

## 2018-05-12 DIAGNOSIS — G4700 Insomnia, unspecified: Secondary | ICD-10-CM | POA: Diagnosis not present

## 2018-05-12 DIAGNOSIS — F4010 Social phobia, unspecified: Secondary | ICD-10-CM | POA: Diagnosis not present

## 2018-05-12 NOTE — Progress Notes (Signed)
[facility]  Patient ID: James Matthews,  MRN: [id number]  PCP: Shelva Majestic, MD  Date of Evaluation: 05/12/2018 Time spent:25 minutes  Chief Complaint:  Chief Complaint    Follow-up      HISTORY/CURRENT STATUS: HPI For 3 month med check  Over past few months, has been less motivated, has felt more depressed.  Energy is low.  Classes are suffering in college.  Anxiety is an issue too.  He's trying to use techniques he's learned in CBT to help.    He's felt an electric shock feeling which he describes as a "brain zaps that he is read about" several times and it's associated with ringing in his ears.  It might happen a few times a month. Stress can worsen it too.  Time and the xanax can make it go away.  Or if he lays down to go to sleep.   Sleep had not been as much of an issue but about a month ago, he started having more trouble. Wakes up in the middle of the night.  Will look at his phone. Which he knows he shouldn't and then cannot go back to sleep.   Has had sx like this before, it might last a few weeks, and the depression symptoms will go away.  He is not suicidal.  He just feels kind of blue.  Past medications for mental health diagnoses include: Luvox, Xanax, Lexapro, Prozac, Klonopin, Cymbalta, Abilify, Sonata, Viibryd, Ativan, Ambien, Lunesta, trazodone  Individual Medical History/ Review of Systems: Changes? :No   Allergies: Lisinopril and Other  Current Medications:  Current Outpatient Medications:  .  ALPRAZolam (XANAX) 1 MG tablet, Take 1 mg by mouth 2 (two) times daily as needed for anxiety. 1/2 - 1 bid prn, Disp: , Rfl:  .  losartan (COZAAR) 100 MG tablet, Take 1 tablet by mouth every evening. , Disp: , Rfl:  .  Multiple Vitamins-Minerals (MULTIVITAMIN ADULT PO), Take 1 tablet by mouth daily., Disp: , Rfl:  .  Probiotic Product (PROBIOTIC PO), Take 1 capsule by mouth daily., Disp: , Rfl:  .  Vilazodone HCl (VIIBRYD) 40 MG TABS, Take 40  mg by mouth daily., Disp: , Rfl:  .  albuterol (PROAIR HFA) 108 (90 Base) MCG/ACT inhaler, Inhale 1-2 puffs into the lungs every 6 (six) hours as needed., Disp: , Rfl:  .  ALPRAZolam (XANAX) 0.5 MG tablet, Take 1 tablet (0.5 mg total) by mouth 2 (two) times daily as needed for anxiety. (Patient not taking: Reported on 05/12/2018), Disp: 30 tablet, Rfl: 0 .  ondansetron (ZOFRAN) 4 MG tablet, Take 4 mg by mouth every 8 (eight) hours as needed for nausea or vomiting., Disp: , Rfl:  .  ranitidine (ZANTAC) 150 MG tablet, Take 150 mg by mouth 2 (two) times daily., Disp: , Rfl:  Medication Side Effects: none  Family Medical/ Social History: Changes? No  MENTAL HEALTH EXAM:  There were no vitals taken for this visit.There is no height or weight on file to calculate BMI.  General Appearance: Well Groomed  Eye Contact:  Good  Speech:  apraxia  Volume:  Normal  Mood:  Euthymic  Affect:  Appropriate  Thought Process:  Goal Directed  Orientation:  Full (Time, Place, and Person)  Thought Content: Logical   Suicidal Thoughts:  No  Homicidal Thoughts:  No  Memory:  WNL  Judgement:  Good  Insight:  Good  Psychomotor Activity:  Normal  Concentration:  Concentration: Good  Recall:  Good  Fund of Knowledge: Good  Language: Good  Assets:  Desire for Improvement  ADL's:  Intact  Cognition: WNL  Prognosis:  Good    DIAGNOSES:    ICD-10-CM   1. Major depressive disorder, recurrent episode, moderate (HCC) F33.1   2. Social anxiety disorder F40.10   3. Insomnia, unspecified type G47.00     Receiving Psychotherapy: Dr. Jason Fila   RECOMMENDATIONS: I spent 25 minutes with the patient and at least 50% of that time was in counseling in reference to diagnosis and treatment options. He and I decided to continue the same medications at this point in time.  Since he has had episodes of similar depressive times that usually improves within a month or so, he prefers to wait and see how he will feel within the  next few weeks.  He has done well on fibrin and does not want to change unless he just has to. We did discuss the addition of Wellbutrin if needed, however this might worsen the anxiety so it have to be careful with that. Sleep hygiene was discussed. Continue psychotherapy with Dr. Jason Fila. Return in 6 weeks or sooner as needed.    Melony Overly, PA-C

## 2018-05-13 ENCOUNTER — Ambulatory Visit (INDEPENDENT_AMBULATORY_CARE_PROVIDER_SITE_OTHER): Payer: BLUE CROSS/BLUE SHIELD | Admitting: Psychology

## 2018-05-13 DIAGNOSIS — F064 Anxiety disorder due to known physiological condition: Secondary | ICD-10-CM

## 2018-05-13 DIAGNOSIS — F341 Dysthymic disorder: Secondary | ICD-10-CM | POA: Diagnosis not present

## 2018-05-20 ENCOUNTER — Ambulatory Visit (INDEPENDENT_AMBULATORY_CARE_PROVIDER_SITE_OTHER): Payer: BLUE CROSS/BLUE SHIELD | Admitting: Psychology

## 2018-05-20 DIAGNOSIS — F064 Anxiety disorder due to known physiological condition: Secondary | ICD-10-CM | POA: Diagnosis not present

## 2018-05-20 DIAGNOSIS — F341 Dysthymic disorder: Secondary | ICD-10-CM

## 2018-05-22 ENCOUNTER — Encounter: Payer: Self-pay | Admitting: Family Medicine

## 2018-05-22 ENCOUNTER — Ambulatory Visit (INDEPENDENT_AMBULATORY_CARE_PROVIDER_SITE_OTHER): Payer: BLUE CROSS/BLUE SHIELD | Admitting: Family Medicine

## 2018-05-22 VITALS — BP 118/66 | HR 81 | Temp 98.7°F | Ht 73.0 in | Wt 163.4 lb

## 2018-05-22 DIAGNOSIS — R590 Localized enlarged lymph nodes: Secondary | ICD-10-CM

## 2018-05-22 DIAGNOSIS — Z79899 Other long term (current) drug therapy: Secondary | ICD-10-CM

## 2018-05-22 DIAGNOSIS — Z23 Encounter for immunization: Secondary | ICD-10-CM | POA: Diagnosis not present

## 2018-05-22 DIAGNOSIS — Z114 Encounter for screening for human immunodeficiency virus [HIV]: Secondary | ICD-10-CM

## 2018-05-22 DIAGNOSIS — F331 Major depressive disorder, recurrent, moderate: Secondary | ICD-10-CM | POA: Diagnosis not present

## 2018-05-22 DIAGNOSIS — N182 Chronic kidney disease, stage 2 (mild): Secondary | ICD-10-CM | POA: Diagnosis not present

## 2018-05-22 DIAGNOSIS — I10 Essential (primary) hypertension: Secondary | ICD-10-CM

## 2018-05-22 DIAGNOSIS — E559 Vitamin D deficiency, unspecified: Secondary | ICD-10-CM | POA: Diagnosis not present

## 2018-05-22 DIAGNOSIS — Z0000 Encounter for general adult medical examination without abnormal findings: Secondary | ICD-10-CM

## 2018-05-22 DIAGNOSIS — Z8349 Family history of other endocrine, nutritional and metabolic diseases: Secondary | ICD-10-CM

## 2018-05-22 NOTE — Assessment & Plan Note (Signed)
S: States CKD I has progresse dto stage II and has follow up January 9th. Also has visit with new [facility]rologist at [facility]- December 23rd- graduated/transferred to adult care from Dr. Zenaida DeedAtala A/P: suspect stable- update cmet

## 2018-05-22 NOTE — Assessment & Plan Note (Signed)
S: James Matthews is managing depression- he is on viibryd 40mg . Also seeing Dr. Jason FilaBray once a week. Also on xanax 1 mg up to twice a day  thourhg James Matthews.  A/p: stable- had flare recently but wants to stay on current dose for now as has mild fluctuations from time to time. No SI.

## 2018-05-22 NOTE — Progress Notes (Signed)
Phone: [phone]  Subjective:  Patient presents today for their annual physical. Chief complaint-noted.   See problem oriented charting- ROS- full  review of systems was completed and negative including No chest pain or shortness of breath. No headache or blurry vision.  Does have some depressed mood- working with psychiatry. No Si.   The following were reviewed and entered/updated in epic: Past Medical History:  Diagnosis Date  . Hypertension   . IBS (irritable bowel syndrome)    probiotic  . Posterior urethral valves   . Premature birth    3 months early  . Renal disorder   . Verbal apraxia    Patient Active Problem List   Diagnosis Date Noted  . Major depressive disorder, recurrent episode, moderate (HCC) 06/04/2016    Priority: High  . Verbal apraxia     Priority: High  . Posterior urethral valves (PUV) 06/04/2016    Priority: Medium  . Essential hypertension 06/04/2016    Priority: Medium  . Bladder spasms 06/04/2016    Priority: Medium  . GAD (generalized anxiety disorder) 06/04/2016    Priority: Medium  . CKD (chronic kidney disease), stage II 06/04/2016    Priority: Medium  . Mesenteric lymphadenopathy 07/27/2016    Priority: Low  . GERD (gastroesophageal reflux disease) 06/04/2016    Priority: Low  . Premature birth     Priority: Low  . IBS (irritable bowel syndrome)     Priority: Low  . MDD (major depressive disorder) 05/04/2018  . Social anxiety disorder 05/04/2018  . Insomnia 05/04/2018   Past Surgical History:  Procedure Laterality Date  . ESOPHAGEAL MANOMETRY N/A 01/23/2016   Procedure: ESOPHAGEAL MANOMETRY (EM);  Surgeon: Charna Elizabeth, MD;  Location: [facility];  Service: Endoscopy;  Laterality: N/A;  . LASIK     eye surgery july 2017  . NEPHRECTOMY     rght   . TONSILLECTOMY      Family History  Problem Relation Age of Onset  . Breast cancer Mother        survivor  . Hypertension Mother   . Hyperlipidemia Mother   . Heart disease  Maternal Grandfather     Medications- reviewed and updated Current Outpatient Medications  Medication Sig Dispense Refill  . ALPRAZolam (XANAX) 1 MG tablet Take 1 mg by mouth 2 (two) times daily as needed for anxiety. 1/2 - 1 bid prn    . losartan (COZAAR) 100 MG tablet Take 1 tablet by mouth every evening.     . Multiple Vitamins-Minerals (MULTIVITAMIN ADULT PO) Take 1 tablet by mouth daily.    . Vilazodone HCl (VIIBRYD) 40 MG TABS Take 40 mg by mouth daily.     No current facility-administered medications for this visit.     Allergies-reviewed and updated Allergies  Allergen Reactions  . Lisinopril Other (See Comments)  . Other Other (See Comments)    Pain med? Unsure of medication, caused n/v    Social History   Social History Narrative   Single. Lives with mom and dad. His brother and wife and nephew also live with him. Brother and sister- healthy.       Full time at [employer]. Studying psychology with biology minor.    Thinking grad school for psychology- would plan for research over clinical    Objective: BP 118/66 (BP Location: Left Arm, Patient Position: Sitting, Cuff Size: Normal)   Pulse 81   Temp 98.7 F (37.1 C) (Oral)   Ht 6\' 1"  (1.854 m)   Wt  163 lb 6.1 oz (74.1 kg)   SpO2 98%   BMI 21.56 kg/m  Gen: NAD, resting comfortably HEENT: Mucous membranes are moist. Oropharynx normal Neck: no thyromegaly CV: RRR no murmurs rubs or gallops Lungs: CTAB no crackles, wheeze, rhonchi Abdomen: soft/nontender/nondistended/normal bowel sounds. No rebound or guarding.  Ext: no edema Skin: warm, dry Neuro: grossly normal, moves all extremities, PERRLA, some stuttering  Assessment/Plan:  22 y.o. male presenting for annual physical.  Health Maintenance counseling: 1. Anticipatory guidance: Patient counseled regarding regular dental exams -q6 months, eye exams -yearly,  avoiding smoking and second hand smoke, limiting alcohol to 2 beverages per day- doesn't  really drink much.   2. Risk factor reduction:  Advised patient of need for regular exercise and diet rich and fruits and vegetables to reduce risk of heart attack and stroke. Exercise- 2 days a week. Diet-reasonably balanced.  Wt Readings from Last 3 Encounters:  05/22/18 163 lb 6.1 oz (74.1 kg)  04/01/17 159 lb 14.4 oz (72.5 kg)  03/29/17 158 lb (71.7 kg)  3. Immunizations/screenings/ancillary studies- flu shot today. Tdap given as well- last in 2008. Discussed HIV shot.  Immunization History  Administered Date(s) Administered  . Influenza,inj,Quad PF,6+ Mos 05/22/2018  . Influenza-Unspecified 03/08/2016  4. Prostate cancer screening- no family history, start at age 70-55  5. Colon cancer screening - no family history, start at age 22-50 6. Skin cancer screening/prevention- no dermatologist. advised regular sunscreen use. Denies worrisome, changing, or new skin lesions.  7. Testicular cancer screening- advised monthly self exams  8. STD screening- patient opts out- has not had unprotected sex 9. Never smoker  Status of chronic or acute concerns   Low vitamin D in past. Had been on in past around age 69  b12- he worries about with his long term medications and kidney issues- asks me to update this  Mesenteric lymphadenopathy Resolved on repeat scan in 2018. Initially- During time of likely norovirus on CT.   Essential hypertension S: controlled on losartan 100 mg- feels sick when BP goes up so didn't tolerate lower dose as had higher BPs. BP Readings from Last 3 Encounters:  05/22/18 118/66  04/01/17 130/80  03/29/17 123/88  A/P: We discussed blood pressure goal of <140/90. Continue current meds  CKD (chronic kidney disease), stage II S: States CKD I has progresse dto stage II and has follow up January 9th. Also has visit with new urologist at [facility]- December 23rd- graduated/transferred to adult care from Dr. Zenaida Deed A/P: suspect stable- update cmet  Major depressive disorder,  recurrent episode, moderate (HCC) S: James Matthews is managing depression- he is on viibryd 40mg . Also seeing Dr. Jason Fila once a week. Also on xanax 1 mg up to twice a day  thourhg Ms. Hurst.  A/p: stable- had flare recently but wants to stay on current dose for now as has mild fluctuations from time to time. No SI.   Future Appointments  Date Time Provider Department Center  05/27/2018 11:30 AM Joana Reamer, PhD [facility] None  06/03/2018 11:30 AM Joana Reamer, PhD [facility] None  06/10/2018 11:30 AM Joana Reamer, PhD [facility] None  06/17/2018 11:30 AM Joana Reamer, PhD [facility] None  06/24/2018 11:30 AM Joana Reamer, PhD [facility] None   No follow-ups on file.  Lab/Order associations: Preventative health care - Plan: CBC, Comprehensive metabolic panel, Lipid panel, TSH, Vitamin B12, VITAMIN D 25 Hydroxy (Vit-D Deficiency, Fractures), HIV Antibody (routine testing w rflx)  CKD (chronic kidney disease), stage  II  Major depressive disorder, recurrent episode, moderate (HCC)  High risk medication use - Plan: Vitamin B12  Essential hypertension - Plan: CBC, Comprehensive metabolic panel, Lipid panel, TSH  Vitamin D deficiency - Plan: VITAMIN D 25 Hydroxy (Vit-D Deficiency, Fractures)  Mesenteric lymphadenopathy  Family history of thyroid disease in mother - Plan: TSH  Need for prophylactic vaccination and inoculation against influenza - Plan: CANCELED: Flu Vaccine QUAD 6+ mos PF IM (Fluarix Quad PF)  Need for immunization against influenza - Plan: Flu Vaccine QUAD 36+ mos IM  Screening for HIV (human immunodeficiency virus) - Plan: HIV Antibody (routine testing w rflx)  Need for prophylactic vaccination with combined diphtheria-tetanus-pertussis (DTP) vaccine - Plan: Tdap vaccine greater than or equal to 7yo IM  Return precautions advised.   Tana ConchStephen Jetaun Colbath, MD

## 2018-05-22 NOTE — Assessment & Plan Note (Signed)
S: controlled on losartan 100 mg- feels sick when BP goes up so didn't tolerate lower dose as had higher BPs. BP Readings from Last 3 Encounters:  05/22/18 118/66  04/01/17 130/80  03/29/17 123/88  A/P: We discussed blood pressure goal of <140/90. Continue current meds

## 2018-05-22 NOTE — Patient Instructions (Addendum)
Health Maintenance Due  Topic Date Due  . HIV Screening  11/04/2010  . TETANUS/TDAP - thanks for doing Tdap today 11/04/2014  . INFLUENZA VACCINE - Thanks for doing your flu shot today.  02/06/2018   We opted to do a physical today- your copay can be refunded at the desk  Please stop by lab before you go

## 2018-05-22 NOTE — Assessment & Plan Note (Signed)
Resolved on repeat scan in 2018. Initially- During time of likely norovirus on CT.

## 2018-05-23 LAB — COMPREHENSIVE METABOLIC PANEL
ALT: 16 U/L (ref 0–53)
AST: 16 U/L (ref 0–37)
Albumin: 5 g/dL (ref 3.5–5.2)
Alkaline Phosphatase: 37 U/L — ABNORMAL LOW (ref 39–117)
BUN: 12 mg/dL (ref 6–23)
CO2: 29 mEq/L (ref 19–32)
Calcium: 10.2 mg/dL (ref 8.4–10.5)
Chloride: 104 mEq/L (ref 96–112)
Creatinine, Ser: 1.22 mg/dL (ref 0.40–1.50)
GFR: 78.55 mL/min (ref >60.00)
Glucose, Bld: 83 mg/dL (ref 70–99)
Potassium: 4.2 mEq/L (ref 3.5–5.1)
Sodium: 141 mEq/L (ref 135–145)
Total Bilirubin: 0.8 mg/dL (ref 0.2–1.2)
Total Protein: 7.3 g/dL (ref 6.0–8.3)

## 2018-05-23 LAB — LIPID PANEL
Cholesterol: 117 mg/dL (ref 0–200)
HDL: 39.2 mg/dL (ref >39.00)
LDL Cholesterol: 64 mg/dL (ref 0–99)
NonHDL: 77.84
Total CHOL/HDL Ratio: 3
Triglycerides: 70 mg/dL (ref 0.0–149.0)
VLDL: 14 mg/dL (ref 0.0–40.0)

## 2018-05-23 LAB — VITAMIN D 25 HYDROXY (VIT D DEFICIENCY, FRACTURES): VITD: 44.72 ng/mL (ref 30.00–100.00)

## 2018-05-23 LAB — CBC
HCT: 42.2 % (ref 39.0–52.0)
Hemoglobin: 14.9 g/dL (ref 13.0–17.0)
MCHC: 35.3 g/dL (ref 30.0–36.0)
MCV: 93.5 fl (ref 78.0–100.0)
Platelets: 258 10*3/uL (ref 150.0–400.0)
RBC: 4.51 Mil/uL (ref 4.22–5.81)
RDW: 12.5 % (ref 11.5–15.5)
WBC: 6.7 10*3/uL (ref 4.0–10.5)

## 2018-05-23 LAB — HIV ANTIBODY (ROUTINE TESTING W REFLEX): HIV 1&2 Ab, 4th Generation: NONREACTIVE

## 2018-05-23 LAB — VITAMIN B12: Vitamin B-12: 382 pg/mL (ref 211–911)

## 2018-05-23 LAB — TSH: TSH: 4.2 u[IU]/mL (ref 0.35–4.50)

## 2018-05-27 ENCOUNTER — Telehealth: Payer: Self-pay | Admitting: Family Medicine

## 2018-05-27 ENCOUNTER — Ambulatory Visit (INDEPENDENT_AMBULATORY_CARE_PROVIDER_SITE_OTHER): Payer: BLUE CROSS/BLUE SHIELD | Admitting: Psychology

## 2018-05-27 DIAGNOSIS — F341 Dysthymic disorder: Secondary | ICD-10-CM

## 2018-05-27 DIAGNOSIS — F064 Anxiety disorder due to known physiological condition: Secondary | ICD-10-CM | POA: Diagnosis not present

## 2018-05-27 NOTE — Telephone Encounter (Signed)
Called and spoke with patient who verbalized understanding.  

## 2018-05-27 NOTE — Telephone Encounter (Signed)
Copied from CRM [phone]. Topic: General - Inquiry >> May 27, 2018  3:00 PM Baldo DaubAlexander, Amber L wrote: Reason for CRM:   Pt called office to speak with Asher MuirJamie who was calling with lab results.  Per NT it's not noted that we can give.  Called FC, but pt hung up while on hold.

## 2018-06-03 ENCOUNTER — Ambulatory Visit (INDEPENDENT_AMBULATORY_CARE_PROVIDER_SITE_OTHER): Payer: BLUE CROSS/BLUE SHIELD | Admitting: Psychology

## 2018-06-03 DIAGNOSIS — F064 Anxiety disorder due to known physiological condition: Secondary | ICD-10-CM

## 2018-06-03 DIAGNOSIS — F341 Dysthymic disorder: Secondary | ICD-10-CM | POA: Diagnosis not present

## 2018-06-10 ENCOUNTER — Ambulatory Visit (INDEPENDENT_AMBULATORY_CARE_PROVIDER_SITE_OTHER): Payer: BLUE CROSS/BLUE SHIELD | Admitting: Psychology

## 2018-06-10 DIAGNOSIS — F341 Dysthymic disorder: Secondary | ICD-10-CM

## 2018-06-10 DIAGNOSIS — F064 Anxiety disorder due to known physiological condition: Secondary | ICD-10-CM

## 2018-06-17 ENCOUNTER — Ambulatory Visit (INDEPENDENT_AMBULATORY_CARE_PROVIDER_SITE_OTHER): Payer: BLUE CROSS/BLUE SHIELD | Admitting: Psychology

## 2018-06-17 DIAGNOSIS — F341 Dysthymic disorder: Secondary | ICD-10-CM

## 2018-06-17 DIAGNOSIS — F064 Anxiety disorder due to known physiological condition: Secondary | ICD-10-CM

## 2018-06-18 ENCOUNTER — Ambulatory Visit (INDEPENDENT_AMBULATORY_CARE_PROVIDER_SITE_OTHER): Payer: BLUE CROSS/BLUE SHIELD | Admitting: Physician Assistant

## 2018-06-18 ENCOUNTER — Encounter: Payer: Self-pay | Admitting: Physician Assistant

## 2018-06-18 DIAGNOSIS — F4010 Social phobia, unspecified: Secondary | ICD-10-CM | POA: Diagnosis not present

## 2018-06-18 DIAGNOSIS — G4700 Insomnia, unspecified: Secondary | ICD-10-CM | POA: Diagnosis not present

## 2018-06-18 DIAGNOSIS — F331 Major depressive disorder, recurrent, moderate: Secondary | ICD-10-CM | POA: Diagnosis not present

## 2018-06-18 MED ORDER — BUSPIRONE HCL 15 MG PO TABS: ORAL_TABLET | ORAL | 1 refills | Status: DC

## 2018-06-18 MED ORDER — ALPRAZOLAM 1 MG PO TABS: 1.0000 mg | ORAL_TABLET | Freq: Two times a day (BID) | ORAL | 5 refills | Status: DC | PRN

## 2018-06-18 MED ORDER — VILAZODONE HCL 40 MG PO TABS: 40.0000 mg | ORAL_TABLET | Freq: Every day | ORAL | 5 refills | Status: DC
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
## 2018-06-24 ENCOUNTER — Ambulatory Visit (INDEPENDENT_AMBULATORY_CARE_PROVIDER_SITE_OTHER): Payer: BLUE CROSS/BLUE SHIELD | Admitting: Psychology

## 2018-06-24 DIAGNOSIS — F341 Dysthymic disorder: Secondary | ICD-10-CM | POA: Diagnosis not present

## 2018-06-24 DIAGNOSIS — F064 Anxiety disorder due to known physiological condition: Secondary | ICD-10-CM

## 2018-06-30 DIAGNOSIS — N181 Chronic kidney disease, stage 1: Secondary | ICD-10-CM | POA: Diagnosis not present

## 2018-06-30 DIAGNOSIS — Q642 Congenital posterior urethral valves: Secondary | ICD-10-CM | POA: Diagnosis not present

## 2018-06-30 DIAGNOSIS — Q600 Renal agenesis, unilateral: Secondary | ICD-10-CM | POA: Diagnosis not present

## 2018-06-30 DIAGNOSIS — N200 Calculus of kidney: Secondary | ICD-10-CM | POA: Diagnosis not present

## 2018-06-30 DIAGNOSIS — N1330 Unspecified hydronephrosis: Secondary | ICD-10-CM | POA: Diagnosis not present

## 2018-07-08 ENCOUNTER — Ambulatory Visit: Payer: BLUE CROSS/BLUE SHIELD | Admitting: Psychology

## 2018-07-11 ENCOUNTER — Ambulatory Visit: Payer: BLUE CROSS/BLUE SHIELD | Admitting: Physician Assistant

## 2018-07-15 ENCOUNTER — Ambulatory Visit (INDEPENDENT_AMBULATORY_CARE_PROVIDER_SITE_OTHER): Payer: BLUE CROSS/BLUE SHIELD | Admitting: Psychology

## 2018-07-15 DIAGNOSIS — F064 Anxiety disorder due to known physiological condition: Secondary | ICD-10-CM | POA: Diagnosis not present

## 2018-07-15 DIAGNOSIS — F341 Dysthymic disorder: Secondary | ICD-10-CM | POA: Diagnosis not present

## 2018-07-17 ENCOUNTER — Telehealth: Payer: Self-pay

## 2018-07-17 DIAGNOSIS — R3989 Other symptoms and signs involving the genitourinary system: Secondary | ICD-10-CM | POA: Diagnosis not present

## 2018-07-17 DIAGNOSIS — K219 Gastro-esophageal reflux disease without esophagitis: Secondary | ICD-10-CM | POA: Diagnosis not present

## 2018-07-17 DIAGNOSIS — R109 Unspecified abdominal pain: Secondary | ICD-10-CM | POA: Diagnosis not present

## 2018-07-17 DIAGNOSIS — E790 Hyperuricemia without signs of inflammatory arthritis and tophaceous disease: Secondary | ICD-10-CM | POA: Diagnosis not present

## 2018-07-17 DIAGNOSIS — R809 Proteinuria, unspecified: Secondary | ICD-10-CM | POA: Diagnosis not present

## 2018-07-17 DIAGNOSIS — I129 Hypertensive chronic kidney disease with stage 1 through stage 4 chronic kidney disease, or unspecified chronic kidney disease: Secondary | ICD-10-CM | POA: Diagnosis not present

## 2018-07-17 DIAGNOSIS — N181 Chronic kidney disease, stage 1: Secondary | ICD-10-CM | POA: Diagnosis not present

## 2018-07-17 DIAGNOSIS — E875 Hyperkalemia: Secondary | ICD-10-CM | POA: Diagnosis not present

## 2018-07-17 DIAGNOSIS — Z09 Encounter for follow-up examination after completed treatment for conditions other than malignant neoplasm: Secondary | ICD-10-CM | POA: Diagnosis not present

## 2018-07-17 MED ORDER — BUSPIRONE HCL 15 MG PO TABS: ORAL_TABLET | ORAL | 0 refills | Status: DC

## 2018-07-17 NOTE — Telephone Encounter (Signed)
[facility] [address] requesting [facility] rx for buspirone.  Next OV 08/01/2018

## 2018-07-22 ENCOUNTER — Ambulatory Visit (INDEPENDENT_AMBULATORY_CARE_PROVIDER_SITE_OTHER): Payer: BLUE CROSS/BLUE SHIELD | Admitting: Psychology

## 2018-07-22 DIAGNOSIS — F064 Anxiety disorder due to known physiological condition: Secondary | ICD-10-CM | POA: Diagnosis not present

## 2018-07-22 DIAGNOSIS — F341 Dysthymic disorder: Secondary | ICD-10-CM | POA: Diagnosis not present

## 2018-07-29 ENCOUNTER — Ambulatory Visit (INDEPENDENT_AMBULATORY_CARE_PROVIDER_SITE_OTHER): Payer: BLUE CROSS/BLUE SHIELD | Admitting: Psychology

## 2018-07-29 DIAGNOSIS — F064 Anxiety disorder due to known physiological condition: Secondary | ICD-10-CM

## 2018-07-29 DIAGNOSIS — F341 Dysthymic disorder: Secondary | ICD-10-CM

## 2018-08-01 ENCOUNTER — Ambulatory Visit (INDEPENDENT_AMBULATORY_CARE_PROVIDER_SITE_OTHER): Payer: BLUE CROSS/BLUE SHIELD | Admitting: Physician Assistant

## 2018-08-01 ENCOUNTER — Encounter: Payer: Self-pay | Admitting: Physician Assistant

## 2018-08-01 DIAGNOSIS — F4010 Social phobia, unspecified: Secondary | ICD-10-CM

## 2018-08-01 DIAGNOSIS — G4700 Insomnia, unspecified: Secondary | ICD-10-CM

## 2018-08-01 DIAGNOSIS — F331 Major depressive disorder, recurrent, moderate: Secondary | ICD-10-CM | POA: Diagnosis not present

## 2018-08-01 MED ORDER — BUSPIRONE HCL 15 MG PO TABS: ORAL_TABLET | ORAL | 1 refills | Status: DC

## 2018-08-01 MED ORDER — BUSPIRONE HCL 30 MG PO TABS: ORAL_TABLET | ORAL | 1 refills | Status: DC

## 2018-08-01 NOTE — Progress Notes (Signed)
[facility]  Patient ID: James Matthews,  MRN: [id number]  PCP: Shelva Majestic, MD  Date of Evaluation: 08/01/2018 Time spent:15 minutes  Chief Complaint:  Chief Complaint    Follow-up; Anxiety      HISTORY/CURRENT STATUS: HPI  Here for routine follow-up after starting BuSpar last visit.  He states that the BuSpar has helped some.  But he would like to have even less anxiety.  The anxiety is mostly triggered by things at work or school.  He was on the edge of failing a class last semester but he passed and he was very thankful for that.  Some of the anxiety went away after that was over with.  Patient denies loss of interest in usual activities and is able to enjoy things.  Denies decreased energy or motivation.  Appetite has not changed.  No extreme sadness, tearfulness, or feelings of hopelessness.  Denies any changes in concentration, making decisions or remembering things.  Denies suicidal or homicidal thoughts.  He does have some "down days" but they are less frequent than before.  Individual Medical History/ Review of Systems: Changes? :No    Past medications for mental health diagnoses include: Luvox, Xanax, Lexapro, Prozac, Klonopin, Cymbalta, Abilify, Sonata, Viibryd, Ativan, Ambien, Lunesta, trazodone  Allergies: Lisinopril and Other  Current Medications:  Current Outpatient Medications:  .  ALPRAZolam (XANAX) 1 MG tablet, Take 1 tablet (1 mg total) by mouth 2 (two) times daily as needed for anxiety. 1/2 - 1 bid prn, Disp: 60 tablet, Rfl: 5 .  losartan (COZAAR) 100 MG tablet, Take 1 tablet by mouth every evening. , Disp: , Rfl:  .  Vilazodone HCl (VIIBRYD) 40 MG TABS, Take 1 tablet (40 mg total) by mouth daily., Disp: 30 tablet, Rfl: 5 .  busPIRone (BUSPAR) 15 MG tablet, 2 q am, 1 qhs, Disp: 270 tablet, Rfl: 1 .  Multiple Vitamins-Minerals (MULTIVITAMIN ADULT PO), Take 1 tablet by mouth daily., Disp: , Rfl:  Medication Side Effects: none  Family  Medical/ Social History: Changes? No  MENTAL HEALTH EXAM:  There were no vitals taken for this visit.There is no height or weight on file to calculate BMI.  General Appearance: Casual and Well Groomed  Eye Contact:  Good  Speech:  Clear and Coherent apraxia  Volume:  Normal  Mood:  Euthymic  Affect:  Appropriate  Thought Process:  Goal Directed  Orientation:  Full (Time, Place, and Person)  Thought Content: Logical   Suicidal Thoughts:  No  Homicidal Thoughts:  No  Memory:  WNL  Judgement:  Good  Insight:  Good  Psychomotor Activity:  Normal  Concentration:  Concentration: Good  Recall:  Good  Fund of Knowledge: Good  Language: Good  Assets:  Desire for Improvement  ADL's:  Intact  Cognition: WNL  Prognosis:  Good    DIAGNOSES:    ICD-10-CM   1. Major depressive disorder, recurrent episode, moderate (HCC) F33.1   2. Social anxiety disorder F40.10   3. Insomnia, unspecified type G47.00     Receiving Psychotherapy: Yes Dr. Jason Fila   RECOMMENDATIONS: Increase BuSpar 15 mg to 2 every morning and 1 nightly. Continue Viibryd 40 mg p.o. daily. Continue Xanax 1 mg twice daily as needed Continue psychotherapy with Dr. Jason Fila. Return in 6 weeks.  Melony Overly, PA-C

## 2018-08-01 NOTE — Patient Instructions (Signed)
Buspar dose change to 15 mg, take 2 every morning, and 1 at night.

## 2018-08-05 ENCOUNTER — Ambulatory Visit (INDEPENDENT_AMBULATORY_CARE_PROVIDER_SITE_OTHER): Payer: BLUE CROSS/BLUE SHIELD | Admitting: Psychology

## 2018-08-05 DIAGNOSIS — F4010 Social phobia, unspecified: Secondary | ICD-10-CM

## 2018-08-05 DIAGNOSIS — F411 Generalized anxiety disorder: Secondary | ICD-10-CM | POA: Diagnosis not present

## 2018-08-05 DIAGNOSIS — F341 Dysthymic disorder: Secondary | ICD-10-CM

## 2018-08-11 ENCOUNTER — Other Ambulatory Visit: Payer: Self-pay

## 2018-08-11 ENCOUNTER — Telehealth: Payer: Self-pay | Admitting: Physician Assistant

## 2018-08-11 MED ORDER — BUSPIRONE HCL 15 MG PO TABS: ORAL_TABLET | ORAL | 1 refills | Status: DC

## 2018-08-11 NOTE — Telephone Encounter (Signed)
Patient requesting a refill on his Buspar and Xanax. [facility] [address]

## 2018-08-11 NOTE — Telephone Encounter (Signed)
Xanax with 5 refills at pharmacy, buspar resubmitted

## 2018-08-12 ENCOUNTER — Ambulatory Visit (INDEPENDENT_AMBULATORY_CARE_PROVIDER_SITE_OTHER): Payer: BLUE CROSS/BLUE SHIELD | Admitting: Psychology

## 2018-08-12 DIAGNOSIS — F4010 Social phobia, unspecified: Secondary | ICD-10-CM

## 2018-08-12 DIAGNOSIS — F411 Generalized anxiety disorder: Secondary | ICD-10-CM

## 2018-08-12 DIAGNOSIS — F341 Dysthymic disorder: Secondary | ICD-10-CM | POA: Diagnosis not present

## 2018-08-26 ENCOUNTER — Telehealth: Payer: Self-pay | Admitting: Physician Assistant

## 2018-08-26 ENCOUNTER — Ambulatory Visit (INDEPENDENT_AMBULATORY_CARE_PROVIDER_SITE_OTHER): Payer: BLUE CROSS/BLUE SHIELD | Admitting: Psychology

## 2018-08-26 DIAGNOSIS — F341 Dysthymic disorder: Secondary | ICD-10-CM

## 2018-08-26 DIAGNOSIS — F064 Anxiety disorder due to known physiological condition: Secondary | ICD-10-CM

## 2018-08-26 NOTE — Telephone Encounter (Signed)
Patient was advised to call to give an update on his medication Buspar. Stated after taking for 3 wks he is not feeling any better. Please advise.

## 2018-08-26 NOTE — Telephone Encounter (Signed)
Increase Buspar to 30mg  bid.  He can use up the 15mg  pills he has.  Send in Rx for 30mg , #60, no RF, if he needs a Rx

## 2018-08-27 ENCOUNTER — Other Ambulatory Visit: Payer: Self-pay

## 2018-08-27 MED ORDER — BUSPIRONE HCL 30 MG PO TABS: ORAL_TABLET | ORAL | 0 refills | Status: DC

## 2018-08-27 NOTE — Telephone Encounter (Signed)
Tried to reach pt with information but his voicemail box is full

## 2018-08-27 NOTE — Telephone Encounter (Signed)
Pt given information and verbalized understanding. Asking rx sent to [facility] [address]. Instructed to call back with any questions or concerns.

## 2018-09-02 ENCOUNTER — Ambulatory Visit (INDEPENDENT_AMBULATORY_CARE_PROVIDER_SITE_OTHER): Payer: BLUE CROSS/BLUE SHIELD | Admitting: Psychology

## 2018-09-02 DIAGNOSIS — F064 Anxiety disorder due to known physiological condition: Secondary | ICD-10-CM | POA: Diagnosis not present

## 2018-09-02 DIAGNOSIS — F341 Dysthymic disorder: Secondary | ICD-10-CM | POA: Diagnosis not present

## 2018-09-09 ENCOUNTER — Ambulatory Visit (INDEPENDENT_AMBULATORY_CARE_PROVIDER_SITE_OTHER): Payer: BLUE CROSS/BLUE SHIELD | Admitting: Psychology

## 2018-09-09 DIAGNOSIS — F341 Dysthymic disorder: Secondary | ICD-10-CM

## 2018-09-09 DIAGNOSIS — F064 Anxiety disorder due to known physiological condition: Secondary | ICD-10-CM

## 2018-09-12 ENCOUNTER — Ambulatory Visit (INDEPENDENT_AMBULATORY_CARE_PROVIDER_SITE_OTHER): Payer: BLUE CROSS/BLUE SHIELD | Admitting: Physician Assistant

## 2018-09-12 ENCOUNTER — Encounter: Payer: Self-pay | Admitting: Physician Assistant

## 2018-09-12 DIAGNOSIS — F4010 Social phobia, unspecified: Secondary | ICD-10-CM

## 2018-09-12 DIAGNOSIS — F331 Major depressive disorder, recurrent, moderate: Secondary | ICD-10-CM | POA: Diagnosis not present

## 2018-09-12 DIAGNOSIS — G4700 Insomnia, unspecified: Secondary | ICD-10-CM | POA: Diagnosis not present

## 2018-09-12 MED ORDER — TEMAZEPAM 7.5 MG PO CAPS: 7.5000 mg | ORAL_CAPSULE | Freq: Every evening | ORAL | 0 refills | Status: DC | PRN

## 2018-09-12 NOTE — Progress Notes (Signed)
[facility]  Patient ID: James Matthews,  MRN: [id number]  PCP: Shelva Majestic, MD  Date of Evaluation: 09/12/2018 Time spent:15 minutes  Chief Complaint:  Chief Complaint    Insomnia      HISTORY/CURRENT STATUS: HPI For 6 week med check.  Problem with sleeping. His cut off for caffeine is noon. Uses his phone up to 30 mins before he wants to go to sleep. Trouble falling asleep and staying asleep.  Wakes up around 3 a.m. and can't go back to sleep till around 5 or so.  Has to get up at 8 am.  Xanax helps him relax to go to sleep at night, but not in the early morning.  He never feels rested.    Patient denies loss of interest in usual activities and is able to enjoy things.  Denies decreased energy or motivation.  Appetite has not changed.  No extreme sadness, tearfulness, or feelings of hopelessness.  Denies any changes in concentration, making decisions or remembering things.  Denies suicidal or homicidal thoughts.  Anxiety still happens with triggers at school or home.  But xanax helps.  It is also been helpful to have the higher dose of BuSpar.  Denies muscle or joint pain, stiffness, or dystonia.  Denies dizziness, syncope, seizures, numbness, tingling, tremor, tics, unsteady gait, slurred speech, confusion.   Individual Medical History/ Review of Systems: Changes? :No    Past medications for mental health diagnoses include: Luvox, Xanax, Lexapro, Prozac, Klonopin, Cymbalta, Abilify, Sonata, Viibryd, Ativan, Ambien, Lunesta, trazodone  Allergies: Lisinopril and Other  Current Medications:  Current Outpatient Medications:  .  ALPRAZolam (XANAX) 1 MG tablet, Take 1 tablet (1 mg total) by mouth 2 (two) times daily as needed for anxiety. 1/2 - 1 bid prn, Disp: 60 tablet, Rfl: 5 .  busPIRone (BUSPAR) 30 MG tablet, TAKE ONE TABLET TWICE A DAY, Disp: 60 tablet, Rfl: 0 .  losartan (COZAAR) 100 MG tablet, Take 1 tablet by mouth every evening. , Disp: , Rfl:   .  Vilazodone HCl (VIIBRYD) 40 MG TABS, Take 1 tablet (40 mg total) by mouth daily., Disp: 30 tablet, Rfl: 5 .  Multiple Vitamins-Minerals (MULTIVITAMIN ADULT PO), Take 1 tablet by mouth daily., Disp: , Rfl:  .  temazepam (RESTORIL) 7.5 MG capsule, Take 1-2 capsules (7.5-15 mg total) by mouth at bedtime as needed for sleep., Disp: 30 capsule, Rfl: 0 Medication Side Effects: none  Family Medical/ Social History: Changes? Put in his notice at work.  MENTAL HEALTH EXAM:  There were no vitals taken for this visit.There is no height or weight on file to calculate BMI.  General Appearance: Casual and Well Groomed looks tired.   Eye Contact:  Good  Speech:  Clear and Coherent apraxic  Volume:  Normal  Mood:  Euthymic  Affect:  Appropriate  Thought Process:  Goal Directed  Orientation:  Full (Time, Place, and Person)  Thought Content: Logical   Suicidal Thoughts:  No  Homicidal Thoughts:  No  Memory:  WNL  Judgement:  Good  Insight:  Good  Psychomotor Activity:  Normal  Concentration:  Concentration: Good and Attention Span: Good  Recall:  Good  Fund of Knowledge: Good  Language: Good  Assets:  Desire for Improvement  ADL's:  Intact  Cognition: WNL  Prognosis:  Good    DIAGNOSES:    ICD-10-CM   1. Insomnia, unspecified type G47.00   2. Major depressive disorder, recurrent episode, moderate (HCC) F33.1   3. Social  anxiety disorder F40.10     Receiving Psychotherapy: Yes  Dr. Jason Fila weekly    RECOMMENDATIONS: Sleep hygiene was discussed including buying amber-colored blue light blocking glasses. Add Restoril 7.5 mg 1-2 p.o. nightly as needed sleep. Continue Xanax 1 mg twice daily as needed.  He knows not to take this within 4 to 6 hours at the time that he takes the Restoril. Continue BuSpar 30 mg twice daily. Continue Viibryd 40 mg p.o. daily. Continue psychotherapy with Dr. Jason Fila. Return in 4 to 6 weeks.   Melony Overly, PA-C

## 2018-09-15 ENCOUNTER — Telehealth: Payer: Self-pay | Admitting: Physician Assistant

## 2018-09-15 NOTE — Telephone Encounter (Signed)
[place] called to report that the sleep medication you prescribed is not working.  He did go up to the 15 mg for the last 3 nights but he is still waking up in the middle of the night. Please call with further instructions. Next appt is 10/24/18

## 2018-09-15 NOTE — Telephone Encounter (Signed)
Take 3 qhs and see how that does.

## 2018-09-16 ENCOUNTER — Ambulatory Visit (INDEPENDENT_AMBULATORY_CARE_PROVIDER_SITE_OTHER): Payer: BLUE CROSS/BLUE SHIELD | Admitting: Psychology

## 2018-09-16 DIAGNOSIS — F064 Anxiety disorder due to known physiological condition: Secondary | ICD-10-CM

## 2018-09-16 DIAGNOSIS — F341 Dysthymic disorder: Secondary | ICD-10-CM

## 2018-09-16 NOTE — Telephone Encounter (Signed)
Pt given information and verbalized understanding.  

## 2018-09-16 NOTE — Telephone Encounter (Signed)
Mail box is full unable to leave a voicemail to call back for instructions

## 2018-09-17 ENCOUNTER — Telehealth: Payer: Self-pay | Admitting: Physician Assistant

## 2018-09-17 NOTE — Telephone Encounter (Signed)
Patient stated the new medication that was increased for the second time is not working. Please advise.

## 2018-09-18 NOTE — Telephone Encounter (Signed)
I don't think he's tried Mirtazepine. Send in Rx for 7.5mg  #60 1 qhs prn.  No RF  If 1 doesn't work after 2-3 days, he can increase to 2 pills.  Remind him about not being on his phone/tablet wthin 2 hours of sleep time, not bed time.  Also, get the amber colored blue blocker glasses if he's not using them already.  I'm pretty sure we've discussed.

## 2018-09-19 ENCOUNTER — Other Ambulatory Visit: Payer: Self-pay

## 2018-09-19 MED ORDER — MIRTAZAPINE 7.5 MG PO TABS: 7.5000 mg | ORAL_TABLET | Freq: Every day | ORAL | 0 refills | Status: DC

## 2018-09-19 NOTE — Telephone Encounter (Signed)
Mailbox full unable to leave message.

## 2018-09-19 NOTE — Telephone Encounter (Signed)
Pt given instructions and verbalized understanding. [facility]

## 2018-09-23 ENCOUNTER — Ambulatory Visit (INDEPENDENT_AMBULATORY_CARE_PROVIDER_SITE_OTHER): Payer: BLUE CROSS/BLUE SHIELD | Admitting: Psychology

## 2018-09-23 DIAGNOSIS — F341 Dysthymic disorder: Secondary | ICD-10-CM

## 2018-09-23 DIAGNOSIS — F064 Anxiety disorder due to known physiological condition: Secondary | ICD-10-CM

## 2018-09-26 ENCOUNTER — Other Ambulatory Visit: Payer: Self-pay | Admitting: Physician Assistant

## 2018-09-30 ENCOUNTER — Ambulatory Visit (INDEPENDENT_AMBULATORY_CARE_PROVIDER_SITE_OTHER): Payer: BLUE CROSS/BLUE SHIELD | Admitting: Psychology

## 2018-09-30 ENCOUNTER — Other Ambulatory Visit: Payer: Self-pay | Admitting: Physician Assistant

## 2018-09-30 ENCOUNTER — Telehealth: Payer: Self-pay | Admitting: Physician Assistant

## 2018-09-30 DIAGNOSIS — F341 Dysthymic disorder: Secondary | ICD-10-CM | POA: Diagnosis not present

## 2018-09-30 DIAGNOSIS — F064 Anxiety disorder due to known physiological condition: Secondary | ICD-10-CM

## 2018-09-30 MED ORDER — AMITRIPTYLINE HCL 25 MG PO TABS: 25.0000 mg | ORAL_TABLET | Freq: Every evening | ORAL | 1 refills | Status: DC | PRN

## 2018-09-30 NOTE — Telephone Encounter (Signed)
[place] states the medication Remeron is not working. He stated he is wetting the bed and it's still not helping him to sleep after taking for 1and 1/2 weeks. Please advise.

## 2018-10-07 ENCOUNTER — Ambulatory Visit (INDEPENDENT_AMBULATORY_CARE_PROVIDER_SITE_OTHER): Payer: BLUE CROSS/BLUE SHIELD | Admitting: Psychology

## 2018-10-07 DIAGNOSIS — F064 Anxiety disorder due to known physiological condition: Secondary | ICD-10-CM | POA: Diagnosis not present

## 2018-10-07 DIAGNOSIS — F341 Dysthymic disorder: Secondary | ICD-10-CM | POA: Diagnosis not present

## 2018-10-09 ENCOUNTER — Telehealth: Payer: Self-pay | Admitting: Physician Assistant

## 2018-10-09 NOTE — Telephone Encounter (Signed)
Traci please call him and have him increase the Amitriptyline to 50mg  (2, 25mg  which he has)

## 2018-10-09 NOTE — Telephone Encounter (Signed)
James Matthews called to report that the new sleep medication is not working.  It is actually waking him up at night.  Sometimes 3 times/night.  Please call to discuss other options.  Next appt 10/13/18

## 2018-10-10 NOTE — Telephone Encounter (Signed)
James Matthews tried reaching pt but voicemail full

## 2018-10-13 ENCOUNTER — Encounter: Payer: Self-pay | Admitting: Physician Assistant

## 2018-10-13 ENCOUNTER — Ambulatory Visit (INDEPENDENT_AMBULATORY_CARE_PROVIDER_SITE_OTHER): Payer: BLUE CROSS/BLUE SHIELD | Admitting: Physician Assistant

## 2018-10-13 DIAGNOSIS — F4010 Social phobia, unspecified: Secondary | ICD-10-CM

## 2018-10-13 DIAGNOSIS — F331 Major depressive disorder, recurrent, moderate: Secondary | ICD-10-CM

## 2018-10-13 DIAGNOSIS — G4700 Insomnia, unspecified: Secondary | ICD-10-CM

## 2018-10-13 MED ORDER — ZOLPIDEM TARTRATE 10 MG PO TABS: 10.0000 mg | ORAL_TABLET | Freq: Every evening | ORAL | 0 refills | Status: DC | PRN

## 2018-10-13 NOTE — Progress Notes (Signed)
[facility]  Patient ID: James Matthews,  MRN: [id number]  PCP: Shelva Majestic, MD  Date of Evaluation: 10/13/2018 Time spent:15 minutes  Chief Complaint:  Chief Complaint    Follow-up     Virtual Visit via Telephone Note  I connected with James Christian Nesmith on 10/13/18 at  2:00 PM EDT by telephone and verified that I am speaking with the correct person using two identifiers.   I discussed the limitations, risks, security and privacy concerns of performing an evaluation and management service by telephone and the availability of in person appointments. I also discussed with the patient that there may be a patient responsible charge related to this service. The patient expressed understanding and agreed to proceed.    Melony Overly, PA-C   HISTORY/CURRENT STATUS: HPI For 4 week med check.  Still having trouble sleeping.  We changed from Restoril, to Remeron, then Elavil, b/c nothing was working for insomnia.  States that his whole family has it.  His mother and father as well as brother and sister all have to take something.  The only thing that he can remember that has really been helpful especially for his mom was Ambien, which she was on for several years.  She is not on any meds for it right now but does have trouble sleeping.  When he took Ambien, it was only one time after he had Lasik eye surgery And states it was more to help him relax and rest while his eyes healed.  He is having a little more anxiety right now with the coronavirus pandemic.  He has been writing in his journal taking walks to try to help with the anxiety.  Patient denies loss of interest in usual activities and is able to enjoy things.  Denies decreased energy or motivation.  Appetite has not changed.  No extreme sadness, tearfulness, or feelings of hopelessness.  Denies any changes in concentration, making decisions or remembering things.  Denies suicidal or homicidal  thoughts.  He is doing online classes now that the coronavirus has caused all schools to be closed.  Denies muscle or joint pain, stiffness, or dystonia.  Denies dizziness, syncope, seizures, numbness, tingling, tremor, tics, unsteady gait, slurred speech, confusion.   Individual Medical History/ Review of Systems: Changes? :No    Past medications for mental health diagnoses include: Luvox, Xanax, Lexapro, Prozac, Klonopin caused him to be down,  Cymbalta, Abilify, Sonata, Viibryd, Ativan, Ambien, Lunesta, trazodone, Remeron, Elavil, Restoril  Allergies: Lisinopril and Other  Current Medications:  Current Outpatient Medications:  .  ALPRAZolam (XANAX) 1 MG tablet, Take 1 tablet (1 mg total) by mouth 2 (two) times daily as needed for anxiety. 1/2 - 1 bid prn, Disp: 60 tablet, Rfl: 5 .  busPIRone (BUSPAR) 30 MG tablet, TAKE 1 TABLET BY MOUTH TWICE A DAY, Disp: 180 tablet, Rfl: 0 .  losartan (COZAAR) 100 MG tablet, Take 1 tablet by mouth every evening. , Disp: , Rfl:  .  Multiple Vitamins-Minerals (MULTIVITAMIN ADULT PO), Take 1 tablet by mouth daily., Disp: , Rfl:  .  Vilazodone HCl (VIIBRYD) 40 MG TABS, Take 1 tablet (40 mg total) by mouth daily., Disp: 30 tablet, Rfl: 5 .  zolpidem (AMBIEN) 10 MG tablet, Take 1 tablet (10 mg total) by mouth at bedtime as needed for up to 30 days for sleep., Disp: 30 tablet, Rfl: 0 Medication Side Effects: none  Family Medical/ Social History: Changes? No  MENTAL HEALTH EXAM:  There were no  vitals taken for this visit.There is no height or weight on file to calculate BMI.  General Appearance: phone visit.  Unable to assess  Eye Contact:  unable to assess  Speech:  apraxia  Volume:  Normal  Mood:  Euthymic  Affect:  unable to assess  Thought Process:  Goal Directed  Orientation:  Full (Time, Place, and Person)  Thought Content: Logical   Suicidal Thoughts:  No  Homicidal Thoughts:  No  Memory:  WNL  Judgement:  Good  Insight:  Good   Psychomotor Activity:  unable to assess  Concentration:  Concentration: Good  Recall:  Good  Fund of Knowledge: Good  Language: Good  Assets:  Desire for Improvement  ADL's:  Intact  Cognition: WNL  Prognosis:  Good    DIAGNOSES:    ICD-10-CM   1. Insomnia, unspecified type G47.00   2. Major depressive disorder, recurrent episode, moderate (HCC) F33.1   3. Social anxiety disorder F40.10     Receiving Psychotherapy: Yes Dr. Jason FilaBray   RECOMMENDATIONS: DC the Elavil because it is not effective. Start Ambien 10 mg 1 nightly as needed sleep.  I have encouraged him to bear with it for a few nights, even if he has weird dreams, or wakes up not knowing where he is at the moment.  Things like that are normal when that drug his first use.  Of course call if there are problems. Continue Xanax 1 mg twice daily as needed. Continue BuSpar 30 mg twice daily. Continue Viibryd 40 mg daily. Continue multivitamin daily. Continue psychotherapy with Dr. Jason FilaBray. Return in 4 weeks.   Melony Overlyeresa Anaise Sterbenz, PA-C   This record has been created using [employer] software.  Chart creation errors have been sought, but may not always have been located and corrected. Such creation errors do not reflect on the standard of medical care.

## 2018-10-16 ENCOUNTER — Ambulatory Visit (INDEPENDENT_AMBULATORY_CARE_PROVIDER_SITE_OTHER): Payer: BLUE CROSS/BLUE SHIELD | Admitting: Psychology

## 2018-10-16 DIAGNOSIS — F4010 Social phobia, unspecified: Secondary | ICD-10-CM | POA: Diagnosis not present

## 2018-10-16 DIAGNOSIS — F341 Dysthymic disorder: Secondary | ICD-10-CM

## 2018-10-20 ENCOUNTER — Ambulatory Visit (INDEPENDENT_AMBULATORY_CARE_PROVIDER_SITE_OTHER): Payer: BLUE CROSS/BLUE SHIELD | Admitting: Psychology

## 2018-10-20 DIAGNOSIS — F5101 Primary insomnia: Secondary | ICD-10-CM

## 2018-10-20 DIAGNOSIS — F331 Major depressive disorder, recurrent, moderate: Secondary | ICD-10-CM

## 2018-10-21 ENCOUNTER — Ambulatory Visit (INDEPENDENT_AMBULATORY_CARE_PROVIDER_SITE_OTHER): Payer: BLUE CROSS/BLUE SHIELD | Admitting: Psychology

## 2018-10-21 DIAGNOSIS — F341 Dysthymic disorder: Secondary | ICD-10-CM | POA: Diagnosis not present

## 2018-10-21 DIAGNOSIS — F064 Anxiety disorder due to known physiological condition: Secondary | ICD-10-CM

## 2018-10-24 ENCOUNTER — Ambulatory Visit: Payer: BLUE CROSS/BLUE SHIELD | Admitting: Physician Assistant

## 2018-10-28 ENCOUNTER — Ambulatory Visit (INDEPENDENT_AMBULATORY_CARE_PROVIDER_SITE_OTHER): Payer: BLUE CROSS/BLUE SHIELD | Admitting: Psychology

## 2018-10-28 DIAGNOSIS — F064 Anxiety disorder due to known physiological condition: Secondary | ICD-10-CM | POA: Diagnosis not present

## 2018-10-28 DIAGNOSIS — F341 Dysthymic disorder: Secondary | ICD-10-CM

## 2018-11-04 ENCOUNTER — Ambulatory Visit (INDEPENDENT_AMBULATORY_CARE_PROVIDER_SITE_OTHER): Payer: BLUE CROSS/BLUE SHIELD | Admitting: Psychology

## 2018-11-04 DIAGNOSIS — F064 Anxiety disorder due to known physiological condition: Secondary | ICD-10-CM | POA: Diagnosis not present

## 2018-11-04 DIAGNOSIS — F341 Dysthymic disorder: Secondary | ICD-10-CM

## 2018-11-06 ENCOUNTER — Ambulatory Visit (INDEPENDENT_AMBULATORY_CARE_PROVIDER_SITE_OTHER): Payer: BLUE CROSS/BLUE SHIELD | Admitting: Psychology

## 2018-11-06 DIAGNOSIS — F4322 Adjustment disorder with anxiety: Secondary | ICD-10-CM | POA: Diagnosis not present

## 2018-11-06 DIAGNOSIS — F5101 Primary insomnia: Secondary | ICD-10-CM

## 2018-11-10 ENCOUNTER — Encounter: Payer: Self-pay | Admitting: Physician Assistant

## 2018-11-10 ENCOUNTER — Ambulatory Visit (INDEPENDENT_AMBULATORY_CARE_PROVIDER_SITE_OTHER): Payer: BLUE CROSS/BLUE SHIELD | Admitting: Physician Assistant

## 2018-11-10 ENCOUNTER — Other Ambulatory Visit: Payer: Self-pay

## 2018-11-10 DIAGNOSIS — F4010 Social phobia, unspecified: Secondary | ICD-10-CM

## 2018-11-10 DIAGNOSIS — G4700 Insomnia, unspecified: Secondary | ICD-10-CM | POA: Diagnosis not present

## 2018-11-10 DIAGNOSIS — F331 Major depressive disorder, recurrent, moderate: Secondary | ICD-10-CM

## 2018-11-10 MED ORDER — ZOLPIDEM TARTRATE 10 MG PO TABS: 10.0000 mg | ORAL_TABLET | Freq: Every evening | ORAL | 2 refills | Status: DC | PRN

## 2018-11-10 MED ORDER — BUSPIRONE HCL 30 MG PO TABS: ORAL_TABLET | ORAL | 1 refills | Status: DC

## 2018-11-10 NOTE — Progress Notes (Signed)
[facility]  Patient ID: James Matthews,  MRN: [id number]  PCP: Shelva MajesticHunter, Stephen O, MD  Date of Evaluation: 11/10/2018 Time spent:15 minutes  Chief Complaint:  Chief Complaint    Follow-up     Virtual Visit via Telephone Note  I connected with patient by a video enabled telemedicine application or telephone, with their informed consent, and verified patient privacy and that I am speaking with the correct person using two identifiers.  I am private, in my home and the patient is home.   I discussed the limitations, risks, security and privacy concerns of performing an evaluation and management service by telephone and the availability of in person appointments. I also discussed with the patient that there may be a patient responsible charge related to this service. The patient expressed understanding and agreed to proceed.   I discussed the assessment and treatment plan with the patient. The patient was provided an opportunity to ask questions and all were answered. The patient agreed with the plan and demonstrated an understanding of the instructions.   The patient was advised to call back or seek an in-person evaluation if the symptoms worsen or if the condition fails to improve as anticipated.  I provided 15 minutes of non-face-to-face time during this encounter.  HISTORY/CURRENT STATUS: HPI for f/u after adding Ambien.  For past few weeks, has been sleeping better!  Started taking CBD oil, keeping a sleep log as recommended by his therapist, [name].  Feels that the Ambien is helping, although he still wakes up some.  Able to go back to sleep though, where as he wasn't before. Now getting about 8-9 hours of sleep.  He feels much more rested when he gets up.  He is also worked on his sleep hygiene, not using electronics late in the evening.  He still has mood at times.  It is not as it had been, but he feels a little down off and on and not wanting to do much.  His  energy and motivation are low during those times.  They can last just a few hours or a day or so.  He is not sure how much it may be related to his previous lack of sleep or to the pandemic and quarantine over the past 6 weeks or so.  The anxiety is controlled at least most of the time.  He will sometimes wake up in the middle of the night and feels panicky.  He does not think he is having nightmares that cause this.  It just happens.  And when he is anxious that she usually worse in the evenings.  Denies muscle or joint pain, stiffness, or dystonia. Denies dizziness, syncope, seizures, numbness, tingling, tremor, tics, unsteady gait, slurred speech, confusion.   Individual Medical History/ Review of Systems: Changes? :No    Past medications for mental health diagnoses include: Luvox, Xanax, Lexapro, Prozac, Klonopin caused him to be down,  Cymbalta, Abilify, Sonata, Viibryd, Ativan, Ambien, Lunesta, trazodone, Remeron, Elavil, Restoril  Allergies: Lisinopril and Other  Current Medications:  Current Outpatient Medications:  .  ALPRAZolam (XANAX) 1 MG tablet, Take 1 tablet (1 mg total) by mouth 2 (two) times daily as needed for anxiety. 1/2 - 1 bid prn, Disp: 60 tablet, Rfl: 5 .  busPIRone (BUSPAR) 30 MG tablet, TAKE 1 TABLET BY MOUTH TWICE A DAY, Disp: 180 tablet, Rfl: 1 .  losartan (COZAAR) 100 MG tablet, Take 1 tablet by mouth every evening. , Disp: , Rfl:  .  Multiple Vitamins-Minerals (MULTIVITAMIN ADULT PO), Take 1 tablet by mouth daily., Disp: , Rfl:  .  Vilazodone HCl (VIIBRYD) 40 MG TABS, Take 1 tablet (40 mg total) by mouth daily., Disp: 30 tablet, Rfl: 5 .  zolpidem (AMBIEN) 10 MG tablet, Take 1 tablet (10 mg total) by mouth at bedtime as needed for up to 30 days for sleep., Disp: 30 tablet, Rfl: 2 Medication Side Effects: none  Family Medical/ Social History: Changes? Yes just from the shelter in place for coronavirus pandemic   MENTAL HEALTH EXAM:  There were no vitals taken  for this visit.There is no height or weight on file to calculate BMI.  General Appearance: unable to assess  Eye Contact:  unable to assess  Speech:  Clear and Coherent and clipped.  Apraxic  Volume:  Normal  Mood:  Euthymic  Affect:  unable to assess  Thought Process:  Goal Directed  Orientation:  Full (Time, Place, and Person)  Thought Content: Logical   Suicidal Thoughts:  No  Homicidal Thoughts:  No  Memory:  WNL  Judgement:  Good  Insight:  Good  Psychomotor Activity:  unable to assess  Concentration:  Concentration: Good  Recall:  Good  Fund of Knowledge: Good  Language: Good  Assets:  Desire for Improvement  ADL's:  Intact  Cognition: WNL  Prognosis:  Good    DIAGNOSES:    ICD-10-CM   1. Insomnia, unspecified type G47.00   2. Social anxiety disorder F40.10   3. Major depressive disorder, recurrent episode, moderate (HCC) F33.1     Receiving Psychotherapy: Yes  [name] at [facility]    RECOMMENDATIONS: I am glad to know he is doing better with the Ambien, CBD oil, and lifestyle changes he is making.  Of course CBD oil is not FDA approved and I cannot recommend its use however he can choose to use it if he wants to. Continue Ambien 10 mg nightly as needed. Continue Xanax 1 mg twice daily as needed. Continue BuSpar 30 mg 1 twice daily. Continue Viibryd 40 mg daily. Continue sleep hygiene. Continue psychotherapy with Aurther Loft at [facility] in 4 to 6 weeks.  Melony Overly, PA-C   This record has been created using [employer].  Chart creation errors have been sought, but may not always have been located and corrected. Such creation errors do not reflect on the standard of medical care.

## 2018-11-11 ENCOUNTER — Ambulatory Visit (INDEPENDENT_AMBULATORY_CARE_PROVIDER_SITE_OTHER): Payer: BLUE CROSS/BLUE SHIELD | Admitting: Psychology

## 2018-11-11 DIAGNOSIS — F341 Dysthymic disorder: Secondary | ICD-10-CM

## 2018-11-11 DIAGNOSIS — F064 Anxiety disorder due to known physiological condition: Secondary | ICD-10-CM | POA: Diagnosis not present

## 2018-11-18 ENCOUNTER — Ambulatory Visit (INDEPENDENT_AMBULATORY_CARE_PROVIDER_SITE_OTHER): Payer: BLUE CROSS/BLUE SHIELD | Admitting: Psychology

## 2018-11-18 DIAGNOSIS — F064 Anxiety disorder due to known physiological condition: Secondary | ICD-10-CM

## 2018-11-18 DIAGNOSIS — F341 Dysthymic disorder: Secondary | ICD-10-CM | POA: Diagnosis not present

## 2018-11-20 ENCOUNTER — Telehealth: Payer: Self-pay | Admitting: Physician Assistant

## 2018-11-20 ENCOUNTER — Ambulatory Visit (INDEPENDENT_AMBULATORY_CARE_PROVIDER_SITE_OTHER): Payer: BLUE CROSS/BLUE SHIELD | Admitting: Psychology

## 2018-11-20 DIAGNOSIS — F4322 Adjustment disorder with anxiety: Secondary | ICD-10-CM | POA: Diagnosis not present

## 2018-11-20 DIAGNOSIS — F5101 Primary insomnia: Secondary | ICD-10-CM

## 2018-11-20 NOTE — Telephone Encounter (Signed)
Tried to reach pt but no answer and mail box full. Will try to reach later.

## 2018-11-20 NOTE — Telephone Encounter (Signed)
I've reviewed my notes all the way back to Dec and not sure what he's meaning.  Can you please triage?

## 2018-11-20 NOTE — Telephone Encounter (Signed)
Pt would like to speak with you concerning SSRI previously discussed

## 2018-11-21 NOTE — Telephone Encounter (Signed)
Tried to reach pt again, mail box still full unable to leave voicemail

## 2018-11-25 ENCOUNTER — Ambulatory Visit (INDEPENDENT_AMBULATORY_CARE_PROVIDER_SITE_OTHER): Payer: BLUE CROSS/BLUE SHIELD | Admitting: Psychology

## 2018-11-25 DIAGNOSIS — F064 Anxiety disorder due to known physiological condition: Secondary | ICD-10-CM | POA: Diagnosis not present

## 2018-11-25 DIAGNOSIS — F341 Dysthymic disorder: Secondary | ICD-10-CM | POA: Diagnosis not present

## 2018-11-26 NOTE — Telephone Encounter (Signed)
Pt. Did not answer. I was able to leave a message for him to call back.

## 2018-11-28 NOTE — Telephone Encounter (Signed)
Spoke with pt. He says you and him talked about changing the Viibryd. He said his counselor and him talked and  agree that changing him to a different SSRI could be more beneficial to him. He states that he has had more depression lately. I told him you may want to wait for the appt on 06/04 to discuss this in person  but if you decided to go ahead and change it that I would call him back. He said that would be fine.

## 2018-12-02 ENCOUNTER — Ambulatory Visit (INDEPENDENT_AMBULATORY_CARE_PROVIDER_SITE_OTHER): Payer: BLUE CROSS/BLUE SHIELD | Admitting: Psychology

## 2018-12-02 DIAGNOSIS — F064 Anxiety disorder due to known physiological condition: Secondary | ICD-10-CM

## 2018-12-02 DIAGNOSIS — F341 Dysthymic disorder: Secondary | ICD-10-CM

## 2018-12-02 NOTE — Telephone Encounter (Signed)
Pt. Made aware.

## 2018-12-02 NOTE — Telephone Encounter (Signed)
Will discuss at OV

## 2018-12-05 ENCOUNTER — Ambulatory Visit (INDEPENDENT_AMBULATORY_CARE_PROVIDER_SITE_OTHER): Payer: BLUE CROSS/BLUE SHIELD | Admitting: Psychology

## 2018-12-05 DIAGNOSIS — F5101 Primary insomnia: Secondary | ICD-10-CM | POA: Diagnosis not present

## 2018-12-05 DIAGNOSIS — F4322 Adjustment disorder with anxiety: Secondary | ICD-10-CM | POA: Diagnosis not present

## 2018-12-09 ENCOUNTER — Ambulatory Visit (INDEPENDENT_AMBULATORY_CARE_PROVIDER_SITE_OTHER): Payer: BC Managed Care – PPO | Admitting: Psychology

## 2018-12-09 DIAGNOSIS — F411 Generalized anxiety disorder: Secondary | ICD-10-CM | POA: Diagnosis not present

## 2018-12-09 DIAGNOSIS — F4010 Social phobia, unspecified: Secondary | ICD-10-CM | POA: Diagnosis not present

## 2018-12-09 DIAGNOSIS — F341 Dysthymic disorder: Secondary | ICD-10-CM

## 2018-12-11 ENCOUNTER — Encounter: Payer: Self-pay | Admitting: Physician Assistant

## 2018-12-11 ENCOUNTER — Other Ambulatory Visit: Payer: Self-pay

## 2018-12-11 ENCOUNTER — Telehealth: Payer: Self-pay | Admitting: Physician Assistant

## 2018-12-11 ENCOUNTER — Ambulatory Visit (INDEPENDENT_AMBULATORY_CARE_PROVIDER_SITE_OTHER): Payer: BC Managed Care – PPO | Admitting: Physician Assistant

## 2018-12-11 DIAGNOSIS — F4010 Social phobia, unspecified: Secondary | ICD-10-CM

## 2018-12-11 DIAGNOSIS — F331 Major depressive disorder, recurrent, moderate: Secondary | ICD-10-CM

## 2018-12-11 DIAGNOSIS — G4700 Insomnia, unspecified: Secondary | ICD-10-CM | POA: Diagnosis not present

## 2018-12-11 MED ORDER — ALPRAZOLAM 1 MG PO TABS: ORAL_TABLET | ORAL | 1 refills | Status: DC

## 2018-12-11 MED ORDER — DESVENLAFAXINE SUCCINATE ER 50 MG PO TB24: 50.0000 mg | ORAL_TABLET | Freq: Every day | ORAL | 1 refills | Status: DC

## 2018-12-11 NOTE — Progress Notes (Signed)
[facility]  Patient ID: James Matthews,  MRN: [id number]  PCP: Shelva Majestic, MD  Date of Evaluation: 12/11/18 Time spent:15 minutes  Chief Complaint:  Chief Complaint    Insomnia      HISTORY/CURRENT STATUS: HPI wants to discuss changing the Viibryd.   Has had more social anxiety than before.  Doesn't want to go out and do anything right now, B/c anxiety and depression. Low energy and motivation. Afraid to go in to a store like [employer] b/c he's afraid he'll have another panic attack. "It goes around and around." Anxiety is worse at night. Trouble sleeping, is a lot better though the past few nights.  CBTI is helping a lot.  Sees [name] and Dr. Jason Fila at [place].  Denies SI/HI.  Would like to try a different antidep.  Feels that Viibryd isn't helping as much anymore.   Denies dizziness, syncope, seizures, numbness, tingling, tremor, tics, unsteady gait, slurred speech, confusion. Denies muscle or joint pain, stiffness, or dystonia.  Individual Medical History/ Review of Systems: Changes? :No    Past medications for mental health diagnoses include: Luvox, Xanax, Lexapro, Prozac, Klonopincaused him to be down,Cymbalta, Abilify, Sonata, Viibryd, Ativan, Ambien, Lunesta, trazodone, Remeron, Elavil, Restoril  Allergies: Lisinopril and Other  Current Medications:  Current Outpatient Medications:  .  ALPRAZolam (XANAX) 1 MG tablet, 1/2-1 po tid prn.  No more than 2.5 mg/day., Disp: 75 tablet, Rfl: 1 .  busPIRone (BUSPAR) 30 MG tablet, TAKE 1 TABLET BY MOUTH TWICE A DAY, Disp: 180 tablet, Rfl: 1 .  losartan (COZAAR) 100 MG tablet, Take 1 tablet by mouth every evening. , Disp: , Rfl:  .  Multiple Vitamins-Minerals (MULTIVITAMIN ADULT PO), Take 1 tablet by mouth daily., Disp: , Rfl:  .  desvenlafaxine (PRISTIQ) 50 MG 24 hr tablet, Take 1 tablet (50 mg total) by mouth daily., Disp: 30 tablet, Rfl: 1 .  zolpidem (AMBIEN) 10 MG tablet, Take 1 tablet (10 mg total) by  mouth at bedtime as needed for up to 30 days for sleep., Disp: 30 tablet, Rfl: 2 Medication Side Effects: none  Family Medical/ Social History: Changes? No  MENTAL HEALTH EXAM:  There were no vitals taken for this visit.There is no height or weight on file to calculate BMI.  General Appearance: Casual  Eye Contact:  Good  Speech:  Clear and Coherent clipped, (apraxia)  Volume:  Normal  Mood:  Euthymic  Affect:  Appropriate  Thought Process:  Goal Directed  Orientation:  Full (Time, Place, and Person)  Thought Content: Logical   Suicidal Thoughts:  No  Homicidal Thoughts:  No  Memory:  WNL  Judgement:  Good  Insight:  Good  Psychomotor Activity:  Normal  Concentration:  Concentration: Good  Recall:  Good  Fund of Knowledge: Good  Language: Good  Assets:  Desire for Improvement  ADL's:  Intact  Cognition: WNL  Prognosis:  Good    DIAGNOSES:    ICD-10-CM   1. Social anxiety disorder F40.10   2. Insomnia, unspecified type G47.00   3. Major depressive disorder, recurrent episode, moderate (HCC) F33.1     Receiving Psychotherapy: Yes at [facility] with Dr. Jason Fila and [name]  RECOMMENDATIONS:  Increase Xanax 1 mg to 1/2-1 p.o. every 8 hours as needed, but no more than 2.5 mg daily. Continue BuSpar 30 mg 1 twice daily. Start Pristiq 50 mg daily. Wean off Viibryd by taking 20 mg daily for 1 week.  Samples were provided. Continue Ambien  10 mg nightly as needed sleep. Continue psychotherapy with Dr. Jason FilaBray and [name]. Turn in 4 weeks.  Melony Overlyeresa Myking Sar, PA-C   This record has been created using [employer] software.  Chart creation errors have been sought, but may not always have been located and corrected. Such creation errors do not reflect on the standard of medical care.

## 2018-12-11 NOTE — Telephone Encounter (Signed)
Traci, can you do a PA on this?

## 2018-12-11 NOTE — Telephone Encounter (Signed)
Insurance won't cover Prestiq wants to know if he can take something else or if insurance will cover it in some other way.

## 2018-12-11 NOTE — Telephone Encounter (Signed)
PA was submitted through [facility]

## 2018-12-12 NOTE — Telephone Encounter (Signed)
Pharmacy filled an old rx and will now fill the correct 150 mg

## 2018-12-12 NOTE — Telephone Encounter (Signed)
Great.  Thanks

## 2018-12-12 NOTE — Telephone Encounter (Signed)
Thanks

## 2018-12-12 NOTE — Telephone Encounter (Signed)
Prior authorization approved for Pristiq 50 mg through [facility] effective 12/11/2018-12/09/2021

## 2018-12-15 ENCOUNTER — Telehealth: Payer: Self-pay | Admitting: Physician Assistant

## 2018-12-15 NOTE — Telephone Encounter (Signed)
Left detailed information, PA was approved and to contact pharmacy again to have them run it again.

## 2018-12-15 NOTE — Telephone Encounter (Signed)
James Matthews, I think you did a PA and it was approved.  Would you call [place] and let him know that please?  Thank you

## 2018-12-15 NOTE — Telephone Encounter (Signed)
Patient called Thursday  stated his insurance does not cover Prestiq and would like a call back regarding this.

## 2018-12-16 ENCOUNTER — Ambulatory Visit (INDEPENDENT_AMBULATORY_CARE_PROVIDER_SITE_OTHER): Payer: BC Managed Care – PPO | Admitting: Psychology

## 2018-12-16 DIAGNOSIS — F341 Dysthymic disorder: Secondary | ICD-10-CM | POA: Diagnosis not present

## 2018-12-16 DIAGNOSIS — F4010 Social phobia, unspecified: Secondary | ICD-10-CM | POA: Diagnosis not present

## 2018-12-16 DIAGNOSIS — F411 Generalized anxiety disorder: Secondary | ICD-10-CM | POA: Diagnosis not present

## 2018-12-19 ENCOUNTER — Ambulatory Visit (INDEPENDENT_AMBULATORY_CARE_PROVIDER_SITE_OTHER): Payer: BC Managed Care – PPO | Admitting: Psychology

## 2018-12-19 DIAGNOSIS — F4322 Adjustment disorder with anxiety: Secondary | ICD-10-CM

## 2018-12-23 ENCOUNTER — Ambulatory Visit (INDEPENDENT_AMBULATORY_CARE_PROVIDER_SITE_OTHER): Payer: BC Managed Care – PPO | Admitting: Psychology

## 2018-12-23 DIAGNOSIS — F341 Dysthymic disorder: Secondary | ICD-10-CM

## 2018-12-23 DIAGNOSIS — F064 Anxiety disorder due to known physiological condition: Secondary | ICD-10-CM

## 2018-12-30 ENCOUNTER — Ambulatory Visit (INDEPENDENT_AMBULATORY_CARE_PROVIDER_SITE_OTHER): Payer: BC Managed Care – PPO | Admitting: Psychology

## 2018-12-30 DIAGNOSIS — F4010 Social phobia, unspecified: Secondary | ICD-10-CM | POA: Diagnosis not present

## 2018-12-30 DIAGNOSIS — F411 Generalized anxiety disorder: Secondary | ICD-10-CM

## 2018-12-30 DIAGNOSIS — F341 Dysthymic disorder: Secondary | ICD-10-CM

## 2019-01-02 ENCOUNTER — Ambulatory Visit (INDEPENDENT_AMBULATORY_CARE_PROVIDER_SITE_OTHER): Payer: BC Managed Care – PPO | Admitting: Psychology

## 2019-01-02 DIAGNOSIS — F331 Major depressive disorder, recurrent, moderate: Secondary | ICD-10-CM | POA: Diagnosis not present

## 2019-01-02 DIAGNOSIS — F5101 Primary insomnia: Secondary | ICD-10-CM | POA: Diagnosis not present

## 2019-01-07 ENCOUNTER — Encounter: Payer: Self-pay | Admitting: Family Medicine

## 2019-01-07 ENCOUNTER — Ambulatory Visit (INDEPENDENT_AMBULATORY_CARE_PROVIDER_SITE_OTHER): Payer: BC Managed Care – PPO | Admitting: Family Medicine

## 2019-01-07 ENCOUNTER — Other Ambulatory Visit: Payer: Self-pay

## 2019-01-07 ENCOUNTER — Ambulatory Visit: Payer: BC Managed Care – PPO | Admitting: Physician Assistant

## 2019-01-07 ENCOUNTER — Telehealth: Payer: Self-pay | Admitting: Physician Assistant

## 2019-01-07 DIAGNOSIS — H9201 Otalgia, right ear: Secondary | ICD-10-CM | POA: Diagnosis not present

## 2019-01-07 MED ORDER — FLUTICASONE PROPIONATE 50 MCG/ACT NA SUSP: 2.0000 | Freq: Every day | NASAL | 0 refills | Status: DC

## 2019-01-07 NOTE — Telephone Encounter (Signed)
Pt already has 2 rx's on file at his pharmacy

## 2019-01-07 NOTE — Telephone Encounter (Signed)
Spoke with pharmacist and they will go ahead and fill rx for Xanax on file today

## 2019-01-07 NOTE — Progress Notes (Signed)
Subjective:    Patient ID: James Matthews, male    DOB: 02-Sep-1995, 23 y.o.   MRN: [id number]  HPI Virtual Visit via Video Note  I connected with the patient on 01/07/19 at  2:15 PM EDT by a video enabled telemedicine application and verified that I am speaking with the correct person using two identifiers.  Location patient: home Location provider:work or home office Persons participating in the virtual visit: patient, provider  I discussed the limitations of evaluation and management by telemedicine and the availability of in person appointments. The patient expressed understanding and agreed to proceed.   HPI: Here for 3 days of pain in the right ear. No pain on chewing. No fever or ST or cough or SOB. He has year round allergies and he takes Claritin daily.    ROS: See pertinent positives and negatives per HPI.  Past Medical History:  Diagnosis Date  . Hypertension   . IBS (irritable bowel syndrome)    probiotic  . Posterior urethral valves   . Premature birth    3 months early  . Renal disorder   . Verbal apraxia     Past Surgical History:  Procedure Laterality Date  . ESOPHAGEAL MANOMETRY N/A 01/23/2016   Procedure: ESOPHAGEAL MANOMETRY (EM);  Surgeon: Juanita Craver, MD;  Location: [facility];  Service: Endoscopy;  Laterality: N/A;  . LASIK     eye surgery july 2017  . NEPHRECTOMY     rght   . TONSILLECTOMY      Family History  Problem Relation Age of Onset  . Breast cancer Mother        survivor  . Hypertension Mother   . Hyperlipidemia Mother   . Heart disease Maternal Grandfather      Current Outpatient Medications:  .  ALPRAZolam (XANAX) 1 MG tablet, 1/2-1 po tid prn.  No more than 2.5 mg/day., Disp: 75 tablet, Rfl: 1 .  busPIRone (BUSPAR) 30 MG tablet, TAKE 1 TABLET BY MOUTH TWICE A DAY, Disp: 180 tablet, Rfl: 1 .  desvenlafaxine (PRISTIQ) 50 MG 24 hr tablet, Take 1 tablet (50 mg total) by mouth daily., Disp: 30 tablet, Rfl: 1 .   loratadine (CLARITIN) 10 MG tablet, Take 10 mg by mouth daily., Disp: , Rfl:  .  losartan (COZAAR) 100 MG tablet, Take 1 tablet by mouth every evening. , Disp: , Rfl:  .  fluticasone (FLONASE) 50 MCG/ACT nasal spray, Place 2 sprays into both nostrils daily., Disp: 16 g, Rfl: 0  EXAM:  VITALS per patient if applicable:  GENERAL: alert, oriented, appears well and in no acute distress  HEENT: atraumatic, conjunttiva clear, no obvious abnormalities on inspection of external nose and ears  NECK: normal movements of the head and neck  LUNGS: on inspection no signs of respiratory distress, breathing rate appears normal, no obvious gross SOB, gasping or wheezing  CV: no obvious cyanosis  MS: moves all visible extremities without noticeable abnormality  PSYCH/NEURO: pleasant and cooperative, no obvious depression or anxiety, speech and thought processing grossly intact  ASSESSMENT AND PLAN: Right otalgia, likely due to eustachian tube dysfunction. He will add Flonase daily to the Claritin. Recheck prn.  Alysia Penna, MD  Discussed the following assessment and plan:  No diagnosis found.     I discussed the assessment and treatment plan with the patient. The patient was provided an opportunity to ask questions and all were answered. The patient agreed with the plan and demonstrated an understanding of the instructions.  The patient was advised to call back or seek an in-person evaluation if the symptoms worsen or if the condition fails to improve as anticipated.     Review of Systems     Objective:   Physical Exam        Assessment & Plan:

## 2019-01-07 NOTE — Telephone Encounter (Signed)
Patient need refill on new dose of Xanax that Helene Kelp put him on  to be sent to [facility] on [address]. Appointment canceled for today provider out

## 2019-01-13 ENCOUNTER — Ambulatory Visit (INDEPENDENT_AMBULATORY_CARE_PROVIDER_SITE_OTHER): Payer: BC Managed Care – PPO | Admitting: Psychology

## 2019-01-13 DIAGNOSIS — F341 Dysthymic disorder: Secondary | ICD-10-CM

## 2019-01-13 DIAGNOSIS — F4010 Social phobia, unspecified: Secondary | ICD-10-CM | POA: Diagnosis not present

## 2019-01-13 DIAGNOSIS — F411 Generalized anxiety disorder: Secondary | ICD-10-CM | POA: Diagnosis not present

## 2019-01-15 DIAGNOSIS — Z09 Encounter for follow-up examination after completed treatment for conditions other than malignant neoplasm: Secondary | ICD-10-CM | POA: Diagnosis not present

## 2019-01-15 DIAGNOSIS — R112 Nausea with vomiting, unspecified: Secondary | ICD-10-CM | POA: Diagnosis not present

## 2019-01-15 DIAGNOSIS — I129 Hypertensive chronic kidney disease with stage 1 through stage 4 chronic kidney disease, or unspecified chronic kidney disease: Secondary | ICD-10-CM | POA: Diagnosis not present

## 2019-01-15 DIAGNOSIS — Z8719 Personal history of other diseases of the digestive system: Secondary | ICD-10-CM | POA: Diagnosis not present

## 2019-01-15 DIAGNOSIS — E785 Hyperlipidemia, unspecified: Secondary | ICD-10-CM | POA: Diagnosis not present

## 2019-01-15 DIAGNOSIS — R3989 Other symptoms and signs involving the genitourinary system: Secondary | ICD-10-CM | POA: Diagnosis not present

## 2019-01-15 DIAGNOSIS — K219 Gastro-esophageal reflux disease without esophagitis: Secondary | ICD-10-CM | POA: Diagnosis not present

## 2019-01-15 DIAGNOSIS — R809 Proteinuria, unspecified: Secondary | ICD-10-CM | POA: Diagnosis not present

## 2019-01-15 DIAGNOSIS — N181 Chronic kidney disease, stage 1: Secondary | ICD-10-CM | POA: Diagnosis not present

## 2019-01-15 DIAGNOSIS — R109 Unspecified abdominal pain: Secondary | ICD-10-CM | POA: Diagnosis not present

## 2019-01-15 DIAGNOSIS — E790 Hyperuricemia without signs of inflammatory arthritis and tophaceous disease: Secondary | ICD-10-CM | POA: Diagnosis not present

## 2019-01-16 ENCOUNTER — Ambulatory Visit (INDEPENDENT_AMBULATORY_CARE_PROVIDER_SITE_OTHER): Payer: BC Managed Care – PPO | Admitting: Psychology

## 2019-01-16 DIAGNOSIS — F4322 Adjustment disorder with anxiety: Secondary | ICD-10-CM

## 2019-01-20 ENCOUNTER — Ambulatory Visit (INDEPENDENT_AMBULATORY_CARE_PROVIDER_SITE_OTHER): Payer: BC Managed Care – PPO | Admitting: Psychology

## 2019-01-20 DIAGNOSIS — F411 Generalized anxiety disorder: Secondary | ICD-10-CM | POA: Diagnosis not present

## 2019-01-20 DIAGNOSIS — F4010 Social phobia, unspecified: Secondary | ICD-10-CM

## 2019-01-20 DIAGNOSIS — F341 Dysthymic disorder: Secondary | ICD-10-CM | POA: Diagnosis not present

## 2019-01-27 ENCOUNTER — Ambulatory Visit (INDEPENDENT_AMBULATORY_CARE_PROVIDER_SITE_OTHER): Payer: BC Managed Care – PPO | Admitting: Psychology

## 2019-01-27 DIAGNOSIS — F4010 Social phobia, unspecified: Secondary | ICD-10-CM

## 2019-01-27 DIAGNOSIS — F341 Dysthymic disorder: Secondary | ICD-10-CM

## 2019-01-27 DIAGNOSIS — F411 Generalized anxiety disorder: Secondary | ICD-10-CM | POA: Diagnosis not present

## 2019-01-30 ENCOUNTER — Ambulatory Visit: Payer: BC Managed Care – PPO | Admitting: Psychology

## 2019-01-30 ENCOUNTER — Other Ambulatory Visit: Payer: Self-pay | Admitting: Physician Assistant

## 2019-02-03 ENCOUNTER — Ambulatory Visit (INDEPENDENT_AMBULATORY_CARE_PROVIDER_SITE_OTHER): Payer: BC Managed Care – PPO | Admitting: Psychology

## 2019-02-03 DIAGNOSIS — F4010 Social phobia, unspecified: Secondary | ICD-10-CM

## 2019-02-03 DIAGNOSIS — F411 Generalized anxiety disorder: Secondary | ICD-10-CM

## 2019-02-03 DIAGNOSIS — F341 Dysthymic disorder: Secondary | ICD-10-CM | POA: Diagnosis not present

## 2019-02-05 ENCOUNTER — Other Ambulatory Visit: Payer: Self-pay

## 2019-02-05 MED ORDER — PRISTIQ 50 MG PO TB24: 50.0000 mg | ORAL_TABLET | Freq: Every day | ORAL | 1 refills | Status: DC

## 2019-02-10 ENCOUNTER — Other Ambulatory Visit: Payer: Self-pay

## 2019-02-10 ENCOUNTER — Ambulatory Visit (INDEPENDENT_AMBULATORY_CARE_PROVIDER_SITE_OTHER): Payer: BC Managed Care – PPO | Admitting: Psychology

## 2019-02-10 ENCOUNTER — Ambulatory Visit: Payer: BC Managed Care – PPO | Admitting: Physician Assistant

## 2019-02-10 ENCOUNTER — Telehealth: Payer: Self-pay | Admitting: Physician Assistant

## 2019-02-10 DIAGNOSIS — F341 Dysthymic disorder: Secondary | ICD-10-CM | POA: Diagnosis not present

## 2019-02-10 DIAGNOSIS — F4010 Social phobia, unspecified: Secondary | ICD-10-CM

## 2019-02-10 DIAGNOSIS — F411 Generalized anxiety disorder: Secondary | ICD-10-CM

## 2019-02-10 MED ORDER — PRISTIQ 100 MG PO TB24: 100.0000 mg | ORAL_TABLET | Freq: Every day | ORAL | 1 refills | Status: DC

## 2019-02-10 NOTE — Telephone Encounter (Signed)
James Matthews called and said the pharmacy has alerted to him a refill of his pristiq that is ready but it is the 50mg .  I told him not to pick that one up, that a new one for the 100mg  will be sent.  I hope that is correct and that the new prescription will be sent.

## 2019-02-10 NOTE — Telephone Encounter (Signed)
Updated RX to 100 mg Pristiq Brand name, Prior Authorization approved for 50 mg. May need to submit PA for 100 mg if needed.

## 2019-02-10 NOTE — Telephone Encounter (Signed)
Pt advised taking Pristiq for about 8 weeks and can tell his moods are better. Still having anxiety and panic attacks. Would like to know if he should increase Pristiq. Taking 50 mg now. [phone]

## 2019-02-10 NOTE — Telephone Encounter (Signed)
Pt. Made aware and verbalized understanding and agrees to increase to 100 Mg.

## 2019-02-10 NOTE — Telephone Encounter (Signed)
Patient has been on Pristiq 50 mg for at least 6 weeks if not 7.  Depression is somewhat better but still having panic attacks.  Per patient request okay to increase Pristiq to 100 mg 1 daily #30 no refills.  For the benefit of the provider if he does not respond adequately with regard to anxiety adding low-dose sertraline.  Pristiq has a fairly strong norepinephrine effect and fairly weak serotonin effect.  Sometimes Pristiq is therefore good for depression but not always good for anxiety.  But it is certainly reasonable to try it for several weeks at 100 mg a day.  He may respond.

## 2019-02-11 ENCOUNTER — Other Ambulatory Visit: Payer: Self-pay

## 2019-02-11 MED ORDER — DESVENLAFAXINE SUCCINATE ER 100 MG PO TB24: 100.0000 mg | ORAL_TABLET | Freq: Every day | ORAL | 1 refills | Status: DC

## 2019-02-11 NOTE — Telephone Encounter (Signed)
noted 

## 2019-02-13 ENCOUNTER — Ambulatory Visit (INDEPENDENT_AMBULATORY_CARE_PROVIDER_SITE_OTHER): Payer: BC Managed Care – PPO | Admitting: Psychology

## 2019-02-13 DIAGNOSIS — F5101 Primary insomnia: Secondary | ICD-10-CM | POA: Diagnosis not present

## 2019-02-13 DIAGNOSIS — F331 Major depressive disorder, recurrent, moderate: Secondary | ICD-10-CM | POA: Diagnosis not present

## 2019-02-17 ENCOUNTER — Ambulatory Visit (INDEPENDENT_AMBULATORY_CARE_PROVIDER_SITE_OTHER): Payer: BC Managed Care – PPO | Admitting: Psychology

## 2019-02-17 DIAGNOSIS — F411 Generalized anxiety disorder: Secondary | ICD-10-CM | POA: Diagnosis not present

## 2019-02-17 DIAGNOSIS — F4010 Social phobia, unspecified: Secondary | ICD-10-CM

## 2019-02-17 DIAGNOSIS — F341 Dysthymic disorder: Secondary | ICD-10-CM | POA: Diagnosis not present

## 2019-02-24 ENCOUNTER — Ambulatory Visit (INDEPENDENT_AMBULATORY_CARE_PROVIDER_SITE_OTHER): Payer: BC Managed Care – PPO | Admitting: Psychology

## 2019-02-24 DIAGNOSIS — F411 Generalized anxiety disorder: Secondary | ICD-10-CM | POA: Diagnosis not present

## 2019-02-24 DIAGNOSIS — F4010 Social phobia, unspecified: Secondary | ICD-10-CM

## 2019-02-24 DIAGNOSIS — F341 Dysthymic disorder: Secondary | ICD-10-CM

## 2019-02-27 ENCOUNTER — Ambulatory Visit (INDEPENDENT_AMBULATORY_CARE_PROVIDER_SITE_OTHER): Payer: BC Managed Care – PPO | Admitting: Physician Assistant

## 2019-02-27 ENCOUNTER — Encounter: Payer: Self-pay | Admitting: Physician Assistant

## 2019-02-27 ENCOUNTER — Other Ambulatory Visit: Payer: Self-pay

## 2019-02-27 DIAGNOSIS — G4700 Insomnia, unspecified: Secondary | ICD-10-CM | POA: Diagnosis not present

## 2019-02-27 DIAGNOSIS — F331 Major depressive disorder, recurrent, moderate: Secondary | ICD-10-CM

## 2019-02-27 DIAGNOSIS — F4010 Social phobia, unspecified: Secondary | ICD-10-CM | POA: Diagnosis not present

## 2019-02-27 MED ORDER — ALPRAZOLAM 1 MG PO TABS
ORAL_TABLET | ORAL | 2 refills | Status: DC
Start: 2019-02-27 — End: 2019-04-15

## 2019-02-27 MED ORDER — DESVENLAFAXINE SUCCINATE ER 100 MG PO TB24: 100.0000 mg | ORAL_TABLET | Freq: Every day | ORAL | 0 refills | Status: DC

## 2019-02-27 MED ORDER — ZOLPIDEM TARTRATE 10 MG PO TABS
ORAL_TABLET | ORAL | 2 refills | Status: DC
Start: 2019-02-27 — End: 2019-04-16

## 2019-02-27 NOTE — Progress Notes (Signed)
[facility]  Patient ID: James Matthews,  MRN: [id number]  PCP: [name], MD  Date of Evaluation: 02/27/2019 Time spent:15 minutes  Chief Complaint:  Chief Complaint    Insomnia; Anxiety; Depression; Follow-up     Virtual Visit via Telephone Note  I connected with patient by a video enabled telemedicine application or telephone, with their informed consent, and verified patient privacy and that I am speaking with the correct person using two identifiers.  I am private, in my home and the patient is home.   I discussed the limitations, risks, security and privacy concerns of performing an evaluation and management service by telephone and the availability of in person appointments. I also discussed with the patient that there may be a patient responsible charge related to this service. The patient expressed understanding and agreed to proceed.   I discussed the assessment and treatment plan with the patient. The patient was provided an opportunity to ask questions and all were answered. The patient agreed with the plan and demonstrated an understanding of the instructions.   The patient was advised to call back or seek an in-person evaluation if the symptoms worsen or if the condition fails to improve as anticipated.  I provided 15 minutes of non-face-to-face time during this encounter.  HISTORY/CURRENT STATUS: HPI For routine med check.   Doing quite a bit better!  Anxiety and depression are better!  And he's sleeping better. He's happy that his mood is more up and his mind will slow down sometimes.  He's able to fall asleep and usually stay asleep all night.  If he does wake up, it's usually only once, as opposed to 3 times per night.  And he's able to go back to sleep with an hour at the most.  Patient denies loss of interest in usual activities and is able to enjoy things.  Denies decreased energy or motivation.  Appetite has not changed.  No extreme  sadness, tearfulness, or feelings of hopelessness.  Denies any changes in concentration, making decisions or remembering things.  Denies suicidal or homicidal thoughts.  Denies dizziness, syncope, seizures, numbness, tingling, tremor, tics, unsteady gait, slurred speech, confusion. Denies muscle or joint pain, stiffness, or dystonia.  Individual Medical History/ Review of Systems: Changes? :No    Past medications for mental health diagnoses include: Luvox, Xanax, Lexapro, Prozac, Klonopincaused him to be down,Cymbalta, Abilify, Sonata, Viibryd, Ativan, Ambien, Lunesta, trazodone, Remeron, Elavil, Restoril  Allergies: Lisinopril and Other  Current Medications:  Current Outpatient Medications:  .  ALPRAZolam (XANAX) 1 MG tablet, 1/2-1 po tid prn.  No more than 2.5 mg/day., Disp: 75 tablet, Rfl: 1 .  busPIRone (BUSPAR) 30 MG tablet, TAKE 1 TABLET BY MOUTH TWICE A DAY, Disp: 180 tablet, Rfl: 1 .  desvenlafaxine (PRISTIQ) 100 MG 24 hr tablet, Take 1 tablet (100 mg total) by mouth daily., Disp: 30 tablet, Rfl: 1 .  losartan (COZAAR) 100 MG tablet, Take 1 tablet by mouth every evening. , Disp: , Rfl:  .  zolpidem (AMBIEN) 10 MG tablet, TAKE 1 TABLET BY MOUTH AT BEDTIME AS NEEDED FOR UP TO 30 DAYS FOR SLEEP., Disp: , Rfl:  .  fluticasone (FLONASE) 50 MCG/ACT nasal spray, Place 2 sprays into both nostrils daily. (Patient not taking: Reported on 02/27/2019), Disp: 16 g, Rfl: 0 .  loratadine (CLARITIN) 10 MG tablet, Take 10 mg by mouth daily., Disp: , Rfl:  Medication Side Effects: none  Family Medical/ Social History: Changes? No  MENTAL  HEALTH EXAM:  There were no vitals taken for this visit.There is no height or weight on file to calculate BMI.  General Appearance: unable to assess  Eye Contact:  unable to assess  Speech:  Clear and Coherent clipped  Volume:  Normal  Mood:  Euthymic  Affect:  unable to assess  Thought Process:  Goal Directed  Orientation:  Full (Time, Place, and Person)   Thought Content: Logical   Suicidal Thoughts:  No  Homicidal Thoughts:  No  Memory:  WNL  Judgement:  Good  Insight:  Good  Psychomotor Activity:  unable to assess  Concentration:  Concentration: Good  Recall:  Good  Fund of Knowledge: Good  Language: Good  Assets:  Desire for Improvement  ADL's:  Intact  Cognition: WNL  Prognosis:  Good    DIAGNOSES:    ICD-10-CM   1. Insomnia, unspecified type  G47.00   2. Major depressive disorder, recurrent episode, moderate (HCC)  F33.1   3. Social anxiety disorder  F40.10     Receiving Psychotherapy: Yes  Dr. Jason FilaBray   RECOMMENDATIONS:  I'm glad to see him doing so well! PDMP was reviewed. Cont Xanax 1 mg po bid prn w/ occasional 1/2 mid-day prn. Cont Buspar 30 mg bid. Cont Pristiq 100mg  qd Cont Ambien 10 mg qhs prn.  Cont therapy w/ Dr. Jason FilaBray. Return in 2 months.  Melony Overlyeresa Oley Lahaie, PA-C   This record has been created using [employer] software.  Chart creation errors have been sought, but may not always have been located and corrected. Such creation errors do not reflect on the standard of medical care.

## 2019-03-03 ENCOUNTER — Ambulatory Visit (INDEPENDENT_AMBULATORY_CARE_PROVIDER_SITE_OTHER): Payer: BC Managed Care – PPO | Admitting: Psychology

## 2019-03-03 DIAGNOSIS — F341 Dysthymic disorder: Secondary | ICD-10-CM

## 2019-03-03 DIAGNOSIS — F4010 Social phobia, unspecified: Secondary | ICD-10-CM | POA: Diagnosis not present

## 2019-03-03 DIAGNOSIS — F411 Generalized anxiety disorder: Secondary | ICD-10-CM | POA: Diagnosis not present

## 2019-03-10 ENCOUNTER — Ambulatory Visit (INDEPENDENT_AMBULATORY_CARE_PROVIDER_SITE_OTHER): Payer: BC Managed Care – PPO | Admitting: Psychology

## 2019-03-10 DIAGNOSIS — F341 Dysthymic disorder: Secondary | ICD-10-CM | POA: Diagnosis not present

## 2019-03-10 DIAGNOSIS — F4010 Social phobia, unspecified: Secondary | ICD-10-CM | POA: Diagnosis not present

## 2019-03-10 DIAGNOSIS — F411 Generalized anxiety disorder: Secondary | ICD-10-CM

## 2019-03-12 ENCOUNTER — Encounter: Payer: Self-pay | Admitting: Family Medicine

## 2019-03-12 ENCOUNTER — Other Ambulatory Visit: Payer: Self-pay

## 2019-03-12 ENCOUNTER — Ambulatory Visit (INDEPENDENT_AMBULATORY_CARE_PROVIDER_SITE_OTHER): Payer: BC Managed Care – PPO

## 2019-03-12 DIAGNOSIS — Z23 Encounter for immunization: Secondary | ICD-10-CM | POA: Diagnosis not present

## 2019-03-17 ENCOUNTER — Ambulatory Visit (INDEPENDENT_AMBULATORY_CARE_PROVIDER_SITE_OTHER): Payer: BC Managed Care – PPO | Admitting: Psychology

## 2019-03-17 DIAGNOSIS — F411 Generalized anxiety disorder: Secondary | ICD-10-CM

## 2019-03-17 DIAGNOSIS — F341 Dysthymic disorder: Secondary | ICD-10-CM

## 2019-03-17 DIAGNOSIS — F4010 Social phobia, unspecified: Secondary | ICD-10-CM | POA: Diagnosis not present

## 2019-03-19 ENCOUNTER — Ambulatory Visit (INDEPENDENT_AMBULATORY_CARE_PROVIDER_SITE_OTHER): Payer: BC Managed Care – PPO | Admitting: Psychology

## 2019-03-19 DIAGNOSIS — F5101 Primary insomnia: Secondary | ICD-10-CM

## 2019-03-19 DIAGNOSIS — F331 Major depressive disorder, recurrent, moderate: Secondary | ICD-10-CM | POA: Diagnosis not present

## 2019-03-20 ENCOUNTER — Ambulatory Visit: Payer: BC Managed Care – PPO | Admitting: Physician Assistant

## 2019-03-26 ENCOUNTER — Ambulatory Visit (INDEPENDENT_AMBULATORY_CARE_PROVIDER_SITE_OTHER): Payer: BC Managed Care – PPO | Admitting: Psychology

## 2019-03-26 DIAGNOSIS — F4010 Social phobia, unspecified: Secondary | ICD-10-CM

## 2019-03-26 DIAGNOSIS — F341 Dysthymic disorder: Secondary | ICD-10-CM

## 2019-03-26 DIAGNOSIS — F411 Generalized anxiety disorder: Secondary | ICD-10-CM

## 2019-03-31 ENCOUNTER — Ambulatory Visit (INDEPENDENT_AMBULATORY_CARE_PROVIDER_SITE_OTHER): Payer: BC Managed Care – PPO | Admitting: Psychology

## 2019-03-31 DIAGNOSIS — F411 Generalized anxiety disorder: Secondary | ICD-10-CM | POA: Diagnosis not present

## 2019-03-31 DIAGNOSIS — F341 Dysthymic disorder: Secondary | ICD-10-CM

## 2019-03-31 DIAGNOSIS — F4010 Social phobia, unspecified: Secondary | ICD-10-CM | POA: Diagnosis not present

## 2019-04-01 ENCOUNTER — Telehealth: Payer: Self-pay | Admitting: Physician Assistant

## 2019-04-01 NOTE — Telephone Encounter (Signed)
Pt left v-mail stating sleep meds not working ask nurse to return his call [phone]

## 2019-04-02 ENCOUNTER — Ambulatory Visit (INDEPENDENT_AMBULATORY_CARE_PROVIDER_SITE_OTHER): Payer: BC Managed Care – PPO | Admitting: Psychology

## 2019-04-02 ENCOUNTER — Other Ambulatory Visit: Payer: Self-pay | Admitting: Physician Assistant

## 2019-04-02 DIAGNOSIS — F5101 Primary insomnia: Secondary | ICD-10-CM

## 2019-04-02 DIAGNOSIS — F331 Major depressive disorder, recurrent, moderate: Secondary | ICD-10-CM | POA: Diagnosis not present

## 2019-04-02 MED ORDER — GABAPENTIN 600 MG PO TABS: ORAL_TABLET | ORAL | 1 refills | Status: DC

## 2019-04-02 NOTE — Telephone Encounter (Signed)
Patient notified and given instructions. He's not taken before. Instructed to call back if symptoms aren't resolved.

## 2019-04-02 NOTE — Telephone Encounter (Signed)
Has he ever taken Gabapentin?  I don't see it in chart.  If he has RLS, which it sounds like, this will help, also help sleep and anxiety.  I'll going to send it to his pharm.

## 2019-04-03 ENCOUNTER — Encounter: Payer: Self-pay | Admitting: Family Medicine

## 2019-04-03 ENCOUNTER — Other Ambulatory Visit: Payer: Self-pay | Admitting: Physician Assistant

## 2019-04-03 ENCOUNTER — Ambulatory Visit (INDEPENDENT_AMBULATORY_CARE_PROVIDER_SITE_OTHER): Payer: BC Managed Care – PPO | Admitting: Family Medicine

## 2019-04-03 ENCOUNTER — Ambulatory Visit: Payer: Self-pay

## 2019-04-03 DIAGNOSIS — Z20828 Contact with and (suspected) exposure to other viral communicable diseases: Secondary | ICD-10-CM | POA: Diagnosis not present

## 2019-04-03 DIAGNOSIS — J029 Acute pharyngitis, unspecified: Secondary | ICD-10-CM | POA: Diagnosis not present

## 2019-04-03 DIAGNOSIS — Z7189 Other specified counseling: Secondary | ICD-10-CM | POA: Diagnosis not present

## 2019-04-03 MED ORDER — AZELASTINE HCL 0.1 % NA SOLN: 2.0000 | Freq: Two times a day (BID) | NASAL | 12 refills | Status: DC

## 2019-04-03 MED ORDER — AZITHROMYCIN 250 MG PO TABS: ORAL_TABLET | ORAL | 0 refills | Status: DC

## 2019-04-03 NOTE — Progress Notes (Signed)
    Chief Complaint:  James Matthews is a 23 y.o. male who presents today for a virtual office visit with a chief complaint of sore throat.   Assessment/Plan:  Sore Throat No red flags.  Likely mild URI.  He will be getting COVID tested later today.  Start Astelin nasal spray.  Send in "pop prescription" for azithromycin and strict instruction not started with symptoms worsen or do not improve in the next several days.  Encouraged good oral hydration. Discussed reasons to return tocare. Follow up as needed.     Subjective:  HPI:  Sore Throat  Symptoms started yesterday.  Worsened over that time.  Has had some associated swelling in his right lower neck.  Has not tried any treatments.  No obvious aggravating or alleviating factors.  No other symptoms.  No fevers or chills.  No rhinorrhea or cough.  No nausea or vomiting.  No known sick contacts.  He will be getting tested for It later today at his school.  ROS: Per HPI  PMH: He reports that he has never smoked. He has never used smokeless tobacco. He reports that he does not drink alcohol or use drugs.      Objective/Observations  Physical Exam: Gen: NAD, resting comfortably HEENT: Oropharynx slightly erythematous with no exudate. Pulm: Normal work of breathing Neuro: Grossly normal, moves all extremities Psych: Normal affect and thought content  Virtual Visit via Video   I connected with James Christian Coulston on 04/03/19 at  4:00 PM EDT by a video enabled telemedicine application and verified that I am speaking with the correct person using two identifiers. I discussed the limitations of evaluation and management by telemedicine and the availability of in person appointments. The patient expressed understanding and agreed to proceed.   Patient location: Home Provider location: [place] participating in the virtual visit: Myself and Patient     Algis Greenhouse. Jerline Pain, MD 04/03/2019 10:41 AM

## 2019-04-03 NOTE — Telephone Encounter (Signed)
Pt. States he noticed yesterday having pain and swelling to the right side of his neck. No difficulty swallowing, no fever. No known injury. No other symptoms. Warm transfer to [facility] in the practice.  Answer Assessment - Initial Assessment Questions 1. ONSET: "When did the pain begin?"      Started yesterday 2. LOCATION: "Where does it hurt?"      Right side 3. PATTERN "Does the pain come and go, or has it been constant since it started?"      Constant 4. SEVERITY: "How bad is the pain?"  (Scale 1-10; or mild, moderate, severe)   - MILD (1-3): doesn't interfere with normal activities    - MODERATE (4-7): interferes with normal activities or awakens from sleep    - SEVERE (8-10):  excruciating pain, unable to do any normal activities      5 5. RADIATION: "Does the pain go anywhere else, shoot into your arms?"     No 6. CORD SYMPTOMS: "Any weakness or numbness of the arms or legs?"     No 7. CAUSE: "What do you think is causing the neck pain?"     Unsure 8. NECK OVERUSE: "Any recent activities that involved turning or twisting the neck?"     No 9. OTHER SYMPTOMS: "Do you have any other symptoms?" (e.g., headache, fever, chest pain, difficulty breathing, neck swelling)     Swelling 10. PREGNANCY: "Is there any chance you are pregnant?" "When was your last menstrual period?"       n/a  Protocols used: NECK PAIN OR STIFFNESS-A-AH

## 2019-04-03 NOTE — Telephone Encounter (Signed)
appt 05/04/2019 

## 2019-04-06 ENCOUNTER — Ambulatory Visit: Payer: BC Managed Care – PPO | Admitting: Psychology

## 2019-04-13 ENCOUNTER — Telehealth: Payer: Self-pay | Admitting: Physician Assistant

## 2019-04-13 NOTE — Telephone Encounter (Signed)
Pt is asking if there is anything else he can do for anxiety. He is taking Xanax 2  0.5mg  per day and doesn't feel is helping as much now. The meds wears off quickly. Some panic attacks.

## 2019-04-14 ENCOUNTER — Other Ambulatory Visit: Payer: Self-pay

## 2019-04-14 ENCOUNTER — Ambulatory Visit (INDEPENDENT_AMBULATORY_CARE_PROVIDER_SITE_OTHER): Payer: Self-pay | Admitting: Psychology

## 2019-04-14 DIAGNOSIS — F331 Major depressive disorder, recurrent, moderate: Secondary | ICD-10-CM

## 2019-04-14 DIAGNOSIS — F5101 Primary insomnia: Secondary | ICD-10-CM

## 2019-04-14 MED ORDER — DESVENLAFAXINE SUCCINATE ER 50 MG PO TB24: 50.0000 mg | ORAL_TABLET | Freq: Every day | ORAL | 1 refills | Status: DC

## 2019-04-14 NOTE — Telephone Encounter (Signed)
Increase the Xanax.  He is taking 1 mg, twice daily with an occasional half midday.  Lets go up to 1 mg p.o. 3 times daily.  I can send in a new prescription if he needs it.I would also like to increase the Pristiq to a total of 150 mg.  I will send in a prescription for 50 mg to add to the 100 that he has.I will need his pharmacy please.Thank you.

## 2019-04-14 NOTE — Telephone Encounter (Signed)
Rx for Pristiq 50 mg submitted to pharmacy, will just need the updated Xanax submitted

## 2019-04-15 ENCOUNTER — Other Ambulatory Visit: Payer: Self-pay | Admitting: Physician Assistant

## 2019-04-15 MED ORDER — ALPRAZOLAM 1 MG PO TABS: 1.0000 mg | ORAL_TABLET | Freq: Three times a day (TID) | ORAL | 0 refills | Status: DC | PRN

## 2019-04-15 NOTE — Telephone Encounter (Signed)
This should already be on file but they keep sending it.

## 2019-04-15 NOTE — Telephone Encounter (Signed)
Xanax was sent in.

## 2019-04-21 ENCOUNTER — Ambulatory Visit (INDEPENDENT_AMBULATORY_CARE_PROVIDER_SITE_OTHER): Payer: Self-pay | Admitting: Psychology

## 2019-04-21 DIAGNOSIS — F331 Major depressive disorder, recurrent, moderate: Secondary | ICD-10-CM

## 2019-04-21 DIAGNOSIS — F5101 Primary insomnia: Secondary | ICD-10-CM

## 2019-04-28 ENCOUNTER — Ambulatory Visit (INDEPENDENT_AMBULATORY_CARE_PROVIDER_SITE_OTHER): Payer: BC Managed Care – PPO | Admitting: Psychology

## 2019-04-28 DIAGNOSIS — F331 Major depressive disorder, recurrent, moderate: Secondary | ICD-10-CM

## 2019-04-28 DIAGNOSIS — F5101 Primary insomnia: Secondary | ICD-10-CM

## 2019-05-04 ENCOUNTER — Encounter: Payer: Self-pay | Admitting: Physician Assistant

## 2019-05-04 ENCOUNTER — Ambulatory Visit (INDEPENDENT_AMBULATORY_CARE_PROVIDER_SITE_OTHER): Payer: BC Managed Care – PPO | Admitting: Physician Assistant

## 2019-05-04 ENCOUNTER — Other Ambulatory Visit: Payer: Self-pay

## 2019-05-04 DIAGNOSIS — F3181 Bipolar II disorder: Secondary | ICD-10-CM

## 2019-05-04 DIAGNOSIS — G4700 Insomnia, unspecified: Secondary | ICD-10-CM

## 2019-05-04 DIAGNOSIS — F4010 Social phobia, unspecified: Secondary | ICD-10-CM | POA: Diagnosis not present

## 2019-05-04 MED ORDER — QUETIAPINE FUMARATE 100 MG PO TABS: ORAL_TABLET | ORAL | 1 refills | Status: DC

## 2019-05-04 MED ORDER — CLONAZEPAM 1 MG PO TABS: 1.0000 mg | ORAL_TABLET | Freq: Three times a day (TID) | ORAL | 1 refills | Status: DC | PRN
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
## 2019-05-05 ENCOUNTER — Ambulatory Visit (INDEPENDENT_AMBULATORY_CARE_PROVIDER_SITE_OTHER): Payer: BC Managed Care – PPO | Admitting: Psychology

## 2019-05-05 DIAGNOSIS — F5101 Primary insomnia: Secondary | ICD-10-CM

## 2019-05-05 DIAGNOSIS — F331 Major depressive disorder, recurrent, moderate: Secondary | ICD-10-CM | POA: Diagnosis not present

## 2019-05-06 ENCOUNTER — Other Ambulatory Visit: Payer: Self-pay | Admitting: Physician Assistant

## 2019-05-11 ENCOUNTER — Telehealth: Payer: Self-pay | Admitting: Physician Assistant

## 2019-05-11 NOTE — Telephone Encounter (Signed)
Left voicemail to call back with information 

## 2019-05-11 NOTE — Telephone Encounter (Signed)
Please triage

## 2019-05-11 NOTE — Telephone Encounter (Signed)
Pt left message asking you to return his call @ [phone]. Stated needs to hear from you only today.

## 2019-05-12 ENCOUNTER — Ambulatory Visit (INDEPENDENT_AMBULATORY_CARE_PROVIDER_SITE_OTHER): Payer: BC Managed Care – PPO | Admitting: Psychology

## 2019-05-12 ENCOUNTER — Telehealth: Payer: Self-pay | Admitting: Physician Assistant

## 2019-05-12 DIAGNOSIS — F330 Major depressive disorder, recurrent, mild: Secondary | ICD-10-CM | POA: Diagnosis not present

## 2019-05-12 DIAGNOSIS — F5101 Primary insomnia: Secondary | ICD-10-CM | POA: Diagnosis not present

## 2019-05-12 NOTE — Telephone Encounter (Signed)
I returned patient's call.  Pt is upset that I dx w/ him w/ Bipolar II.  "I've talked to my therapist that I'm seeing now, Dr. Glennon Hamilton that I've seen for several years, and my family and my close friends and they disagree w/ your dx. I can't trust what you say."  Pt was told I was going by the information I was given at the time.  He states that I 'threw him for a loop' and it has caused him a lot of distress b/c I dx him incorrectly. He is not going to take the Seroquel I gave him and he does not want to see me ever again.  "By the end of the week, your name better be off MyChart." I told him I don't know how to make that happen and he said "I'll find out."  I asked him to call our Glass blower/designer, Larina Earthly, during business hours tomorrow and she will help him.  He demands that the dx of Bip d/o be removed from his chart. It is in my last visit note and cannot be changed, nor would I if it was possible.

## 2019-05-12 NOTE — Telephone Encounter (Signed)
I spoke w/ him, See phone note.

## 2019-05-19 ENCOUNTER — Ambulatory Visit (INDEPENDENT_AMBULATORY_CARE_PROVIDER_SITE_OTHER): Payer: BC Managed Care – PPO | Admitting: Psychology

## 2019-05-19 ENCOUNTER — Ambulatory Visit: Payer: BC Managed Care – PPO | Admitting: Psychology

## 2019-05-19 DIAGNOSIS — F5101 Primary insomnia: Secondary | ICD-10-CM | POA: Diagnosis not present

## 2019-05-19 DIAGNOSIS — F331 Major depressive disorder, recurrent, moderate: Secondary | ICD-10-CM

## 2019-05-25 ENCOUNTER — Encounter: Payer: Self-pay | Admitting: Family Medicine

## 2019-05-25 ENCOUNTER — Ambulatory Visit (INDEPENDENT_AMBULATORY_CARE_PROVIDER_SITE_OTHER): Payer: BC Managed Care – PPO | Admitting: Family Medicine

## 2019-05-25 ENCOUNTER — Other Ambulatory Visit: Payer: Self-pay

## 2019-05-25 VITALS — BP 124/62 | HR 110 | Temp 99.0°F | Ht 73.0 in | Wt 174.2 lb

## 2019-05-25 DIAGNOSIS — F331 Major depressive disorder, recurrent, moderate: Secondary | ICD-10-CM | POA: Diagnosis not present

## 2019-05-25 DIAGNOSIS — Z0000 Encounter for general adult medical examination without abnormal findings: Secondary | ICD-10-CM

## 2019-05-25 DIAGNOSIS — Z8489 Family history of other specified conditions: Secondary | ICD-10-CM

## 2019-05-25 DIAGNOSIS — F411 Generalized anxiety disorder: Secondary | ICD-10-CM | POA: Diagnosis not present

## 2019-05-25 DIAGNOSIS — Z0001 Encounter for general adult medical examination with abnormal findings: Secondary | ICD-10-CM | POA: Diagnosis not present

## 2019-05-25 DIAGNOSIS — J4550 Severe persistent asthma, uncomplicated: Secondary | ICD-10-CM | POA: Insufficient documentation

## 2019-05-25 DIAGNOSIS — E559 Vitamin D deficiency, unspecified: Secondary | ICD-10-CM

## 2019-05-25 DIAGNOSIS — I10 Essential (primary) hypertension: Secondary | ICD-10-CM

## 2019-05-25 DIAGNOSIS — R519 Headache, unspecified: Secondary | ICD-10-CM | POA: Diagnosis not present

## 2019-05-25 DIAGNOSIS — N182 Chronic kidney disease, stage 2 (mild): Secondary | ICD-10-CM

## 2019-05-25 DIAGNOSIS — Q642 Congenital posterior urethral valves: Secondary | ICD-10-CM

## 2019-05-25 DIAGNOSIS — J4540 Moderate persistent asthma, uncomplicated: Secondary | ICD-10-CM

## 2019-05-25 LAB — POC URINALSYSI DIPSTICK (AUTOMATED)
Bilirubin, UA: NEGATIVE
Blood, UA: NEGATIVE
Glucose, UA: NEGATIVE
Ketones, UA: NEGATIVE
Leukocytes, UA: NEGATIVE
Nitrite, UA: NEGATIVE
Protein, UA: NEGATIVE
Spec Grav, UA: 1.01 (ref 1.010–1.025)
Urobilinogen, UA: 0.2 E.U./dL
pH, UA: 7.5 (ref 5.0–8.0)

## 2019-05-25 LAB — CBC WITH DIFFERENTIAL/PLATELET
Basophils Absolute: 0.1 10*3/uL (ref 0.0–0.1)
Basophils Relative: 1.2 % (ref 0.0–3.0)
Eosinophils Absolute: 0.4 10*3/uL (ref 0.0–0.7)
Eosinophils Relative: 8.1 % — ABNORMAL HIGH (ref 0.0–5.0)
HCT: 41.5 % (ref 39.0–52.0)
Hemoglobin: 14.5 g/dL (ref 13.0–17.0)
Lymphocytes Relative: 27.6 % (ref 12.0–46.0)
Lymphs Abs: 1.2 10*3/uL (ref 0.7–4.0)
MCHC: 35.1 g/dL (ref 30.0–36.0)
MCV: 94 fl (ref 78.0–100.0)
Monocytes Absolute: 0.4 10*3/uL (ref 0.1–1.0)
Monocytes Relative: 8.6 % (ref 3.0–12.0)
Neutro Abs: 2.4 10*3/uL (ref 1.4–7.7)
Neutrophils Relative %: 54.5 % (ref 43.0–77.0)
Platelets: 258 10*3/uL (ref 150.0–400.0)
RBC: 4.41 Mil/uL (ref 4.22–5.81)
RDW: 12.3 % (ref 11.5–15.5)
WBC: 4.4 10*3/uL (ref 4.0–10.5)

## 2019-05-25 LAB — COMPREHENSIVE METABOLIC PANEL
ALT: 20 U/L (ref 0–53)
AST: 19 U/L (ref 0–37)
Albumin: 5.1 g/dL (ref 3.5–5.2)
Alkaline Phosphatase: 42 U/L (ref 39–117)
BUN: 16 mg/dL (ref 6–23)
CO2: 30 mEq/L (ref 19–32)
Calcium: 10.4 mg/dL (ref 8.4–10.5)
Chloride: 102 mEq/L (ref 96–112)
Creatinine, Ser: 1.16 mg/dL (ref 0.40–1.50)
GFR: 77.65 mL/min (ref >60.00)
Glucose, Bld: 79 mg/dL (ref 70–99)
Potassium: 4.4 mEq/L (ref 3.5–5.1)
Sodium: 140 mEq/L (ref 135–145)
Total Bilirubin: 0.7 mg/dL (ref 0.2–1.2)
Total Protein: 7.6 g/dL (ref 6.0–8.3)

## 2019-05-25 LAB — TSH: TSH: 2.09 u[IU]/mL (ref 0.35–4.50)

## 2019-05-25 LAB — LIPID PANEL
Cholesterol: 119 mg/dL (ref 0–200)
HDL: 36.9 mg/dL — ABNORMAL LOW (ref >39.00)
LDL Cholesterol: 64 mg/dL (ref 0–99)
NonHDL: 82.58
Total CHOL/HDL Ratio: 3
Triglycerides: 95 mg/dL (ref 0.0–149.0)
VLDL: 19 mg/dL (ref 0.0–40.0)

## 2019-05-25 LAB — T4, FREE: Free T4: 0.93 ng/dL (ref 0.60–1.60)

## 2019-05-25 LAB — VITAMIN D 25 HYDROXY (VIT D DEFICIENCY, FRACTURES): VITD: 53.64 ng/mL (ref 30.00–100.00)

## 2019-05-25 LAB — FOLLICLE STIMULATING HORMONE: FSH: 5.9 m[IU]/mL (ref 1.4–18.1)

## 2019-05-25 LAB — T3, FREE: T3, Free: 4.1 pg/mL (ref 2.3–4.2)

## 2019-05-25 LAB — LUTEINIZING HORMONE: LH: 6.14 m[IU]/mL (ref 1.50–9.30)

## 2019-05-25 MED ORDER — ALBUTEROL SULFATE HFA 108 (90 BASE) MCG/ACT IN AERS: 2.0000 | INHALATION_SPRAY | Freq: Four times a day (QID) | RESPIRATORY_TRACT | 5 refills | Status: DC | PRN

## 2019-05-25 MED ORDER — SUMATRIPTAN SUCCINATE 50 MG PO TABS: 50.0000 mg | ORAL_TABLET | ORAL | 0 refills | Status: DC | PRN

## 2019-05-25 MED ORDER — BUDESONIDE-FORMOTEROL FUMARATE 80-4.5 MCG/ACT IN AERO: 2.0000 | INHALATION_SPRAY | Freq: Two times a day (BID) | RESPIRATORY_TRACT | 12 refills | Status: DC

## 2019-05-25 MED ORDER — DESVENLAFAXINE SUCCINATE ER 100 MG PO TB24: 100.0000 mg | ORAL_TABLET | Freq: Every day | ORAL | 0 refills | Status: DC

## 2019-05-25 MED ORDER — BUSPIRONE HCL 30 MG PO TABS: ORAL_TABLET | ORAL | 0 refills | Status: DC

## 2019-05-25 NOTE — Assessment & Plan Note (Signed)
#   Asthma S:over last at least 3 weeks states noted SOB with flight of steps and wheezing at times. Albuterol inhaler helps with symptoms. Albuterol 3x a day right now.  No regular cough or congestion.  No fever chills  Mother with him and states had bad asthma as a child. Wonder is mask contributes.  Bad as child - even tested for cystic fibrosis A/P: Unclear trigger for asthma but with regular albuterol use-think it is appropriate to try Symbicort twice a day and see him back in about a month-sooner if fails to improve.  No other symptoms to suggest COVID-19 at this time and with time course I think that is unlikely.  Also refill albuterol.  If begins to actively wheeze consider prednisone

## 2019-05-25 NOTE — Progress Notes (Addendum)
Phone: [phone]    Subjective:  Patient presents today for their annual physical. Chief complaint-noted.   See problem oriented charting- ROS- full  review of systems was completed and negative  except for: Fatigue, diarrhea, seasonal allergies, headaches. The following were reviewed and entered/updated in epic: Past Medical History:  Diagnosis Date   Hypertension    IBS (irritable bowel syndrome)    probiotic   Posterior urethral valves    Premature birth    3 months early   Renal disorder    Verbal apraxia    Patient Active Problem List   Diagnosis Date Noted   Major depressive disorder, recurrent episode, moderate (HCC) 06/04/2016    Priority: High   Verbal apraxia     Priority: High   Posterior urethral valves (PUV) 06/04/2016    Priority: Medium   Essential hypertension 06/04/2016    Priority: Medium   Bladder spasms 06/04/2016    Priority: Medium   GAD (generalized anxiety disorder) 06/04/2016    Priority: Medium   CKD (chronic kidney disease), stage II 06/04/2016    Priority: Medium   Mesenteric lymphadenopathy 07/27/2016    Priority: Low   GERD (gastroesophageal reflux disease) 06/04/2016    Priority: Low   Premature birth     Priority: Low   IBS (irritable bowel syndrome)     Priority: Low   Social anxiety disorder 05/04/2018   Insomnia 05/04/2018   Past Surgical History:  Procedure Laterality Date   ESOPHAGEAL MANOMETRY N/A 01/23/2016   Procedure: ESOPHAGEAL MANOMETRY (EM);  Surgeon: Charna Elizabeth, MD;  Location: [facility];  Service: Endoscopy;  Laterality: N/A;   LASIK     eye surgery july 2017   NEPHRECTOMY     rght    TONSILLECTOMY      Family History  Problem Relation Age of Onset   Breast cancer Mother        survivor   Hypertension Mother    Hyperlipidemia Mother    Other Father        pituitary tumor- gamma knife radiation   Heart disease Maternal Grandfather    Prostate cancer Maternal Grandfather         60s   Melanoma Sister    Prostate cancer Paternal Grandfather        88s    Medications- reviewed and updated Current Outpatient Medications  Medication Sig Dispense Refill   azelastine (ASTELIN) 0.1 % nasal spray Place 2 sprays into both nostrils 2 (two) times daily. 30 mL 12   busPIRone (BUSPAR) 30 MG tablet TAKE 1 TABLET BY MOUTH TWICE A DAY 180 tablet 0   clonazePAM (KLONOPIN) 1 MG tablet Take 1 tablet (1 mg total) by mouth 3 (three) times daily as needed for anxiety. 90 tablet 1   desvenlafaxine (PRISTIQ) 100 MG 24 hr tablet Take 1 tablet (100 mg total) by mouth daily. 90 tablet 0   losartan (COZAAR) 100 MG tablet Take 1 tablet by mouth every evening.      albuterol (VENTOLIN HFA) 108 (90 Base) MCG/ACT inhaler Inhale 2 puffs into the lungs every 6 (six) hours as needed for wheezing or shortness of breath. 18 g 5   budesonide-formoterol (SYMBICORT) 80-4.5 MCG/ACT inhaler Inhale 2 puffs into the lungs 2 (two) times daily. 1 Inhaler 12   SUMAtriptan (IMITREX) 50 MG tablet Take 1 tablet (50 mg total) by mouth every 2 (two) hours as needed for migraine (max 2 per day). May repeat in 2 hours if headache persists or recurs.  10 tablet 0   No current facility-administered medications for this visit.     Allergies-reviewed and updated Allergies  Allergen Reactions   Lisinopril Other (See Comments)   Other Other (See Comments)    Pain med? Unsure of medication, caused n/v    Social History   Social History Narrative   Single. Lives with mom and dad. His brother and wife and nephew also live with him. Brother and sister- healthy.       Full time at [employer]. Studying psychology with biology minor.    Thinking grad school for psychology- would plan for research over clinical      Objective:  BP 124/62    Pulse (!) 110    Temp 99 F (37.2 C)    Ht 6\' 1"  (1.854 m)    Wt 174 lb 3.2 oz (79 kg)    SpO2 100%    BMI 22.98 kg/m  Gen: NAD, resting  comfortably HEENT: Mask not removed due to covid 19. TM normal. Bridge of nose normal. Eyelids normal.  Neck: no thyromegaly or cervical lymphadenopathy  CV: RRR (not tachycardic on my exam) no murmurs rubs or gallops Lungs: CTAB no crackles, wheeze, rhonchi Abdomen: soft/nontender/nondistended/normal bowel sounds. No rebound or guarding.  Ext: no edema Skin: warm, dry Neuro: grossly normal, moves all extremities, PERRLA, verbal apraxia noted     Assessment and Plan:  23 y.o. male presenting for annual physical.  Health Maintenance counseling: 1. Anticipatory guidance: Patient counseled regarding regular dental exams yes q6 months, eye exams yes,  avoiding smoking and second hand smoke yes , limiting alcohol to 0 beverages per day (does not drink).   2. Risk factor reduction:  Advised patient of need for regular exercise and diet rich and fruits and vegetables to reduce risk of heart attack and stroke. Exercise- 1 time a week walking 2mi- advised to increase. Diet-eats out and at home. Reasonably healthy diet- actually we are both happy with weight gain Wt Readings from Last 3 Encounters:  05/25/19 174 lb 3.2 oz (79 kg)  05/22/18 163 lb 6.1 oz (74.1 kg)  04/01/17 159 lb 14.4 oz (72.5 kg)  3. Immunizations/screenings/ancillary studies- had flu shot.  Immunization History  Administered Date(s) Administered   Influenza,inj,Quad PF,6+ Mos 05/22/2018, 03/12/2019   Influenza-Unspecified 03/08/2016   Tdap 05/22/2018  4. Prostate cancer screening- no first degree relatives. 2 grandfathers in 6350s- we will start his screening at 745  5. Colon cancer screening -  no family history, start at age 245- did have greatgrandfather with colon cancer but should not increase his risk. Mom with multiple colon polyps at age 23- she will check with her GI doctor- we may move him up to 35 or 40 depending on their opinion.  6. Skin cancer screening/prevention- no dermatologist. Sister with melanoma advised  dermatology visit- mother agrees and will support this.  regular sunscreen use. Denies worrisome, changing, or new skin lesions.  7. Testicular cancer screening- advised monthly self exams -yes 8. STD screening- patient opts not to do this 9. Not a smoker  Status of chronic or acute concerns   Social update-graduates dec 2020!   IBS-better over the last day or 2-worse with recent stress-see problem-oriented note  I initially tachycardic but when I checked not noted  Posterior urethral valves (PUV)/CKD (chronic kidney disease), stage II-[facility] and [facility]kel [facility].  Remains on angiotensin receptor blocker  Essential hypertension-compliant with losartan 100  mg  Recommended follow up: Recommended 1 month follow-up Future Appointments  Date Time Provider [place]  05/26/2019 11:00 AM Bauert, Nicolasa Ducking, LCSW [facility] None  06/02/2019 11:00 AM Bauert, Nicolasa Ducking, LCSW [facility] None  06/09/2019 11:00 AM Bauert, Nicolasa Ducking, LCSW [facility] None  06/16/2019 10:00 AM Mendelson, Cynda Familia None  06/25/2019  4:20 PM [name], MD [facility]   Lab/Order associations: fasting status was not determined   ICD-10-CM   1. Preventative health care  Z00.00 CBC with Differential/Platelet    Comprehensive metabolic panel    Lipid panel    Vitamin D  (25 hydroxy )    Prolactin    FSH    LH    TSH    T4, free    T3, free    Growth hormone    POCT Urinalysis Dipstick (Automated)    POCT Urinalysis Dipstick (Automated)  Labs primarily ordered for problem oriented reasons  Return precautions advised.  Garret Reddish, MD

## 2019-05-25 NOTE — Patient Instructions (Addendum)
Refilled psych meds short term  Try sumatriptan for headaches in case these are migraines. Let me know if they are helpful or not. Can get neurology referral if needed.   Try symbicort twice a day- lets follow up in person 3-4 weeks to check in on both issue.  -You can also use albuterol as needed-our goal is to get your albuterol use down to twice a week or less. -If you are not improving or symptoms worsen please call us or seek care in the emergency room  Can do video visit if need be.    Please stop by lab before you go If you do not have mychart- we will call you about results within 5 business days of [place] receiving them.  If you have mychart- we will send your results within 3 business days of [place] receiving them.  If abnormal or we want to clarify a result, we will call or mychart you to make sure you receive the message.  If you have questions or concerns or don't hear within 5-7 days, please send [place] a message or call us.

## 2019-05-25 NOTE — Progress Notes (Addendum)
Phone [phone] In person visit   Subjective:   James Matthews is a 23 y.o. year old very pleasant male patient who presents for/with See problem oriented charting Chief Complaint  Patient presents with  . Depression  . Headaches   ROS- Fatigue, diarrhea, seasonal allergies, headaches.  Light sensitivity when has headache.  Frustration with recent psychiatry experience  Past Medical History-  Patient Active Problem List   Diagnosis Date Noted  . Major depressive disorder, recurrent episode, moderate (HCC) 06/04/2016    Priority: High  . Verbal apraxia     Priority: High  . Posterior urethral valves (PUV) 06/04/2016    Priority: Medium  . Essential hypertension 06/04/2016    Priority: Medium  . Bladder spasms 06/04/2016    Priority: Medium  . GAD (generalized anxiety disorder) 06/04/2016    Priority: Medium  . CKD (chronic kidney disease), stage II 06/04/2016    Priority: Medium  . Mesenteric lymphadenopathy 07/27/2016    Priority: Low  . GERD (gastroesophageal reflux disease) 06/04/2016    Priority: Low  . Premature birth     Priority: Low  . IBS (irritable bowel syndrome)     Priority: Low  . Asthma, moderate persistent 05/25/2019  . Social anxiety disorder 05/04/2018  . Insomnia 05/04/2018    Medications- reviewed and updated Current Outpatient Medications  Medication Sig Dispense Refill  . azelastine (ASTELIN) 0.1 % nasal spray Place 2 sprays into both nostrils 2 (two) times daily. 30 mL 12  . busPIRone (BUSPAR) 30 MG tablet TAKE 1 TABLET BY MOUTH TWICE A DAY 180 tablet 0  . clonazePAM (KLONOPIN) 1 MG tablet Take 1 tablet (1 mg total) by mouth 3 (three) times daily as needed for anxiety. 90 tablet 1  . desvenlafaxine (PRISTIQ) 100 MG 24 hr tablet Take 1 tablet (100 mg total) by mouth daily. 90 tablet 0  . losartan (COZAAR) 100 MG tablet Take 1 tablet by mouth every evening.     Marland Kitchen albuterol (VENTOLIN HFA) 108 (90 Base) MCG/ACT inhaler Inhale 2 puffs  into the lungs every 6 (six) hours as needed for wheezing or shortness of breath. 18 g 5  . budesonide-formoterol (SYMBICORT) 80-4.5 MCG/ACT inhaler Inhale 2 puffs into the lungs 2 (two) times daily. 1 Inhaler 12  . SUMAtriptan (IMITREX) 50 MG tablet Take 1 tablet (50 mg total) by mouth every 2 (two) hours as needed for migraine (max 2 per day). May repeat in 2 hours if headache persists or recurs. 10 tablet 0   No current facility-administered medications for this visit.      Objective:  BP 124/62   Pulse (!) 110   Temp 99 F (37.2 C)   Ht 6\' 1"  (1.854 m)   Wt 174 lb 3.2 oz (79 kg)   SpO2 100%   BMI 22.98 kg/m  Gen: NAD, resting comfortably Neuro: CN II-XII intact, sensation and reflexes normal throughout, 5/5 muscle strength in bilateral upper and lower extremities. Normal finger to nose. Normal rapid alternating movements. No pronator drift. Normal romberg. Normal gait.  Verbal apraxia noted Not tachycardic on my exam     Assessment and Plan   # Daily Headaches S: Getting headaches on daily basis- taking tylenol on daily basis. Started 2 months ago. No obvious trigger. Trying to stay hydrated.  Stress high with covid and college. History of headaches but not usually this frequent- more often as a child.  Pain up to 7-8/10. Tylenol gets it down to a 5/10. Tension all over  head but focuses on left frontal portion of scalp.   Usually not disabling but they are at times. Laying in dark room helps. Light sensitivity. Diagnosed with migraines as a child. Tension grows and then pain worsens- used to take advil when he didn't know he wasn't supposed to take it given unilateral kidney  Feels mental exhaustion due to speech disorder and feels like that can trigger it A/P: 23 year old male with history of migraines.  I think it is reasonable to trial sumatriptan given recurrence of migraines-asked him to watch his blood pressure but well controlled today so I think he can tolerate it.  -discussed potential for rebound HA and limiting tylenol and sumatriptan to twice a week -He is very concerned because of father's history of pituitary tumor which I certainly understand-we will get pituitary labs-appears cortisol was not counted-if all of your tests are normal I am okay deferring that unless patient wants to come back to complete this.  # Asthma S:over last at least 3 weeks states noted SOB with flight of steps and wheezing at times. Albuterol inhaler helps with symptoms. Albuterol 3x a day right now.  No regular cough or congestion.  No fever chills  Mother with him and states had bad asthma as a child. Wonder is mask contributes.  Bad as child - even tested for cystic fibrosis A/P: Unclear trigger for asthma but with regular albuterol use-think it is appropriate to try Symbicort twice a day and see him back in about a month-sooner if fails to improve.  No other symptoms to suggest COVID-19 at this time and with time course I think that is unlikely.  Also refill albuterol.  If begins to actively wheeze consider prednisone  # Major depressive disorder, recurrent episode, moderate (HCC)/GAD (generalized anxiety disorder)- S:patient has been working with Melony Overlyeresa hurst, PA but did not have the best experience lately - he would like to get another opinion.  Her plan has been to start Seroquel which really concerned him.  Currently on buspirone, Klonopin, Pristiq  A/P: Patient asks that I provide a short-term supply of buspirone, Pristiq while he transitions to new psychiatrist-he still has clonazepam available and I told him controlled substances would be best done by psychiatry.  To help him not have a gap in medication-did refill these today  #Low vitamin D S: Patient with low vitamin D in the past A/P: Patient would like to have updated vitamin D level-assessed today-consider 50,000 unit weekly dose if needed  #CKD stage II and posterior urethral valves-patient asked for updated  urinalysis as well as kidney check  Recommended follow up: 1 month follow-up Future Appointments  Date Time Provider Department Center  05/26/2019 11:00 AM Bauert, Rella Larveerri W, LCSW [facility] None  06/02/2019 11:00 AM Bauert, Rella Larveerri W, LCSW [facility] None  06/09/2019 11:00 AM Bauert, Rella Larveerri W, LCSW [facility] None  06/16/2019 10:00 AM Mendelson, Suzie PortelaJenna [facility] None  06/25/2019  4:20 PM Shelva MajesticHunter, Kenrick Pore O, MD [facility]    Lab/Order associations:   ICD-10-CM   1. Major depressive disorder, recurrent episode, moderate (HCC)  F33.1   2. Posterior urethral valves (PUV)  Q64.2   3. GAD (generalized anxiety disorder)  F41.1   4. Essential hypertension  I10 CBC with Differential/Platelet    Comprehensive metabolic panel    Lipid panel    POCT Urinalysis Dipstick (Automated)    POCT Urinalysis Dipstick (Automated)  5. CKD (chronic kidney disease), stage II  N18.2   6. Vitamin D deficiency  E55.9 Vitamin  D  (25 hydroxy )  7. Daily headache  R51.9 Prolactin    FSH    LH    TSH    T4, free    T3, free    Growth hormone  8. Family history of benign neoplasm of pituitary gland and craniopharyngeal pouch  Z84.89 Prolactin    FSH    LH    TSH    T4, free    T3, free    Growth hormone    Meds ordered this encounter  Medications  . SUMAtriptan (IMITREX) 50 MG tablet    Sig: Take 1 tablet (50 mg total) by mouth every 2 (two) hours as needed for migraine (max 2 per day). May repeat in 2 hours if headache persists or recurs.    Dispense:  10 tablet    Refill:  0  . budesonide-formoterol (SYMBICORT) 80-4.5 MCG/ACT inhaler    Sig: Inhale 2 puffs into the lungs 2 (two) times daily.    Dispense:  1 Inhaler    Refill:  12  . desvenlafaxine (PRISTIQ) 100 MG 24 hr tablet    Sig: Take 1 tablet (100 mg total) by mouth daily.    Dispense:  90 tablet    Refill:  0  . busPIRone (BUSPAR) 30 MG tablet    Sig: TAKE 1 TABLET BY MOUTH TWICE A DAY    Dispense:  180 tablet    Refill:  0  . albuterol (VENTOLIN  HFA) 108 (90 Base) MCG/ACT inhaler    Sig: Inhale 2 puffs into the lungs every 6 (six) hours as needed for wheezing or shortness of breath.    Dispense:  18 g    Refill:  5    Return precautions advised.  Garret Reddish, MD

## 2019-05-26 ENCOUNTER — Ambulatory Visit (INDEPENDENT_AMBULATORY_CARE_PROVIDER_SITE_OTHER): Payer: BC Managed Care – PPO | Admitting: Psychology

## 2019-05-26 DIAGNOSIS — F5101 Primary insomnia: Secondary | ICD-10-CM | POA: Diagnosis not present

## 2019-05-26 DIAGNOSIS — F331 Major depressive disorder, recurrent, moderate: Secondary | ICD-10-CM | POA: Diagnosis not present

## 2019-05-27 LAB — PROLACTIN: Prolactin: 5.2 ng/mL (ref 2.0–18.0)

## 2019-05-27 LAB — GROWTH HORMONE: Growth Hormone: <0.1 ng/mL (ref <=7.1)

## 2019-05-31 ENCOUNTER — Encounter: Payer: Self-pay | Admitting: Family Medicine

## 2019-06-01 ENCOUNTER — Ambulatory Visit: Payer: BC Managed Care – PPO | Admitting: Family Medicine

## 2019-06-01 ENCOUNTER — Ambulatory Visit: Payer: BC Managed Care – PPO | Admitting: Physician Assistant

## 2019-06-02 ENCOUNTER — Ambulatory Visit: Payer: Self-pay | Admitting: Psychology

## 2019-06-02 ENCOUNTER — Ambulatory Visit (INDEPENDENT_AMBULATORY_CARE_PROVIDER_SITE_OTHER): Payer: BC Managed Care – PPO | Admitting: Psychology

## 2019-06-02 ENCOUNTER — Encounter: Payer: Self-pay | Admitting: Family Medicine

## 2019-06-02 ENCOUNTER — Ambulatory Visit (INDEPENDENT_AMBULATORY_CARE_PROVIDER_SITE_OTHER): Payer: BC Managed Care – PPO | Admitting: Family Medicine

## 2019-06-02 VITALS — BP 130/88 | HR 68 | Temp 96.9°F | Ht 73.0 in | Wt 174.0 lb

## 2019-06-02 DIAGNOSIS — J4540 Moderate persistent asthma, uncomplicated: Secondary | ICD-10-CM | POA: Diagnosis not present

## 2019-06-02 DIAGNOSIS — F411 Generalized anxiety disorder: Secondary | ICD-10-CM | POA: Diagnosis not present

## 2019-06-02 DIAGNOSIS — R519 Headache, unspecified: Secondary | ICD-10-CM | POA: Diagnosis not present

## 2019-06-02 DIAGNOSIS — F331 Major depressive disorder, recurrent, moderate: Secondary | ICD-10-CM

## 2019-06-02 DIAGNOSIS — F5101 Primary insomnia: Secondary | ICD-10-CM | POA: Diagnosis not present

## 2019-06-02 MED ORDER — SUMATRIPTAN SUCCINATE 50 MG PO TABS: 50.0000 mg | ORAL_TABLET | ORAL | 1 refills | Status: DC | PRN

## 2019-06-02 MED ORDER — PREDNISONE 20 MG PO TABS: ORAL_TABLET | ORAL | 0 refills | Status: DC

## 2019-06-02 NOTE — Progress Notes (Signed)
Phone [phone] Virtual visit via Video note   Subjective:  Chief complaint: Chief Complaint  Patient presents with  . Headache   This visit type was conducted due to national recommendations for restrictions regarding the COVID-19 Pandemic (e.g. social distancing).  This format is felt to be most appropriate for this patient at this time balancing risks to patient and risks to population by having him in for in person visit.  No physical exam was performed (except for noted visual exam or audio findings with Telehealth visits).    Our team/I connected with James Matthews at  9:20 AM EST by a video enabled telemedicine application (doxy.me or caregility through epic) and verified that I am speaking with the correct person using two identifiers.  Location patient: [place] Location provider: [facility], office Persons participating in the virtual visit:  patient  Our team/I discussed the limitations of evaluation and management by telemedicine and the availability of in person appointments. In light of current covid-19 pandemic, patient also understands that we are trying to protect them by minimizing in office contact if at all possible.  The patient expressed consent for telemedicine visit and agreed to proceed. Patient understands insurance will be billed.   ROS-no fever/chills.  Intermittent shortness of breath sensation without significant wheeze but improved on albuterol.  Continue daily headaches but improved severity  This visit occurred during the SARS-CoV-2 public health emergency.  Safety protocols were in place, including screening questions prior to the visit, additional usage of staff PPE, and extensive cleaning of exam room while observing appropriate contact time as indicated for disinfecting solutions.   Past Medical History-  Patient Active Problem List   Diagnosis Date Noted  . Daily headache 06/02/2019    Priority: High  . Asthma, moderate persistent  05/25/2019    Priority: High  . Major depressive disorder, recurrent episode, moderate (HCC) 06/04/2016    Priority: High  . Verbal apraxia     Priority: High  . Posterior urethral valves (PUV) 06/04/2016    Priority: Medium  . Essential hypertension 06/04/2016    Priority: Medium  . Bladder spasms 06/04/2016    Priority: Medium  . GAD (generalized anxiety disorder) 06/04/2016    Priority: Medium  . CKD (chronic kidney disease), stage II 06/04/2016    Priority: Medium  . Mesenteric lymphadenopathy 07/27/2016    Priority: Low  . GERD (gastroesophageal reflux disease) 06/04/2016    Priority: Low  . Premature birth     Priority: Low  . IBS (irritable bowel syndrome)     Priority: Low  . Social anxiety disorder 05/04/2018  . Insomnia 05/04/2018    Medications- reviewed and updated Current Outpatient Medications  Medication Sig Dispense Refill  . albuterol (VENTOLIN HFA) 108 (90 Base) MCG/ACT inhaler Inhale 2 puffs into the lungs every 6 (six) hours as needed for wheezing or shortness of breath. 18 g 5  . azelastine (ASTELIN) 0.1 % nasal spray Place 2 sprays into both nostrils 2 (two) times daily. 30 mL 12  . budesonide-formoterol (SYMBICORT) 80-4.5 MCG/ACT inhaler Inhale 2 puffs into the lungs 2 (two) times daily. 1 Inhaler 12  . busPIRone (BUSPAR) 30 MG tablet TAKE 1 TABLET BY MOUTH TWICE A DAY 180 tablet 0  . clonazePAM (KLONOPIN) 1 MG tablet Take 1 tablet (1 mg total) by mouth 3 (three) times daily as needed for anxiety. 90 tablet 1  . desvenlafaxine (PRISTIQ) 100 MG 24 hr tablet Take 1 tablet (100 mg total) by mouth daily. 90  tablet 0  . losartan (COZAAR) 100 MG tablet Take 1 tablet by mouth every evening.     . SUMAtriptan (IMITREX) 50 MG tablet Take 1 tablet (50 mg total) by mouth every 2 (two) hours as needed for migraine (max 2 per day). May repeat in 2 hours if headache persists or recurs. 10 tablet 0   No current facility-administered medications for this visit.       Objective:  BP 130/88   Pulse 68   Temp (!) 96.9 F (36.1 C) (Oral)   Ht 6\' 1"  (1.854 m)   Wt 174 lb (78.9 kg)   BMI 22.96 kg/m  Gen: NAD, resting comfortably     Assessment and Plan   # Daily headaches patient with history of migraines S:23 year old male with history of migraines with daily headaches on tylenol. May have had rebound headaches contributting to recurrent migraines. We started on sumatriptan he has some improvement but is still having tension headaches and taking Tylenol for that We also did testing for any obvious pituitary tumor- pituitary hormones were normal. Only test we did not get was cortisol- offered again today-he is okay deferring and talking examined with neurology as below  Has not had as much relief as he would like- headaches can vary from left to right to frontal portion of head. Headaches seem to worsen at nighttime. Also getting some nausea with the headaches.   Sumatriptan is somewhat helpful but ends up having to taking tylenol an hour later- has not taken doses.  Overall he has had better control since starting sumatriptan A/P: Continued daily headaches despite trial of sumatriptan-we are going to be using prednisone for his asthma and I am hopeful this may help reduce rebound headaches.  Advised him to limit sumatriptan to twice a week and try to limit Tylenol to twice a week as well-did an urgent referral to neurology given how distressing this is the patient. -We discussed potential preventative therapies but he wanted to defer this conversation to neurology -Discussed trialing repeat sumatriptan 2 hours after initial use-max 2 tablets/day and max 2 days/week  # Asthma S: patient was using albuterol 3x a day last visit. We opted to start symbicort twice a day at 11/16 visit to see if we could better control his asthma.   Today, he reports He has been taking Symbicort twice a day. He is still taking the albuterol 2x a day- feels short of breath and  takes albuterol and feels better.  A/P: Still with poor control but improved despite addition of Symbicort-we are going to use a 7-day course of prednisone.  We have follow-up in mid December to check in.  He should let us know if has new or worsening symptoms.   # Major depression, recurrent episode, moderate/GAD S:Patient was transitioning from [place] but did not have best experience. We provided buspirone, Klonopin, Pristiq short term as he transitions to a new psychiatrist. He has an appointment with [place] [facility] on  06/26/2019. He is also going to get tested for ADD at that time.  He believes they will be treating anxiety and depression Depression screen [facility] 2/9 06/02/2019  Decreased Interest 0  Down, Depressed, Hopeless 0  PHQ - 2 Score 0  Altered sleeping 2  Tired, decreased energy 2  Change in appetite 0  Feeling bad or failure about yourself  0  Trouble concentrating 3  Moving slowly or fidgety/restless 0  Suicidal thoughts 0  PHQ-9 Score 7  Difficult doing work/chores Somewhat difficult  A/P: Mild poor control today based on PHQ-9 but no anhedonia or depressed mood-I suspect reasonable control on current regimen but some distress from ongoing headaches-hopeful this improves with headache treatment-thankful he has psychiatry follow-up -We discussed potential for worsening anxiety on prednisone-hopefully this does not happen as I think this may help with both his headaches and his asthma  Recommended follow up:  scheduled for 06/25/2019-we will keep this for follow-up Future Appointments  Date Time Provider Department Center  06/02/2019 11:00 AM Bauert, Rella Larve, LCSW [facility] None  06/09/2019 11:00 AM Bauert, Rella Larve, LCSW [facility] None  06/16/2019 10:00 AM Mendelson, Suzie Portela None  06/25/2019  4:20 PM Shelva Majestic, MD [facility]    Lab/Order associations:   ICD-10-CM   1. Daily headache  R51.9   2. Moderate persistent asthma without  complication  J45.40   3. Major depressive disorder, recurrent episode, moderate (HCC)  F33.1   4. GAD (generalized anxiety disorder)  F41.1    Return precautions advised.  Tana Conch, MD

## 2019-06-02 NOTE — Patient Instructions (Signed)
   Depression screen [facility] 2/9 06/02/2019  Decreased Interest 0  Down, Depressed, Hopeless 0  PHQ - 2 Score 0  Altered sleeping 2  Tired, decreased energy 2  Change in appetite 0  Feeling bad or failure about yourself  0  Trouble concentrating 3  Moving slowly or fidgety/restless 0  Suicidal thoughts 0  PHQ-9 Score 7  Difficult doing work/chores Somewhat difficult    Recommended follow up: Return for next already scheduled visit.

## 2019-06-09 ENCOUNTER — Ambulatory Visit: Payer: BC Managed Care – PPO | Admitting: Psychology

## 2019-06-10 ENCOUNTER — Ambulatory Visit (INDEPENDENT_AMBULATORY_CARE_PROVIDER_SITE_OTHER): Payer: BC Managed Care – PPO | Admitting: Psychology

## 2019-06-10 DIAGNOSIS — F331 Major depressive disorder, recurrent, moderate: Secondary | ICD-10-CM

## 2019-06-10 DIAGNOSIS — F5101 Primary insomnia: Secondary | ICD-10-CM | POA: Diagnosis not present

## 2019-06-16 ENCOUNTER — Ambulatory Visit: Payer: BC Managed Care – PPO | Admitting: Clinical

## 2019-06-20 ENCOUNTER — Ambulatory Visit (INDEPENDENT_AMBULATORY_CARE_PROVIDER_SITE_OTHER): Payer: BC Managed Care – PPO | Admitting: Clinical

## 2019-06-20 DIAGNOSIS — F331 Major depressive disorder, recurrent, moderate: Secondary | ICD-10-CM | POA: Diagnosis not present

## 2019-06-24 ENCOUNTER — Other Ambulatory Visit: Payer: Self-pay

## 2019-06-24 ENCOUNTER — Ambulatory Visit (INDEPENDENT_AMBULATORY_CARE_PROVIDER_SITE_OTHER): Payer: BC Managed Care – PPO | Admitting: Clinical

## 2019-06-24 DIAGNOSIS — F331 Major depressive disorder, recurrent, moderate: Secondary | ICD-10-CM

## 2019-06-25 ENCOUNTER — Encounter: Payer: Self-pay | Admitting: Family Medicine

## 2019-06-25 ENCOUNTER — Ambulatory Visit (INDEPENDENT_AMBULATORY_CARE_PROVIDER_SITE_OTHER): Payer: BC Managed Care – PPO | Admitting: Family Medicine

## 2019-06-25 VITALS — BP 126/78 | HR 60 | Temp 98.4°F | Ht 73.0 in | Wt 179.0 lb

## 2019-06-25 DIAGNOSIS — R519 Headache, unspecified: Secondary | ICD-10-CM

## 2019-06-25 DIAGNOSIS — J4540 Moderate persistent asthma, uncomplicated: Secondary | ICD-10-CM

## 2019-06-25 DIAGNOSIS — F331 Major depressive disorder, recurrent, moderate: Secondary | ICD-10-CM

## 2019-06-25 MED ORDER — BUDESONIDE-FORMOTEROL FUMARATE 160-4.5 MCG/ACT IN AERO: 2.0000 | INHALATION_SPRAY | Freq: Two times a day (BID) | RESPIRATORY_TRACT | 12 refills | Status: DC

## 2019-06-25 NOTE — Progress Notes (Signed)
Phone [phone] In person visit   Subjective:   James Matthews is a 23 y.o. year old very pleasant male patient who presents for/with See problem oriented charting Chief Complaint  Patient presents with  . Follow-up  . Headache   ROS- still with headaches. No chest pain. Shortness of breath and wheezing noted.    This visit occurred during the SARS-CoV-2 public health emergency.  Safety protocols were in place, including screening questions prior to the visit, additional usage of staff PPE, and extensive cleaning of exam room while observing appropriate contact time as indicated for disinfecting solutions.   Past Medical History-  Patient Active Problem List   Diagnosis Date Noted  . Daily headache 06/02/2019    Priority: High  . Asthma, moderate persistent 05/25/2019    Priority: High  . Major depressive disorder, recurrent episode, moderate (HCC) 06/04/2016    Priority: High  . Verbal apraxia     Priority: High  . Posterior urethral valves (PUV) 06/04/2016    Priority: Medium  . Essential hypertension 06/04/2016    Priority: Medium  . Bladder spasms 06/04/2016    Priority: Medium  . GAD (generalized anxiety disorder) 06/04/2016    Priority: Medium  . CKD (chronic kidney disease), stage II 06/04/2016    Priority: Medium  . Mesenteric lymphadenopathy 07/27/2016    Priority: Low  . GERD (gastroesophageal reflux disease) 06/04/2016    Priority: Low  . Premature birth     Priority: Low  . IBS (irritable bowel syndrome)     Priority: Low  . Social anxiety disorder 05/04/2018  . Insomnia 05/04/2018    Medications- reviewed and updated Current Outpatient Medications  Medication Sig Dispense Refill  . albuterol (VENTOLIN HFA) 108 (90 Base) MCG/ACT inhaler Inhale 2 puffs into the lungs every 6 (six) hours as needed for wheezing or shortness of breath. 18 g 5  . azelastine (ASTELIN) 0.1 % nasal spray Place 2 sprays into both nostrils 2 (two) times daily. 30 mL  12  . busPIRone (BUSPAR) 30 MG tablet TAKE 1 TABLET BY MOUTH TWICE A DAY 180 tablet 0  . clonazePAM (KLONOPIN) 1 MG tablet Take 1 tablet (1 mg total) by mouth 3 (three) times daily as needed for anxiety. 90 tablet 1  . desvenlafaxine (PRISTIQ) 100 MG 24 hr tablet Take 1 tablet (100 mg total) by mouth daily. 90 tablet 0  . losartan (COZAAR) 100 MG tablet Take 1 tablet by mouth every evening.     . SUMAtriptan (IMITREX) 50 MG tablet Take 1 tablet (50 mg total) by mouth every 2 (two) hours as needed for migraine (max 2 per day). May repeat in 2 hours if headache persists or recurs. 10 tablet 1  . budesonide-formoterol (SYMBICORT) 160-4.5 MCG/ACT inhaler Inhale 2 puffs into the lungs 2 (two) times daily. 1 Inhaler 12   No current facility-administered medications for this visit.     Objective:  BP 126/78   Pulse 60   Temp 98.4 F (36.9 C) (Temporal)   Ht 6\' 1"  (1.854 m)   Wt 179 lb (81.2 kg)   SpO2 93%   BMI 23.62 kg/m  Gen: NAD, resting comfortably CV: RRR  Lungs: nonlabored, normal respiratory rate Abdomen: soft/nondistended    Assessment and Plan   # Headaches/history of migraines S:headaches have improved since last visit. Still present on daily basis but not as severe- does not have to lay down in a dark room. When they do worsen sumatriptan seems to be more helpful.  Lowest level is 2/10 tension in front of scalp and up to 8/10 at its worst.   Headache did seem to calm down after prednisone.  A/P: Daily headaches have improved in intensity after prednisone but still has low level daily- will still follow up with neurology in January - wants to talk about preventative treatments. Migraines at play- sumatriptan helpful- he can continue this.  -due to family history  Of pituitary tumor in past- multiple labs ordered 05/25/2019 but did not do cortisol- he would like to come back for this   # Asthma S:Used prednisone last visit. Down to once a day on albuterol from  3x a day. Still  using symbicort twice a day.   Has a dog that sheds a lot and wonders if that contributes. Allergic to grass. Has carpet.  A/P: Still with poor control- will max out symbicort. Consider singulair - but he is going to discuss with psychiatry given depression  -slightly high eosinophil %- he wants to repeat this- discussed likely allergy related  # Depression/GAD S: Mild poor control last visit on pristiq 100mg , buspirone 30mg  BID- we did a short term refill last refill. He sees [place] attention specialist tomorrow and wants to discuss attention issues. Still has clonazepam from prior psychiatry office Depression screen [facility] 2/9 06/02/2019  Decreased Interest 0  Down, Depressed, Hopeless 0  PHQ - 2 Score 0  Altered sleeping 2  Tired, decreased energy 2  Change in appetite 0  Feeling bad or failure about yourself  0  Trouble concentrating 3  Moving slowly or fidgety/restless 0  Suicidal thoughts 0  PHQ-9 Score 7  Difficult doing work/chores Somewhat difficult  A/P: Patient reports feeling somewhat better from last visit since headaches are better- will defer repeat PHQ9 to follow up after he establishes with new psychiatrist  Recommended follow up: Return in about 6 months (around 12/24/2019) for follow up- or sooner if needed. Future Appointments  Date Time Provider [place]  07/15/2019  7:50 AM Pieter Partridge, DO [facility] None  12/24/2019  4:20 PM Yong Channel Brayton Mars, MD [facility]    Lab/Order associations:   ICD-10-CM   1. Daily headache  R51.9 CBC with Differential future    Cortisol  2. Moderate persistent asthma without complication  X54.00   3. Major depressive disorder, recurrent episode, moderate (HCC)  F33.1     Meds ordered this encounter  Medications  . budesonide-formoterol (SYMBICORT) 160-4.5 MCG/ACT inhaler    Sig: Inhale 2 puffs into the lungs 2 (two) times daily.    Dispense:  1 Inhaler    Refill:  12    Return precautions advised.  Garret Reddish,  MD

## 2019-06-25 NOTE — Patient Instructions (Addendum)
Glad headaches are better but also glad you have neurology follow up  Glad asthma is better but ideal goal for asthma is albuterol twice a week or less- lets see if stronger dose of symbicort helps. Allergens may be playing a role and avoidance if possible is ideal but I dont think you have a great option right now for the carpet or the dog... therefore ask your psychiatrist tomorrow if they would be ok with me using  Singulair/montelukast if asthma does not become better controlled with stronger symbicort  Schedule lab visit - before 9 AM in coming weeks that is convenient for you.   Update me in 1-2 months with how frequently you are using the albuterol per week  Recommended follow up: Return in about 6 months (around 12/24/2019) for follow up- or sooner if needed.

## 2019-06-26 MED ORDER — CLONAZEPAM 1 MG PO TABS: 1.0000 mg | ORAL_TABLET | Freq: Three times a day (TID) | ORAL | 1 refills | Status: DC | PRN

## 2019-06-29 DIAGNOSIS — N1339 Other hydronephrosis: Secondary | ICD-10-CM | POA: Diagnosis not present

## 2019-06-29 DIAGNOSIS — Q600 Renal agenesis, unilateral: Secondary | ICD-10-CM | POA: Diagnosis not present

## 2019-06-29 DIAGNOSIS — R82991 Hypocitraturia: Secondary | ICD-10-CM | POA: Diagnosis not present

## 2019-06-29 DIAGNOSIS — Q642 Congenital posterior urethral valves: Secondary | ICD-10-CM | POA: Diagnosis not present

## 2019-06-29 DIAGNOSIS — N319 Neuromuscular dysfunction of bladder, unspecified: Secondary | ICD-10-CM | POA: Diagnosis not present

## 2019-06-30 ENCOUNTER — Encounter: Payer: Self-pay | Admitting: Family Medicine

## 2019-07-04 ENCOUNTER — Encounter: Payer: Self-pay | Admitting: Family Medicine

## 2019-07-04 DIAGNOSIS — T7840XA Allergy, unspecified, initial encounter: Secondary | ICD-10-CM | POA: Diagnosis not present

## 2019-07-04 DIAGNOSIS — Z20828 Contact with and (suspected) exposure to other viral communicable diseases: Secondary | ICD-10-CM | POA: Diagnosis not present

## 2019-07-04 DIAGNOSIS — J029 Acute pharyngitis, unspecified: Secondary | ICD-10-CM | POA: Diagnosis not present

## 2019-07-04 DIAGNOSIS — R0602 Shortness of breath: Secondary | ICD-10-CM | POA: Diagnosis not present

## 2019-07-07 ENCOUNTER — Encounter: Payer: Self-pay | Admitting: Family Medicine

## 2019-07-14 ENCOUNTER — Encounter: Payer: Self-pay | Admitting: Neurology

## 2019-07-14 ENCOUNTER — Ambulatory Visit (INDEPENDENT_AMBULATORY_CARE_PROVIDER_SITE_OTHER): Payer: BC Managed Care – PPO | Admitting: Clinical

## 2019-07-14 DIAGNOSIS — F331 Major depressive disorder, recurrent, moderate: Secondary | ICD-10-CM

## 2019-07-14 NOTE — Progress Notes (Signed)
Virtual Visit via Video Note The purpose of this virtual visit is to provide medical care while limiting exposure to the novel coronavirus.    Consent was obtained for video visit:  Yes.   Answered questions that patient had about telehealth interaction:  Yes.   I discussed the limitations, risks, security and privacy concerns of performing an evaluation and management service by telemedicine. I also discussed with the patient that there may be a patient responsible charge related to this service. The patient expressed understanding and agreed to proceed.  Pt location: Home Physician Location: Home Name of referring provider:  Shelva Majestic, MD I connected with James Matthews at patients initiation/request on 07/15/2019 at  7:50 AM EST by video enabled telemedicine application and verified that I am speaking with the correct person using two identifiers. Pt MRN:  [id number] Pt DOB:  Apr 21, 1996 Video Participants:  James Christian Mcmillon; mother   History of Present Illness:  James C. Hobin is a 24 year old male with HTN and asthma who presents for headache.  History supplemented by referring provider notes.  He has history of migraines since childhood but have gotten worse about 2 months ago.  He has no explanation for increased frequency.  They are severe right worse than left parietal pressure-like headaches. They are associated with nausea, visual disturbance (red dots) photophobia and phonophobia.  They last from several hours to several days.  They have been occurring daily (previously occurring 3 to 4 times a week).  Triggers include light and sound.  Due to his speech disorder, prolonged talking may be a trigger.  Laying down to rest in dark and quiet room helps. Prednisone taper was ineffective after it was finished.  Remote CT head without contrast from 02/19/2007 to evaluate headache was personally reviewed and demonstrated incidental findings consistent with right  ethmoid sinusitis but no acute intracranial abnormality.  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  No known cause but possibly genetic.  He was born 3 months premature.  Rescue therapy:  Sumatriptan with Tylenol (somewhat effective) Current NSAIDS:  none Current analgesics:  Tylenol (he was previously taking 3 to 4 days a week) Current triptans:  Sumatriptan 50mg  Current ergotamine:  none Current anti-emetic:  none Current muscle relaxants:  none Current anti-anxiolytic:  Clonazepam; busipirone Current sleep aide:  none Current Antihypertensive medications:  losartan Current Antidepressant medications:  Pristiq Current Anticonvulsant medications:  none Current anti-CGRP:  none Current Vitamins/Herbal/Supplements:  none Current Antihistamines/Decongestants:  Astelin Other therapy:  none Hormone/birth control:  none  Past NSAIDS/steroids:  Prednisone. Unable to take NSAIDs due to renal disorder. Past analgesics:  none Past abortive triptans:  none Past abortive ergotamine:  none Past muscle relaxants:  none Past anti-emetic:  Zofran ODT 4mg  Past antihypertensive medications:  lisinopril Past antidepressant/antipsychotic/mood medications:  Fluoxetine, mirtazepine, Seroquel, trazodone Past anticonvulsant medications:  Gabapentin 600mg  Past anti-CGRP:  nonw Past vitamins/Herbal/Supplements:  none Past antihistamines/decongestants:  none Other past therapies:  none  Caffeine:  1 cup coffee daily. Diet:  2 liters water daily.  No soda. Exercise:  Once a week Depression:  Yes but stable; Anxiety:  Yes but stable Other pain:  no Sleep hygiene:  8 hours of sleep a night.  Previously poor Family history of headache:  Mom (migraines); brother (migraines); sister (migraines)  CBC and CMP from 05/25/2019 were unremarkable.   Past Medical History: Past Medical History:  Diagnosis Date  . Hypertension   . IBS (irritable bowel syndrome)  probiotic  . Posterior urethral  valves   . Premature birth    3 months early  . Renal disorder   . Verbal apraxia     Medications: Outpatient Encounter Medications as of 07/15/2019  Medication Sig  . albuterol (VENTOLIN HFA) 108 (90 Base) MCG/ACT inhaler Inhale 2 puffs into the lungs every 6 (six) hours as needed for wheezing or shortness of breath.  Marland Kitchen azelastine (ASTELIN) 0.1 % nasal spray Place 2 sprays into both nostrils 2 (two) times daily.  . budesonide-formoterol (SYMBICORT) 160-4.5 MCG/ACT inhaler Inhale 2 puffs into the lungs 2 (two) times daily.  . busPIRone (BUSPAR) 30 MG tablet TAKE 1 TABLET BY MOUTH TWICE A DAY  . clonazePAM (KLONOPIN) 1 MG tablet Take 1 tablet (1 mg total) by mouth 3 (three) times daily as needed for anxiety.  Marland Kitchen desvenlafaxine (PRISTIQ) 100 MG 24 hr tablet Take 1 tablet (100 mg total) by mouth daily.  Marland Kitchen losartan (COZAAR) 100 MG tablet Take 1 tablet by mouth every evening.   . SUMAtriptan (IMITREX) 50 MG tablet Take 1 tablet (50 mg total) by mouth every 2 (two) hours as needed for migraine (max 2 per day). May repeat in 2 hours if headache persists or recurs.   No facility-administered encounter medications on file as of 07/15/2019.    Allergies: Allergies  Allergen Reactions  . Chocolate     Face swelling/sounds like angioedema "I had to go to urgent care today due to an allergic reaction to a chocolate candy bar. My throat swelled up and was closing, my nasal passages were congested, and my face was hurting. They gave me a steroid shot at the clinic, and then they sent me home presidone to take for a week" 06/2019  . Lisinopril Other (See Comments)  . Other Other (See Comments)    Pain med? Unsure of medication, caused n/v    Family History: Family History  Problem Relation Age of Onset  . Breast cancer Mother        survivor  . Hypertension Mother   . Hyperlipidemia Mother   . Other Father        pituitary tumor- gamma knife radiation  . Heart disease Maternal Grandfather   .  Prostate cancer Maternal Grandfather        34s  . Melanoma Sister   . Prostate cancer Paternal Grandfather        56s    Social History: Social History   Socioeconomic History  . Marital status: Single    Spouse name: Not on file  . Number of children: Not on file  . Years of education: Not on file  . Highest education level: Not on file  Occupational History  . Not on file  Tobacco Use  . Smoking status: Never Smoker  . Smokeless tobacco: Never Used  Substance and Sexual Activity  . Alcohol use: No  . Drug use: No  . Sexual activity: Not on file  Other Topics Concern  . Not on file  Social History Narrative   Single. Lives with mom and dad. His brother and wife and nephew also live with him. Brother and sister- healthy.       Full time at [employer]. Studying psychology with biology minor.    Thinking grad school for psychology- would plan for research over clinical   Social Determinants of Health   Financial Resource Strain:   . Difficulty of Paying Living Expenses: Not on file  Food Insecurity:   .  Worried About Charity fundraiser in the Last Year: Not on file  . Ran Out of Food in the Last Year: Not on file  Transportation Needs:   . Lack of Transportation (Medical): Not on file  . Lack of Transportation (Non-Medical): Not on file  Physical Activity:   . Days of Exercise per Week: Not on file  . Minutes of Exercise per Session: Not on file  Stress:   . Feeling of Stress : Not on file  Social Connections:   . Frequency of Communication with Friends and Family: Not on file  . Frequency of Social Gatherings with Friends and Family: Not on file  . Attends Religious Services: Not on file  . Active Member of Clubs or Organizations: Not on file  . Attends Archivist Meetings: Not on file  . Marital Status: Not on file  Intimate Partner Violence:   . Fear of Current or Ex-Partner: Not on file  . Emotionally Abused: Not on file  . Physically  Abused: Not on file  . Sexually Abused: Not on file    Observations/Objective:   Height 6\' 1"  (1.854 m), weight 179 lb (81.2 kg). No acute distress.  Alert and oriented.  Speech with some hesitancy.  No dysarthria.  Language intact.  Eyes orthophoric on primary gaze.  Face symmetric.  Assessment and Plan:   1.  Chronic migraine without aura, without status migrainosus, not intractable, worsening headaches. 2.  Verbal apraxia.    I would like to start him on Aimovig 70mg .  He is already on an antidepressant.  He has kidney stones and would thus avoid a carbonic anhydrase inhibitor such as topiramate.  Due to baseline depression, I would like to avoid a beta blocker such as propranolol.  1. Due to worsening headaches, will check MRI of brain  2. Aimovig 70mg  every 30 days 3.  Continue sumatriptan 50mg  with acetaminophen for abortive therapy 4.  Limit use of pain relievers to no more than 2 days out of week to prevent risk of rebound or medication-overuse headache. 5.  Keep headache diary. 6.  Follow up in 4 months.   Follow Up Instructions:    -I discussed the assessment and treatment plan with the patient. The patient was provided an opportunity to ask questions and all were answered. The patient agreed with the plan and demonstrated an understanding of the instructions.   The patient was advised to call back or seek an in-person evaluation if the symptoms worsen or if the condition fails to improve as anticipated.   Dudley Major, DO

## 2019-07-15 ENCOUNTER — Other Ambulatory Visit: Payer: Self-pay

## 2019-07-15 ENCOUNTER — Encounter: Payer: Self-pay | Admitting: Neurology

## 2019-07-15 ENCOUNTER — Telehealth (INDEPENDENT_AMBULATORY_CARE_PROVIDER_SITE_OTHER): Payer: BC Managed Care – PPO | Admitting: Neurology

## 2019-07-15 VITALS — Ht 73.0 in | Wt 179.0 lb

## 2019-07-15 DIAGNOSIS — G43709 Chronic migraine without aura, not intractable, without status migrainosus: Secondary | ICD-10-CM

## 2019-07-15 DIAGNOSIS — R482 Apraxia: Secondary | ICD-10-CM | POA: Diagnosis not present

## 2019-07-15 MED ORDER — AIMOVIG 70 MG/ML ~~LOC~~ SOAJ: 70.0000 mg | SUBCUTANEOUS | 11 refills | Status: DC

## 2019-07-22 ENCOUNTER — Telehealth: Payer: Self-pay | Admitting: Neurology

## 2019-07-22 ENCOUNTER — Ambulatory Visit (INDEPENDENT_AMBULATORY_CARE_PROVIDER_SITE_OTHER): Payer: BC Managed Care – PPO | Admitting: Clinical

## 2019-07-22 DIAGNOSIS — F331 Major depressive disorder, recurrent, moderate: Secondary | ICD-10-CM

## 2019-07-22 NOTE — Telephone Encounter (Signed)
Pt to call pharmacy, I do not see any forms, he hasnt called pharmacy yet

## 2019-07-22 NOTE — Telephone Encounter (Signed)
Patient called to check on the status of the prior authorization on Aimovig 70 MG.  [facility]

## 2019-07-23 ENCOUNTER — Telehealth: Payer: Self-pay | Admitting: Neurology

## 2019-07-23 DIAGNOSIS — I129 Hypertensive chronic kidney disease with stage 1 through stage 4 chronic kidney disease, or unspecified chronic kidney disease: Secondary | ICD-10-CM | POA: Diagnosis not present

## 2019-07-23 DIAGNOSIS — N181 Chronic kidney disease, stage 1: Secondary | ICD-10-CM | POA: Diagnosis not present

## 2019-07-23 NOTE — Telephone Encounter (Signed)
Pharm is calling in on patient's Aimovg medication its needing a PA. Thanks!

## 2019-07-24 ENCOUNTER — Encounter: Payer: Self-pay | Admitting: Family Medicine

## 2019-07-24 DIAGNOSIS — R0602 Shortness of breath: Secondary | ICD-10-CM | POA: Diagnosis not present

## 2019-07-24 DIAGNOSIS — R799 Abnormal finding of blood chemistry, unspecified: Secondary | ICD-10-CM | POA: Diagnosis not present

## 2019-07-24 DIAGNOSIS — R079 Chest pain, unspecified: Secondary | ICD-10-CM | POA: Diagnosis not present

## 2019-07-24 NOTE — Telephone Encounter (Signed)
Sent this am in another my chart message   Update:  when I woke up this morning with no physical exercise prior Attached was image of heart rate of 128.   Called patient symptoms have increased a lot over the last 2 weeks. When he has chest tightness it is in center of chest to both sides. He has tried to take albuterol when symptoms start and they will help for about 20 minutes. Does have history of htn in the past. Seen by cardiology but has not been in several years. Bp last night was 120/80. No changes in vision but does have dizziness and SOB

## 2019-07-24 NOTE — Telephone Encounter (Signed)
Called patient reviewed all information with patient. He will go to urgent care.

## 2019-07-27 ENCOUNTER — Other Ambulatory Visit: Payer: Self-pay

## 2019-07-27 ENCOUNTER — Encounter: Payer: Self-pay | Admitting: Family Medicine

## 2019-07-27 ENCOUNTER — Telehealth: Payer: Self-pay | Admitting: Neurology

## 2019-07-27 NOTE — Telephone Encounter (Signed)
Patient is calling in about the imitrex or aimovig medication. He couldn't remember which one it was but he had some questions for the nurse regarding the medication. P.S. Patient has speech impairment, may be a longer phone call- please be kind and aware. Thanks so much!

## 2019-07-28 ENCOUNTER — Ambulatory Visit (INDEPENDENT_AMBULATORY_CARE_PROVIDER_SITE_OTHER): Payer: BC Managed Care – PPO | Admitting: Clinical

## 2019-07-28 ENCOUNTER — Telehealth: Payer: Self-pay | Admitting: Family Medicine

## 2019-07-28 DIAGNOSIS — F331 Major depressive disorder, recurrent, moderate: Secondary | ICD-10-CM

## 2019-07-28 MED ORDER — LEVALBUTEROL TARTRATE 45 MCG/ACT IN AERO: 2.0000 | INHALATION_SPRAY | RESPIRATORY_TRACT | 12 refills | Status: DC | PRN

## 2019-07-28 NOTE — Progress Notes (Signed)
Pt was approved for aimovig 70mg  07/28/19 until 10/25/19   Pt was called and informed

## 2019-07-28 NOTE — Addendum Note (Signed)
Addended by: Shelva Majestic on: 07/28/2019 09:33 PM   Modules accepted: Orders

## 2019-07-28 NOTE — Telephone Encounter (Signed)
Spoke to James Matthews informed him we got the paper work to start the PA for his Aimovig

## 2019-07-28 NOTE — Telephone Encounter (Signed)
Added from phone note sent today:  Patient calls in saying that he went to urgent care for rapid heart rate and sharp tightness, and he had an EKG done which came back normal, they told him that it could be the inhaler. He says that as soon as he takes the inhaler, walking a far distance, and/or standing in a shower for a long period of time he notices that he has the rapid heart rate. He wants to know if it could be the inhaler or something else.

## 2019-07-28 NOTE — Telephone Encounter (Signed)
Called back no answer voice mail left for him to call back

## 2019-07-28 NOTE — Telephone Encounter (Signed)
Added to open my chart sent to provider for review.

## 2019-07-28 NOTE — Telephone Encounter (Signed)
Patient calls in saying that he went to urgent care for rapid heart rate and sharp tightness, and he had an EKG done which came back normal, they told him that it could be the inhaler. He says that as soon as he takes the inhaler, walking a far distance, and/or standing in a shower for a long period of time he notices that he has the rapid heart rate. He wants to know if it could be the inhaler or something else.

## 2019-07-29 ENCOUNTER — Telehealth: Payer: Self-pay

## 2019-07-29 DIAGNOSIS — D2271 Melanocytic nevi of right lower limb, including hip: Secondary | ICD-10-CM | POA: Diagnosis not present

## 2019-07-29 DIAGNOSIS — D225 Melanocytic nevi of trunk: Secondary | ICD-10-CM | POA: Diagnosis not present

## 2019-07-29 DIAGNOSIS — D224 Melanocytic nevi of scalp and neck: Secondary | ICD-10-CM | POA: Diagnosis not present

## 2019-07-29 DIAGNOSIS — D2262 Melanocytic nevi of left upper limb, including shoulder: Secondary | ICD-10-CM | POA: Diagnosis not present

## 2019-07-29 NOTE — Telephone Encounter (Signed)
Pharmacy told patient that his insurance wouldn't cover medication levalbuterol (XOPENEX HFA) 45 MCG/ACT inhaler. [phone] [place], [place] - [address] Phone:  [phone]  Fax:  [phone]     Patient would like a call back

## 2019-07-31 NOTE — Telephone Encounter (Signed)
Called and spoke with pt and he states that he was able to find a coupon and was able to afford the inhaler.

## 2019-08-03 ENCOUNTER — Ambulatory Visit (INDEPENDENT_AMBULATORY_CARE_PROVIDER_SITE_OTHER): Payer: BC Managed Care – PPO | Admitting: Clinical

## 2019-08-03 DIAGNOSIS — F8089 Other developmental disorders of speech and language: Secondary | ICD-10-CM | POA: Diagnosis not present

## 2019-08-03 DIAGNOSIS — F331 Major depressive disorder, recurrent, moderate: Secondary | ICD-10-CM

## 2019-08-03 DIAGNOSIS — F411 Generalized anxiety disorder: Secondary | ICD-10-CM | POA: Diagnosis not present

## 2019-08-05 ENCOUNTER — Other Ambulatory Visit: Payer: Self-pay | Admitting: Family Medicine

## 2019-08-06 ENCOUNTER — Ambulatory Visit
Admission: RE | Admit: 2019-08-06 | Discharge: 2019-08-06 | Disposition: A | Discharge status: Home / Self Care | Payer: BC Managed Care – PPO | Source: Ambulatory Visit | Attending: Neurology | Admitting: Neurology

## 2019-08-06 DIAGNOSIS — R482 Apraxia: Secondary | ICD-10-CM

## 2019-08-06 DIAGNOSIS — G43709 Chronic migraine without aura, not intractable, without status migrainosus: Secondary | ICD-10-CM

## 2019-08-06 MED ORDER — GADOBENATE DIMEGLUMINE 529 MG/ML IV SOLN
16.0000 mL | Freq: Once | INTRAVENOUS | Status: AC | PRN
  Administered 2019-08-06: 16 mL via INTRAVENOUS

## 2019-08-06 NOTE — Telephone Encounter (Signed)
Last OV 06/25/19 Last refill Buspar 05/25/19 #180/0                 Pristiq 05/25/19 #90/0 Next OV 12/24/19

## 2019-08-10 DIAGNOSIS — Z79899 Other long term (current) drug therapy: Secondary | ICD-10-CM | POA: Diagnosis not present

## 2019-08-10 DIAGNOSIS — R482 Apraxia: Secondary | ICD-10-CM | POA: Diagnosis not present

## 2019-08-10 DIAGNOSIS — R41840 Attention and concentration deficit: Secondary | ICD-10-CM | POA: Diagnosis not present

## 2019-08-10 DIAGNOSIS — F419 Anxiety disorder, unspecified: Secondary | ICD-10-CM | POA: Diagnosis not present

## 2019-08-11 ENCOUNTER — Other Ambulatory Visit: Payer: Self-pay | Admitting: Family Medicine

## 2019-08-11 ENCOUNTER — Ambulatory Visit (INDEPENDENT_AMBULATORY_CARE_PROVIDER_SITE_OTHER): Payer: BC Managed Care – PPO | Admitting: Clinical

## 2019-08-11 DIAGNOSIS — F331 Major depressive disorder, recurrent, moderate: Secondary | ICD-10-CM

## 2019-08-12 ENCOUNTER — Other Ambulatory Visit: Payer: Self-pay | Admitting: Family Medicine

## 2019-08-19 ENCOUNTER — Ambulatory Visit (INDEPENDENT_AMBULATORY_CARE_PROVIDER_SITE_OTHER): Payer: BC Managed Care – PPO | Admitting: Clinical

## 2019-08-19 ENCOUNTER — Telehealth: Payer: Self-pay | Admitting: Family Medicine

## 2019-08-19 DIAGNOSIS — F331 Major depressive disorder, recurrent, moderate: Secondary | ICD-10-CM | POA: Diagnosis not present

## 2019-08-19 DIAGNOSIS — R399 Unspecified symptoms and signs involving the genitourinary system: Secondary | ICD-10-CM | POA: Diagnosis not present

## 2019-08-19 NOTE — Telephone Encounter (Signed)
Yes that's fine 

## 2019-08-19 NOTE — Telephone Encounter (Signed)
Patient called in this afternoon saying that he has been having pain when he uses the bathroom and is wanting an appointment for Friday, is okay to use same day?

## 2019-08-20 ENCOUNTER — Ambulatory Visit: Payer: BC Managed Care – PPO | Admitting: Family Medicine

## 2019-08-21 ENCOUNTER — Ambulatory Visit: Payer: BC Managed Care – PPO | Admitting: Family Medicine

## 2019-08-24 ENCOUNTER — Encounter: Payer: Self-pay | Admitting: Family Medicine

## 2019-08-24 ENCOUNTER — Other Ambulatory Visit: Payer: Self-pay | Admitting: Family Medicine

## 2019-08-25 ENCOUNTER — Ambulatory Visit (INDEPENDENT_AMBULATORY_CARE_PROVIDER_SITE_OTHER): Payer: BC Managed Care – PPO | Admitting: Clinical

## 2019-08-25 ENCOUNTER — Other Ambulatory Visit: Payer: Self-pay

## 2019-08-25 DIAGNOSIS — F331 Major depressive disorder, recurrent, moderate: Secondary | ICD-10-CM

## 2019-08-25 MED ORDER — CLONAZEPAM 1 MG PO TABS: 1.0000 mg | ORAL_TABLET | Freq: Three times a day (TID) | ORAL | 1 refills | Status: DC | PRN

## 2019-08-25 NOTE — Telephone Encounter (Signed)
LAST APPOINTMENT DATE: @12182020   NEXT APPOINTMENT DATE:@6 /17/2021   LAST REFILL: 04-18-1976  QTY: #90 1 rf

## 2019-08-25 NOTE — Telephone Encounter (Signed)
Please advise 

## 2019-08-25 NOTE — Progress Notes (Signed)
Thanks for loading this.  I sent in electronically

## 2019-08-26 NOTE — Telephone Encounter (Signed)
I thought I sent this in on 16th?

## 2019-09-01 ENCOUNTER — Ambulatory Visit (INDEPENDENT_AMBULATORY_CARE_PROVIDER_SITE_OTHER): Payer: BC Managed Care – PPO | Admitting: Clinical

## 2019-09-01 DIAGNOSIS — J3081 Allergic rhinitis due to animal (cat) (dog) hair and dander: Secondary | ICD-10-CM | POA: Diagnosis not present

## 2019-09-01 DIAGNOSIS — Z91018 Allergy to other foods: Secondary | ICD-10-CM | POA: Diagnosis not present

## 2019-09-01 DIAGNOSIS — F331 Major depressive disorder, recurrent, moderate: Secondary | ICD-10-CM

## 2019-09-07 ENCOUNTER — Ambulatory Visit (INDEPENDENT_AMBULATORY_CARE_PROVIDER_SITE_OTHER): Payer: BC Managed Care – PPO | Admitting: Clinical

## 2019-09-07 DIAGNOSIS — F331 Major depressive disorder, recurrent, moderate: Secondary | ICD-10-CM | POA: Diagnosis not present

## 2019-09-14 ENCOUNTER — Ambulatory Visit: Payer: BC Managed Care – PPO | Admitting: Clinical

## 2019-09-14 ENCOUNTER — Ambulatory Visit (INDEPENDENT_AMBULATORY_CARE_PROVIDER_SITE_OTHER): Payer: BC Managed Care – PPO | Admitting: Clinical

## 2019-09-14 DIAGNOSIS — F331 Major depressive disorder, recurrent, moderate: Secondary | ICD-10-CM | POA: Diagnosis not present

## 2019-09-16 ENCOUNTER — Other Ambulatory Visit: Payer: Self-pay | Admitting: Neurology

## 2019-09-16 MED ORDER — ONDANSETRON 8 MG PO TBDP: 8.0000 mg | ORAL_TABLET | Freq: Three times a day (TID) | ORAL | 3 refills | Status: DC | PRN

## 2019-09-16 MED ORDER — RIZATRIPTAN BENZOATE 10 MG PO TBDP: ORAL_TABLET | ORAL | 3 refills | Status: DC

## 2019-09-21 ENCOUNTER — Ambulatory Visit (INDEPENDENT_AMBULATORY_CARE_PROVIDER_SITE_OTHER): Payer: BC Managed Care – PPO | Admitting: Clinical

## 2019-09-21 DIAGNOSIS — F331 Major depressive disorder, recurrent, moderate: Secondary | ICD-10-CM

## 2019-09-28 ENCOUNTER — Ambulatory Visit (INDEPENDENT_AMBULATORY_CARE_PROVIDER_SITE_OTHER): Payer: BC Managed Care – PPO | Admitting: Clinical

## 2019-09-28 DIAGNOSIS — F331 Major depressive disorder, recurrent, moderate: Secondary | ICD-10-CM

## 2019-10-01 DIAGNOSIS — K5904 Chronic idiopathic constipation: Secondary | ICD-10-CM | POA: Diagnosis not present

## 2019-10-01 DIAGNOSIS — K219 Gastro-esophageal reflux disease without esophagitis: Secondary | ICD-10-CM | POA: Diagnosis not present

## 2019-10-01 DIAGNOSIS — R140 Abdominal distension (gaseous): Secondary | ICD-10-CM | POA: Diagnosis not present

## 2019-10-05 ENCOUNTER — Ambulatory Visit (INDEPENDENT_AMBULATORY_CARE_PROVIDER_SITE_OTHER): Payer: BC Managed Care – PPO | Admitting: Clinical

## 2019-10-05 DIAGNOSIS — F331 Major depressive disorder, recurrent, moderate: Secondary | ICD-10-CM | POA: Diagnosis not present

## 2019-10-12 ENCOUNTER — Ambulatory Visit (INDEPENDENT_AMBULATORY_CARE_PROVIDER_SITE_OTHER): Payer: BC Managed Care – PPO | Admitting: Clinical

## 2019-10-12 DIAGNOSIS — F331 Major depressive disorder, recurrent, moderate: Secondary | ICD-10-CM | POA: Diagnosis not present

## 2019-10-13 ENCOUNTER — Encounter: Payer: Self-pay | Admitting: *Deleted

## 2019-10-13 NOTE — Progress Notes (Addendum)
James Matthews (Key: [id number])  Your information has been submitted to [facility]. [employer] [place] will review the request and notify you of the determination decision directly, typically within 72 hours of receiving all information.  You will also receive your request decision electronically. To check for an update later, open this request again from your dashboard.  If [employer] has not responded within the specified timeframe or if you have any questions about your PA submission, contact [employer] [place] directly at [phone].  Form [employer] account executive Form (CB) Created 2 days ago Sent to Plan 2 days ago Plan Response 2 days ago Submit Clinical Questions 2 days ago Determination Favorable 22 hours ago Message from Plan Effective from 10/13/2019 through 10/11/2020.

## 2019-10-19 ENCOUNTER — Other Ambulatory Visit: Payer: Self-pay

## 2019-10-19 ENCOUNTER — Ambulatory Visit (INDEPENDENT_AMBULATORY_CARE_PROVIDER_SITE_OTHER): Payer: BC Managed Care – PPO | Admitting: Family Medicine

## 2019-10-19 ENCOUNTER — Ambulatory Visit (INDEPENDENT_AMBULATORY_CARE_PROVIDER_SITE_OTHER): Payer: BC Managed Care – PPO | Admitting: Clinical

## 2019-10-19 ENCOUNTER — Encounter: Payer: Self-pay | Admitting: Family Medicine

## 2019-10-19 VITALS — BP 124/72 | HR 102 | Temp 98.4°F | Ht 73.0 in | Wt 177.0 lb

## 2019-10-19 DIAGNOSIS — Q600 Renal agenesis, unilateral: Secondary | ICD-10-CM | POA: Diagnosis not present

## 2019-10-19 DIAGNOSIS — F331 Major depressive disorder, recurrent, moderate: Secondary | ICD-10-CM | POA: Diagnosis not present

## 2019-10-19 DIAGNOSIS — J4550 Severe persistent asthma, uncomplicated: Secondary | ICD-10-CM | POA: Diagnosis not present

## 2019-10-19 DIAGNOSIS — F411 Generalized anxiety disorder: Secondary | ICD-10-CM

## 2019-10-19 DIAGNOSIS — F4310 Post-traumatic stress disorder, unspecified: Secondary | ICD-10-CM

## 2019-10-19 MED ORDER — DESVENLAFAXINE SUCCINATE ER 100 MG PO TB24: 100.0000 mg | ORAL_TABLET | Freq: Every day | ORAL | 1 refills | Status: DC

## 2019-10-19 MED ORDER — CLONAZEPAM 1 MG PO TABS: 1.0000 mg | ORAL_TABLET | Freq: Three times a day (TID) | ORAL | 1 refills | Status: DC | PRN

## 2019-10-19 MED ORDER — BUSPIRONE HCL 30 MG PO TABS: 30.0000 mg | ORAL_TABLET | Freq: Two times a day (BID) | ORAL | 1 refills | Status: DC

## 2019-10-19 MED ORDER — TRELEGY ELLIPTA 200-62.5-25 MCG/INH IN AEPB: 1.0000 | INHALATION_SPRAY | Freq: Every day | RESPIRATORY_TRACT | 5 refills | Status: DC

## 2019-10-19 NOTE — Patient Instructions (Addendum)
Schedule earliest available fasting labs at the desk to come back for cortisol check  Stop symbicort after today Start trelegy We will call you within two weeks about your referral to allergist and psychiatrist in [place]. If you do not hear within 3 weeks, give [place] a call.   poor control of  Depression/GAD. We will do formal referral to psychiatry- he has tried to get into multiple offices without success. Also new diagnosis of PTSD doing some specialized trauma based CBT- seems to be helping some.   We also discussed possible EMDR- he will consider in future. Asked him to run this by Dr. Dewayne Hatch to see her thoughts on doing concurrently. Charisse Klinefelter Petrini with [email or web address] would be a resource for this.   Recommended follow up: Return for next already scheduled visit.

## 2019-10-19 NOTE — Progress Notes (Signed)
Phone [phone] In person visit   Subjective:   James Matthews is a 24 y.o. year old very pleasant male patient who presents for/with See problem oriented charting Chief Complaint  Patient presents with  . Asthma    This visit occurred during the SARS-CoV-2 public health emergency.  Safety protocols were in place, including screening questions prior to the visit, additional usage of staff PPE, and extensive cleaning of exam room while observing appropriate contact time as indicated for disinfecting solutions.   Past Medical History-  Patient Active Problem List   Diagnosis Date Noted  . Daily headache 06/02/2019    Priority: High  . Asthma, moderate persistent 05/25/2019    Priority: High  . Major depressive disorder, recurrent episode, moderate (HCC) 06/04/2016    Priority: High  . Verbal apraxia     Priority: High  . PTSD (post-traumatic stress disorder) 10/19/2019    Priority: Medium  . Posterior urethral valves (PUV) 06/04/2016    Priority: Medium  . Essential hypertension 06/04/2016    Priority: Medium  . Bladder spasms 06/04/2016    Priority: Medium  . GAD (generalized anxiety disorder) 06/04/2016    Priority: Medium  . CKD (chronic kidney disease), stage II 06/04/2016    Priority: Medium  . Mesenteric lymphadenopathy 07/27/2016    Priority: Low  . GERD (gastroesophageal reflux disease) 06/04/2016    Priority: Low  . Premature birth     Priority: Low  . IBS (irritable bowel syndrome)     Priority: Low  . Social anxiety disorder 05/04/2018  . Insomnia 05/04/2018    Medications- reviewed and updated Current Outpatient Medications  Medication Sig Dispense Refill  . busPIRone (BUSPAR) 30 MG tablet Take 1 tablet (30 mg total) by mouth 2 (two) times daily. 180 tablet 1  . clonazePAM (KLONOPIN) 1 MG tablet Take 1 tablet (1 mg total) by mouth 3 (three) times daily as needed for anxiety. 90 tablet 1  . desvenlafaxine (PRISTIQ) 100 MG 24 hr tablet Take 1  tablet (100 mg total) by mouth daily. 90 tablet 1  . Erenumab-aooe (AIMOVIG) 70 MG/ML SOAJ Inject 70 mg into the skin every 30 (thirty) days. 1 pen 11  . levalbuterol (XOPENEX HFA) 45 MCG/ACT inhaler Inhale 2 puffs into the lungs every 4 (four) hours as needed for wheezing. 1 Inhaler 12  . losartan (COZAAR) 100 MG tablet Take 1 tablet by mouth every evening.     . ondansetron (ZOFRAN ODT) 8 MG disintegrating tablet Take 1 tablet (8 mg total) by mouth every 8 (eight) hours as needed for nausea or vomiting. 20 tablet 3  . pantoprazole (PROTONIX) 40 MG tablet Take by mouth.    . rizatriptan (MAXALT-MLT) 10 MG disintegrating tablet Take 1 tablet earliest onset of migraine.  May repeat in 2 hours if needed.  Maximum 2 tablets in 24 hours. 10 tablet 3  . Fluticasone-Umeclidin-Vilant (TRELEGY ELLIPTA) 200-62.5-25 MCG/INH AEPB Inhale 1 Inhaler into the lungs daily. 28 each 5   No current facility-administered medications for this visit.     Objective:  BP 124/72   Pulse (!) 102   Temp 98.4 F (36.9 C) (Temporal)   Ht 6\' 1"  (1.854 m)   Wt 177 lb (80.3 kg)   SpO2 99%   BMI 23.35 kg/m  Gen: NAD, resting comfortably CV: RRR no murmurs rubs or gallops. Not tachycardic on repeat Lungs: CTAB no crackles, wheeze, rhonchi. Recently took xopenex though Abdomen: soft/nontender/nondistended/normal bowel sounds.  Ext: no edema Skin: warm, dry  Assessment and Plan   # Asthma  S:Patient Symbicort compliant twice daily max dose (increased last visit). He has been taking rescue inhaler at least twice a day for about five months (last visit he was down from 3x a day to once a day but symptoms have worsened). He has noticed a small improvement recently but has still had shortness of breath and chest pain at times. He has not been taking anything for allergies recently.   He did see an allergist September 01, 2019 with [facility] did not address his asthma A/P: with ongoing issues with asthma  Despite  max dose symbicort.  Considered change to breo but with using xopenex 2-3x a day we will get much more aggressive and go to trelegy and refer to allergist  # GAD/depression/ptsd S: He is compliant with Pristiq. Patient is currently taking Buspar 30 twice a day, Klonopin at least two to three times a day. He does make sure that he has at least 6 hours between doses. He feels like his symptoms have gotten worse in the last few weeks. He was recently diagnosed recently by therapist Dr. Dewayne Hatch with PTSD. He does see therapist once a week. They are doing CBT for trauma and that is helping.   Patient was tested for ADHD- had concentration issues but was thought to be anxiety issues and not ADHD with [place] attention specailist.    Depression screen [facility] 2/9 10/19/2019 06/02/2019  Decreased Interest 2 0  Down, Depressed, Hopeless 1 0  PHQ - 2 Score 3 0  Altered sleeping 1 2  Tired, decreased energy 3 2  Change in appetite 0 0  Feeling bad or failure about yourself  0 0  Trouble concentrating 3 3  Moving slowly or fidgety/restless 0 0  Suicidal thoughts 0 0  PHQ-9 Score 10 7  Difficult doing work/chores Not difficult at all Somewhat difficult  A/P: poor control of  Depression (he feels like its improving some) /GAD (he feels like this has been worse). We will do formal referral to psychiatry- he has tried to get into multiple offices without success. Also new diagnosis of PTSD doing some specialized trauma based CBT- seems to be helping some.  -refill clonazepam for 2 months worth at least (he may be able to stretch this longer)  We also discussed possible EMDR- he will consider in future. Asked him to run this by Dr. Dewayne Hatch to see her thoughts on doing concurrently. Charisse Klinefelter Petrini with [email or web address] would be a resource for this.    # wants to come back to have cortisol and cbc checked. Those labs are pending.   Recommended follow up: Return for next already scheduled  visit. Future Appointments  Date Time Provider Department Center  10/21/2019  6:00 PM Mendelson, Suzie Portela None  11/09/2019 12:00 PM Mendelson, Eileen Stanford [facility] None  11/17/2019  1:30 PM Drema Dallas, DO [facility] None  12/24/2019  4:20 PM Durene Cal Aldine Contes, MD [facility]   Lab/Order associations:   ICD-10-CM   1. Severe persistent asthma without complication  J45.50 Ambulatory referral to Allergy  2. GAD (generalized anxiety disorder)  F41.1 Ambulatory referral to Psychiatry  3. Major depressive disorder, recurrent episode, moderate (HCC)  F33.1 Ambulatory referral to Psychiatry  4. PTSD (post-traumatic stress disorder)  F43.10 Ambulatory referral to Psychiatry   Meds ordered this encounter  Medications  . Fluticasone-Umeclidin-Vilant (TRELEGY ELLIPTA) 200-62.5-25 MCG/INH AEPB    Sig: Inhale 1 Inhaler into the lungs daily.  Dispense:  28 each    Refill:  5  . clonazePAM (KLONOPIN) 1 MG tablet    Sig: Take 1 tablet (1 mg total) by mouth 3 (three) times daily as needed for anxiety.    Dispense:  90 tablet    Refill:  1  . busPIRone (BUSPAR) 30 MG tablet    Sig: Take 1 tablet (30 mg total) by mouth 2 (two) times daily.    Dispense:  180 tablet    Refill:  1  . desvenlafaxine (PRISTIQ) 100 MG 24 hr tablet    Sig: Take 1 tablet (100 mg total) by mouth daily.    Dispense:  90 tablet    Refill:  1   Return precautions advised.  Garret Reddish, MD

## 2019-10-21 ENCOUNTER — Encounter: Payer: Self-pay | Admitting: Family Medicine

## 2019-10-21 ENCOUNTER — Ambulatory Visit: Payer: BC Managed Care – PPO | Admitting: Clinical

## 2019-10-22 ENCOUNTER — Other Ambulatory Visit: Payer: Self-pay

## 2019-11-02 ENCOUNTER — Ambulatory Visit: Payer: BC Managed Care – PPO | Admitting: Clinical

## 2019-11-09 ENCOUNTER — Ambulatory Visit: Payer: BC Managed Care – PPO | Admitting: Clinical

## 2019-11-11 ENCOUNTER — Ambulatory Visit (INDEPENDENT_AMBULATORY_CARE_PROVIDER_SITE_OTHER): Payer: BC Managed Care – PPO | Admitting: Clinical

## 2019-11-11 DIAGNOSIS — F331 Major depressive disorder, recurrent, moderate: Secondary | ICD-10-CM | POA: Diagnosis not present

## 2019-11-12 ENCOUNTER — Encounter: Payer: Self-pay | Admitting: Family Medicine

## 2019-11-16 ENCOUNTER — Ambulatory Visit: Payer: BC Managed Care – PPO | Admitting: Clinical

## 2019-11-16 NOTE — Progress Notes (Signed)
NEUROLOGY FOLLOW UP OFFICE NOTE  James Matthews [id number]  HISTORY OF PRESENT ILLNESS: James Matthews is a 24 year old male with HTN and asthma who follows up for migraine.  He is accompanied by his mother who supplements history.  UPDATE: Started Aimovig in January. MRI of brain with and without contrast on 08/06/2019 personally reviewed was normal.  Intensity:  Moderate to severe Duration:  1 hour with rizatriptan Frequency:  2 to 3 days a week (once a week severe where he cannot function) Current NSAIDS:  none Current analgesics:  Tylenol (he was previously taking 3 to 4 days a week) Current triptans:  rizatriptan 10mg  Current ergotamine:  none Current anti-emetic:  Zofran ODT 8mg  Current muscle relaxants:  none Current anti-anxiolytic:  Clonazepam; busipirone Current sleep aide:  none Current Antihypertensive medications:  losartan Current Antidepressant medications:  Pristiq Current Anticonvulsant medications:  none Current anti-CGRP:  Aimovig 70mg  Current Vitamins/Herbal/Supplements:  none Current Antihistamines/Decongestants:  Astelin Other therapy:  none Hormone/birth control:  none  Caffeine:  1 cup coffee daily. Diet:  2 liters water daily.  No soda. Exercise:  Once a week Depression:  Yes but stable; Anxiety:  Yes but stable Other pain:  no Sleep hygiene:  8 hours of sleep a night.  Previously poor  HISTORY: He has history of migraines since childhood but have gotten worse about 2 months ago.  He has no explanation for increased frequency.  They are severe right worse than left parietal pressure-like headaches. They are associated with nausea, visual disturbance (red dots) photophobia and phonophobia.  They last from several hours to several days.  They have been occurring daily (previously occurring 3 to 4 times a week).  Triggers include light and sound.  Due to his speech disorder, prolonged talking may be a trigger.  Laying down to rest in dark and  quiet room helps. Prednisone taper was ineffective after it was finished.  Remote CT head without contrast from 02/19/2007 to evaluate headache was personally reviewed and demonstrated incidental findings consistent with right ethmoid sinusitis but no acute intracranial abnormality.  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  No known cause but possibly genetic.  He was born 3 months premature.   Past NSAIDS/steroids:  Prednisone. Unable to take NSAIDs due to renal disorder. Past analgesics:  none Past abortive triptans:  sumatriptan (ineffective) Past abortive ergotamine:  none Past muscle relaxants:  none Past anti-emetic:  Zofran ODT 4mg  Past antihypertensive medications:  lisinopril Past antidepressant/antipsychotic/mood medications:  Fluoxetine, mirtazepine, Seroquel, trazodone Past anticonvulsant medications:  Gabapentin 600mg  Past anti-CGRP:  nonw Past vitamins/Herbal/Supplements:  none Past antihistamines/decongestants:  none Other past therapies:  none   Family history of headache:  Mom (migraines); brother (migraines); sister (migraines)  PAST MEDICAL HISTORY: Past Medical History:  Diagnosis Date  . Hypertension   . IBS (irritable bowel syndrome)    probiotic  . Posterior urethral valves   . Premature birth    3 months early  . Renal disorder   . Verbal apraxia     MEDICATIONS: Current Outpatient Medications on File Prior to Visit  Medication Sig Dispense Refill  . busPIRone (BUSPAR) 30 MG tablet Take 1 tablet (30 mg total) by mouth 2 (two) times daily. 180 tablet 1  . clonazePAM (KLONOPIN) 1 MG tablet Take 1 tablet (1 mg total) by mouth 3 (three) times daily as needed for anxiety. 90 tablet 1  . desvenlafaxine (PRISTIQ) 100 MG 24 hr tablet Take 1 tablet (100 mg  total) by mouth daily. 90 tablet 1  . Erenumab-aooe (AIMOVIG) 70 MG/ML SOAJ Inject 70 mg into the skin every 30 (thirty) days. 1 pen 11  . Fluticasone-Umeclidin-Vilant (TRELEGY ELLIPTA)  200-62.5-25 MCG/INH AEPB Inhale 1 Inhaler into the lungs daily. 28 each 5  . levalbuterol (XOPENEX HFA) 45 MCG/ACT inhaler Inhale 2 puffs into the lungs every 4 (four) hours as needed for wheezing. 1 Inhaler 12  . losartan (COZAAR) 100 MG tablet Take 1 tablet by mouth every evening.     . ondansetron (ZOFRAN ODT) 8 MG disintegrating tablet Take 1 tablet (8 mg total) by mouth every 8 (eight) hours as needed for nausea or vomiting. 20 tablet 3  . pantoprazole (PROTONIX) 40 MG tablet Take by mouth.    . rizatriptan (MAXALT-MLT) 10 MG disintegrating tablet Take 1 tablet earliest onset of migraine.  May repeat in 2 hours if needed.  Maximum 2 tablets in 24 hours. 10 tablet 3   No current facility-administered medications on file prior to visit.    ALLERGIES: Allergies  Allergen Reactions  . Albuterol     High HR  . Chocolate     Face swelling/sounds like angioedema "I had to go to urgent care today due to an allergic reaction to a chocolate candy bar. My throat swelled up and was closing, my nasal passages were congested, and my face was hurting. They gave me a steroid shot at the clinic, and then they sent me home presidone to take for a week" 06/2019  . Lisinopril Other (See Comments)  . Other Other (See Comments)    Pain med? Unsure of medication, caused n/v    FAMILY HISTORY: Family History  Problem Relation Age of Onset  . Breast cancer Mother        survivor  . Hypertension Mother   . Hyperlipidemia Mother   . Other Father        pituitary tumor- gamma knife radiation  . Heart disease Maternal Grandfather   . Prostate cancer Maternal Grandfather        46s  . Melanoma Sister   . Prostate cancer Paternal Grandfather        70s   SOCIAL HISTORY: Social History   Socioeconomic History  . Marital status: Single    Spouse name: Not on file  . Number of children: Not on file  . Years of education: Not on file  . Highest education level: Not on file  Occupational History  .  Not on file  Tobacco Use  . Smoking status: Never Smoker  . Smokeless tobacco: Never Used  Substance and Sexual Activity  . Alcohol use: No  . Drug use: No  . Sexual activity: Not on file  Other Topics Concern  . Not on file  Social History Narrative   Single. Lives with mom and dad. His brother and wife and nephew also live with him. Brother and sister- healthy.       Full time at [employer]. Studying psychology with biology minor.    Thinking grad school for psychology- would plan for research over clinical   Right handed   Two story home   Does drink caffeine, one large coffee daily   Social Determinants of Health   Financial Resource Strain:   . Difficulty of Paying Living Expenses:   Food Insecurity:   . Worried About Programme researcher, broadcasting/film/video in the Last Year:   . Barista in the Last Year:   Transportation Needs:   .  Lack of Transportation (Medical):   Marland Kitchen Lack of Transportation (Non-Medical):   Physical Activity:   . Days of Exercise per Week:   . Minutes of Exercise per Session:   Stress:   . Feeling of Stress :   Social Connections:   . Frequency of Communication with Friends and Family:   . Frequency of Social Gatherings with Friends and Family:   . Attends Religious Services:   . Active Member of Clubs or Organizations:   . Attends Archivist Meetings:   Marland Kitchen Marital Status:   Intimate Partner Violence:   . Fear of Current or Ex-Partner:   . Emotionally Abused:   Marland Kitchen Physically Abused:   . Sexually Abused:     PHYSICAL EXAM: Blood pressure 116/77, resp. rate 20, height 6\' 1"  (1.854 m), weight 180 lb (81.6 kg). General: No acute distress.  Patient appears well-groomed.   Head:  Normocephalic/atraumatic Eyes:  Fundi examined but not visualized Neck: supple, no paraspinal tenderness, full range of motion Heart:  Regular rate and rhythm Lungs:  Clear to auscultation bilaterally Back: No paraspinal tenderness Neurological Exam: alert and  oriented to person, place, and time. Attention span and concentration intact, recent and remote memory intact, fund of knowledge intact.  Speech fluent and not dysarthric, language intact.  CN II-XII intact. Bulk and tone normal, muscle strength 5/5 throughout.  Sensation to light touch, temperature and vibration intact.  Deep tendon reflexes 2+ throughout, toes downgoing.  Finger to nose and heel to shin testing intact.  Gait normal, Romberg negative.  IMPRESSION: Migraine without aura, without status migrainosus, not intractable Verbal apraxia  PLAN: 1.  For preventative management, increase Aimovig to 140mg  2.  For abortive therapy, Maxalt MLT 10mg  with Zofran ODT 3.  Limit use of pain relievers to no more than 2 days out of week to prevent risk of rebound or medication-overuse headache. 4.  Keep headache diary 5.  Exercise, hydration, caffeine cessation, sleep hygiene, monitor for and avoid triggers 6. Follow up 4 months   Metta Clines, DO  CC: Garret Reddish, MD

## 2019-11-17 ENCOUNTER — Encounter: Payer: Self-pay | Admitting: Neurology

## 2019-11-17 ENCOUNTER — Ambulatory Visit: Payer: BC Managed Care – PPO | Admitting: Neurology

## 2019-11-17 ENCOUNTER — Other Ambulatory Visit: Payer: Self-pay

## 2019-11-17 VITALS — BP 116/77 | Resp 20 | Ht 73.0 in | Wt 180.0 lb

## 2019-11-17 DIAGNOSIS — G43709 Chronic migraine without aura, not intractable, without status migrainosus: Secondary | ICD-10-CM

## 2019-11-17 DIAGNOSIS — R482 Apraxia: Secondary | ICD-10-CM

## 2019-11-17 MED ORDER — AIMOVIG 140 MG/ML ~~LOC~~ SOAJ: 140.0000 mg | SUBCUTANEOUS | 11 refills | Status: DC

## 2019-11-17 NOTE — Patient Instructions (Signed)
Increase aimovig to 140mg  every 30 days Continue rizatriptan and ondansetron as needed for rescue therapy Limit use of pain relievers to no more than 2 days out of week to prevent risk of rebound or medication-overuse headache. Follow up in 4 months

## 2019-11-19 ENCOUNTER — Ambulatory Visit (INDEPENDENT_AMBULATORY_CARE_PROVIDER_SITE_OTHER): Payer: BC Managed Care – PPO | Admitting: Clinical

## 2019-11-19 DIAGNOSIS — F331 Major depressive disorder, recurrent, moderate: Secondary | ICD-10-CM

## 2019-11-23 ENCOUNTER — Ambulatory Visit (INDEPENDENT_AMBULATORY_CARE_PROVIDER_SITE_OTHER): Payer: BC Managed Care – PPO | Admitting: Clinical

## 2019-11-23 DIAGNOSIS — F331 Major depressive disorder, recurrent, moderate: Secondary | ICD-10-CM | POA: Diagnosis not present

## 2019-11-25 ENCOUNTER — Other Ambulatory Visit: Payer: Self-pay

## 2019-11-25 ENCOUNTER — Ambulatory Visit: Payer: BC Managed Care – PPO | Admitting: Allergy

## 2019-11-25 ENCOUNTER — Encounter: Payer: Self-pay | Admitting: Allergy

## 2019-11-25 VITALS — BP 112/72 | HR 84 | Temp 98.1°F | Resp 16 | Ht 73.0 in | Wt 181.4 lb

## 2019-11-25 DIAGNOSIS — T7800XA Anaphylactic reaction due to unspecified food, initial encounter: Secondary | ICD-10-CM | POA: Diagnosis not present

## 2019-11-25 DIAGNOSIS — J3089 Other allergic rhinitis: Secondary | ICD-10-CM

## 2019-11-25 DIAGNOSIS — H1013 Acute atopic conjunctivitis, bilateral: Secondary | ICD-10-CM

## 2019-11-25 DIAGNOSIS — J4550 Severe persistent asthma, uncomplicated: Secondary | ICD-10-CM | POA: Diagnosis not present

## 2019-11-25 MED ORDER — MONTELUKAST SODIUM 10 MG PO TABS: 10.0000 mg | ORAL_TABLET | Freq: Every day | ORAL | 5 refills | Status: DC

## 2019-11-25 MED ORDER — EPINEPHRINE 0.3 MG/0.3ML IJ SOAJ
0.3000 mg | Freq: Once | INTRAMUSCULAR | 1 refills | Status: AC
Start: 2019-11-25 — End: 2019-11-25

## 2019-11-25 NOTE — Progress Notes (Signed)
New Patient Note  RE: James Matthews MRN: [id number] DOB: 21-Nov-1995 Date of Office Visit: 11/25/2019  Referring provider: [name], MD Primary care provider: [name], MD  Chief Complaint: allergies and asthma  History of present illness: James Matthews is a 24 y.o. male presenting today for consultation for allergies and asthma.  He presents today with his mother.  He is a former pt of our practice and previously was seeing Dr. Shaune Leeks.   He wants to get his allergies checked again as he has been having bad allergy symptoms lately.  He states he is allergic to pollens.  Symptoms include a throat tightening feeling, dry mouth, nasal congestion, sneezing, watery eyes, headaches, ear popping, ear fullness.  Symptoms are year-round and worse in spring and fall.  Will take claritin as needed and does feel it helps somewhat.  Has used Flonase as well.   Mother reports he was on singulair as a child but symptoms improve and he came off of this.    He has a history of asthma diagnosed in childhood. Triggers include activity, colder weather.  Reports symptoms include shortness of breath, chest tightness,  He has needed to go to ED/UC for asthma exacerbations.  His PCP started him on Trelegy about 1.5 months and has needed to use albuterol 3-4 times a week.  He was on symbicort prior and was needing to use albuterol 2-3 times a day mostly throughout winter.     Mother states she recalls him being allergic to lima beans as a kid based on testing but can eat now without issue.  He states he had a reaction to a chocolate bar with different types of nut.  He states he eats peanut butter without issue thus he is not concerned about peanuts.  The reaction occurred day after Christmas 2020.  He states he felt like he couldn't breathe due to throat feeling tight, vomiting, cough, lightheaded.  Mother states his tongue did look swollen. He went to UC and received steroid  injection, prednisone, benadyl.  Has been avoiding tree nuts.  The chocolate bar was a Lily[employer] chocolate bar (ingredients contained milk, soy, tree nuts).  Eats dairy and soy milk without any issues.  Review of systems: Review of Systems  Constitutional: Negative.   HENT:       See HPI  Eyes:       See HPI  Respiratory:       See HPI  Cardiovascular: Negative.   Gastrointestinal: Negative.   Musculoskeletal: Negative.   Skin: Negative.   Neurological:       See HPI    All other systems negative unless noted above in HPI  Past medical history: Past Medical History:  Diagnosis Date  . Asthma   . Hypertension   . IBS (irritable bowel syndrome)    probiotic  . Posterior urethral valves   . Premature birth    3 months early  . Renal disorder   . Verbal apraxia     Past surgical history: Past Surgical History:  Procedure Laterality Date  . ADENOIDECTOMY    . ESOPHAGEAL MANOMETRY N/A 01/23/2016   Procedure: ESOPHAGEAL MANOMETRY (EM);  Surgeon: Juanita Craver, MD;  Location: [facility];  Service: Endoscopy;  Laterality: N/A;  . LASIK     eye surgery july 2017  . NEPHRECTOMY     rght   . TONSILLECTOMY      Family history:  Family History  Problem Relation Age of  Onset  . Breast cancer Mother        survivor  . Hypertension Mother   . Hyperlipidemia Mother   . Other Father        pituitary tumor- gamma knife radiation  . Heart disease Maternal Grandfather   . Prostate cancer Maternal Grandfather        1s  . Melanoma Sister   . Prostate cancer Paternal Grandfather        65s    Social history: He lives in a home with carpeting with electric heating and fan cooling.  There are dogs in the home.  There is no concern for water damage, mildew averages in the home.  He is an advocate.  He just graduated college with honors.  He denies a smoking history.  Medication List: Current Outpatient Medications  Medication Sig Dispense Refill  . busPIRone (BUSPAR) 30 MG  tablet Take 1 tablet (30 mg total) by mouth 2 (two) times daily. 180 tablet 1  . clonazePAM (KLONOPIN) 1 MG tablet Take 1 tablet (1 mg total) by mouth 3 (three) times daily as needed for anxiety. 90 tablet 1  . desvenlafaxine (PRISTIQ) 100 MG 24 hr tablet Take 1 tablet (100 mg total) by mouth daily. 90 tablet 1  . Erenumab-aooe (AIMOVIG) 140 MG/ML SOAJ Inject 140 mg into the skin every 30 (thirty) days. 1 pen 11  . Fluticasone-Umeclidin-Vilant (TRELEGY ELLIPTA) 200-62.5-25 MCG/INH AEPB Inhale 1 Inhaler into the lungs daily. 28 each 5  . levalbuterol (XOPENEX HFA) 45 MCG/ACT inhaler Inhale 2 puffs into the lungs every 4 (four) hours as needed for wheezing. 1 Inhaler 12  . losartan (COZAAR) 100 MG tablet Take 1 tablet by mouth every evening.     Marland Kitchen MOTEGRITY 2 MG TABS Take 1 tablet by mouth at bedtime.    . ondansetron (ZOFRAN ODT) 8 MG disintegrating tablet Take 1 tablet (8 mg total) by mouth every 8 (eight) hours as needed for nausea or vomiting. 20 tablet 3  . pantoprazole (PROTONIX) 40 MG tablet Take by mouth.    . rizatriptan (MAXALT-MLT) 10 MG disintegrating tablet Take 1 tablet earliest onset of migraine.  May repeat in 2 hours if needed.  Maximum 2 tablets in 24 hours. 10 tablet 3  . montelukast (SINGULAIR) 10 MG tablet Take 1 tablet (10 mg total) by mouth at bedtime. 30 tablet 5   No current facility-administered medications for this visit.    Known medication allergies: Allergies  Allergen Reactions  . Albuterol     High HR  . Chocolate     Face swelling/sounds like angioedema "I had to go to urgent care today due to an allergic reaction to a chocolate candy bar. My throat swelled up and was closing, my nasal passages were congested, and my face was hurting. They gave me a steroid shot at the clinic, and then they sent me home presidone to take for a week" 06/2019  . Lisinopril Other (See Comments)  . Other Other (See Comments)    Pain med? Unsure of medication, caused n/v      Physical examination: Blood pressure 112/72, pulse 84, temperature 98.1 F (36.7 C), temperature source Temporal, resp. rate 16, height 6\' 1"  (1.854 m), weight 181 lb 6.4 oz (82.3 kg), SpO2 98 %.  General: Alert, interactive, in no acute distress. HEENT: PERRLA, TMs pearly gray, turbinates moderately edematous without discharge, post-pharynx non erythematous. Neck: Supple without lymphadenopathy. Lungs: Clear to auscultation without wheezing, rhonchi or rales. {no increased work  of breathing. CV: Normal S1, S2 without murmurs. Abdomen: Nondistended, nontender. Skin: Warm and dry, without lesions or rashes. Extremities:  No clubbing, cyanosis or edema. Neuro:   Grossly intact.  Diagnositics/Labs:  Spirometry: FEV1: 4.26L 88%, FVC: 5.82L 98%, ratio consistent with Nonobstructive pattern  Allergy testing: Environmental allergy skin prick testing is positive to grass pollen, weed pollens, tree pollens, molds, dust mites, cat, mixed feathers, mouse. Select food allergy testing is positive to walnut, almond, hazelnut, coconut, pistachio. Allergy testing results were read and interpreted by provider, documented by clinical staff.   Assessment and plan:   Asthma, severe persistent  - not well controlled at this time  - have access to levalbuterol inhaler 2 puffs every 4-6 hours as needed for cough/wheeze/shortness of breath/chest tightness.  May use 15-20 minutes prior to activity.   Monitor frequency of use.    - continue Trelegy 1 puff daily  - start Singulair 10mg  daily at bedtime.    - discussed addition of a biologic asthma medication for improved asthma control.  We discussed the options including Xolair, Nucala, Fasenra and Dupixent.  Will need to obtain labwork (CBC to assess eosinophils and IgE level) to see if you are a candidate for these options.    Asthma control goals:   Full participation in all desired activities (may need albuterol before  activity)  Albuterol use two time or less a week on average (not counting use with activity)  Cough interfering with sleep two time or less a month  Oral steroids no more than once a year  No hospitalizations  Allergic rhinitis with conjunctivitis - environmental allergy testing is positive to grass pollens, weed pollens, tree pollens, molds, dust mite, cat, mouse, mixed feathers - allergen avoidance measures discussed/handouts provided - as above start Singulair - recommend long-acting antihistamine either Zyrtec 10mg , Allegra 180mg  and Xyzal 5mg  daily as needed.   - for watery eyes recommend use of Pataday 1 drop each eye daily as needed - for nasal congestion recommend use of nasal steroid spray like Flonase, Rhinocort or Nasacort 2 sprays each nostril daily as needed.  Best used for 1-2 weeks at a time for maximum effect - if medication management is not effective enough then consider allergen immunotherapy (allergy shots) however your asthma needs to be under good control before considering this option.  Anaphylaxis due to food  - Concern for tree nut allergy due to reaction following a chocolate bar with nuts.  I am not concerned about dairy, chocolate, peanut or soy allergy as you eat these foods without any issue.  - Skin testing to tree nuts is positive to walnut, almond, hazelnut, pistachio and coconut  - continue avoidance of   - have access to self-injectable epinephrine AuviQ 0.3mg  at all times  - follow emergency action plan in case of allergic reaction  Follow-up in 3-4 months or sooner if needed  I appreciate the opportunity to take part in James Matthews's care. Please do not hesitate to contact me with questions.  Sincerely,   , MD Allergy/Immunology [facility]

## 2019-11-25 NOTE — Patient Instructions (Addendum)
Asthma  - have access to levalbuterol inhaler 2 puffs every 4-6 hours as needed for cough/wheeze/shortness of breath/chest tightness.  May use 15-20 minutes prior to activity.   Monitor frequency of use.    - continue Trelegy 1 puff daily  - start Singulair 10mg  daily at bedtime.    - discussed addition of a biologic asthma medication for improved asthma control.  We discussed the options including Xolair, Nucala, Fasenra and Dupixent.  Will need to obtain labwork (CBC to assess eosinophils and IgE level) to see if you are a candidate for these options.    Asthma control goals:   Full participation in all desired activities (may need albuterol before activity)  Albuterol use two time or less a week on average (not counting use with activity)  Cough interfering with sleep two time or less a month  Oral steroids no more than once a year  No hospitalizations  Allergies - environmental allergy testing is positive to grass pollens, weed pollens, tree pollens, molds, dust mite, cat, mouse, mixed feathers - allergen avoidance measures discussed/handouts provided - as above start Singulair - recommend long-acting antihistamine either Zyrtec 10mg , Allegra 180mg  and Xyzal 5mg  daily as needed.   - for watery eyes recommend use of Pataday 1 drop each eye daily as needed - for nasal congestion recommend use of nasal steroid spray like Flonase, Rhinocort or Nasacort 2 sprays each nostril daily as needed.  Best used for 1-2 weeks at a time for maximum effect - if medication management is not effective enough then consider allergen immunotherapy (allergy shots) however your asthma needs to be under good control before considering this option.  Allergic reaction  - Concern for tree nut allergy due to reaction following a chocolate bar with nuts.  I am not concerned about dairy, chocolate, peanut or soy allergy as you eat these foods without any issue.  - Skin testing to tree nuts is positive to  walnut, almond, hazelnut, pistachio and coconut  - continue avoidance of   - have access to self-injectable epinephrine AuviQ 0.3mg  at all times  - follow emergency action plan in case of allergic reaction  Follow-up in 3-4 months or sooner if needed

## 2019-11-29 LAB — CBC WITH DIFFERENTIAL
Basophils Absolute: 0.1 10*3/uL (ref 0.0–0.2)
Basos: 1 %
EOS (ABSOLUTE): 0.2 10*3/uL (ref 0.0–0.4)
Eos: 3 %
Hematocrit: 44 % (ref 37.5–51.0)
Hemoglobin: 15.2 g/dL (ref 13.0–17.7)
Immature Grans (Abs): 0 10*3/uL (ref 0.0–0.1)
Immature Granulocytes: 1 %
Lymphocytes Absolute: 1.7 10*3/uL (ref 0.7–3.1)
Lymphs: 28 %
MCH: 32.3 pg (ref 26.6–33.0)
MCHC: 34.5 g/dL (ref 31.5–35.7)
MCV: 94 fL (ref 79–97)
Monocytes Absolute: 0.5 10*3/uL (ref 0.1–0.9)
Monocytes: 8 %
Neutrophils Absolute: 3.6 10*3/uL (ref 1.4–7.0)
Neutrophils: 59 %
RBC: 4.7 x10E6/uL (ref 4.14–5.80)
RDW: 11.6 % (ref 11.6–15.4)
WBC: 5.9 10*3/uL (ref 3.4–10.8)

## 2019-11-29 LAB — IGE: IgE (Immunoglobulin E), Serum: 44 IU/mL (ref 6–495)

## 2019-11-29 LAB — TRYPTASE: Tryptase: 7.3 ug/L (ref 2.2–13.2)

## 2019-11-30 ENCOUNTER — Ambulatory Visit (INDEPENDENT_AMBULATORY_CARE_PROVIDER_SITE_OTHER): Payer: BC Managed Care – PPO | Admitting: Clinical

## 2019-11-30 DIAGNOSIS — F331 Major depressive disorder, recurrent, moderate: Secondary | ICD-10-CM

## 2019-12-02 ENCOUNTER — Telehealth: Payer: Self-pay | Admitting: Allergy

## 2019-12-02 NOTE — Telephone Encounter (Signed)
PT called stating he would like to start Nucala at home per lab result notes from Dr Delorse Lek.

## 2019-12-03 NOTE — Telephone Encounter (Signed)
Spoke to patient and advised submit process and will submit for Nucala autoinjector per Ins

## 2019-12-03 NOTE — Telephone Encounter (Signed)
Called patient and advised approval process, submit to [facility] and copay card. Will reach out once ready to submit.  Gave intrux to patient on when Rx arrives to put in fridge and contact office for appt to start therapy (autoinjector as dictated by Ins) and we can show hoe to admin or he can chose to bring to office for admin

## 2019-12-08 ENCOUNTER — Telehealth: Payer: Self-pay | Admitting: Family Medicine

## 2019-12-08 ENCOUNTER — Telehealth: Payer: Self-pay | Admitting: Allergy & Immunology

## 2019-12-08 MED ORDER — PREDNISONE 10 MG PO TABS: 30.0000 mg | ORAL_TABLET | Freq: Two times a day (BID) | ORAL | 0 refills | Status: AC

## 2019-12-08 NOTE — Telephone Encounter (Signed)
FYI do not see note from ED yet.

## 2019-12-08 NOTE — Telephone Encounter (Signed)
Nurse Assessment Nurse: Orinda Kenner Date/Time Lamount Cohen Time): 12/07/2019 10:13:34 AM Confirm and document reason for call. If symptomatic, describe symptoms. ---Caller states he has asthma and is having difficulty breathing. He states he has been having symptoms for 4 days. He has Eosinophilic asthma. He uses inhaler currently. Has the patient had close contact with a person known or suspected to have the novel coronavirus illness OR traveled / lives in area with major community spread (including international travel) in the last 14 days from the onset of symptoms? * If Asymptomatic, screen for exposure and travel within the last 14 days. ---No Does the patient have any new or worsening symptoms? ---Yes Will a triage be completed? ---Yes Related visit to physician within the last 2 weeks? ---Yes Does the PT have any chronic conditions? (i.e. diabetes, asthma, this includes High risk factors for pregnancy, etc.) ---Yes List chronic conditions. ---Asthma, CKD Is this a behavioral health or substance abuse call? ---No Guidelines Guideline Title Affirmed Question Affirmed Notes Nurse Date/Time (Eastern Time) Asthma Attack Chest pain Orinda Kenner 12/07/2019 10:15:44 AM Disp. Time Lamount Cohen Time) Disposition Final User 12/07/2019 10:10:28 AM Send to Urgent Leona Carry, JessicaPLEASE NOTE: All timestamps contained within this report are represented as [place] Standard Time. CONFIDENTIALTY NOTICE: This fax transmission is intended only for the addressee. It contains information that is legally privileged, confidential or otherwise protected from use or disclosure. If you are not the intended recipient, you are strictly prohibited from reviewing, disclosing, copying using or disseminating any of this information or taking any action in reliance on or regarding this information. If you have received this fax in error, please notify us immediately by telephone so that we can arrange for its  return to [place]. Phone: [phone], Toll-Free: [phone], Fax: [phone] Page: 2 of 2 Call Id: [id number] 12/07/2019 10:19:24 AM Go to ED Now Yes Zigmund Gottron Disagree/Comply Comply Caller Understands Yes PreDisposition Call Doctor Care Advice Given Per Guideline GO TO ED NOW: * You need to be seen in the Emergency Department. * Go to the ED at ___________ Hospital. * Another adult should drive. ASTHMA QUICK-RELIEF INHALER (E.G., ALBUTEROL, SALBUTAMOL, XOPENEX): * Take 4 puffs on your quick-relief inhaler right now. BRING MEDICINES:

## 2019-12-08 NOTE — Telephone Encounter (Signed)
I called the patient to check on him, per Beth's request.  He tells me that he is using his albuterol every 2 hours with improvement in his symptoms.  His last prednisone was relatively recently and he tells me that when he is like this in the past, prednisone helps a lot.  He is able to make full sentences, although it does take a while to get a sentence out due to his history of verbal apraxia.  However, I did not hear him coughing or wheezing over the phone.  Just to be on the safe side, I did send in a prednisone burst of 30 mg twice a day for 5 days.  I told him that Dr. Delorse Lek might have a different plan after she evaluates him tomorrow morning and he was fine with that.  I confirmed his pharmacy and he will call us with any further questions or concerns.  Malachi Bonds, MD [facility]

## 2019-12-08 NOTE — Telephone Encounter (Signed)
Looks like he created a plane with his allergist and that they will be seeing him tomorrow

## 2019-12-09 ENCOUNTER — Encounter: Payer: Self-pay | Admitting: Allergy

## 2019-12-09 ENCOUNTER — Ambulatory Visit: Payer: BC Managed Care – PPO | Admitting: Allergy

## 2019-12-09 ENCOUNTER — Other Ambulatory Visit: Payer: Self-pay

## 2019-12-09 VITALS — BP 124/82 | HR 95 | Temp 97.1°F | Resp 16

## 2019-12-09 DIAGNOSIS — T7800XD Anaphylactic reaction due to unspecified food, subsequent encounter: Secondary | ICD-10-CM

## 2019-12-09 DIAGNOSIS — J4550 Severe persistent asthma, uncomplicated: Secondary | ICD-10-CM | POA: Diagnosis not present

## 2019-12-09 DIAGNOSIS — H1013 Acute atopic conjunctivitis, bilateral: Secondary | ICD-10-CM | POA: Diagnosis not present

## 2019-12-09 DIAGNOSIS — T7800XA Anaphylactic reaction due to unspecified food, initial encounter: Secondary | ICD-10-CM

## 2019-12-09 DIAGNOSIS — J4551 Severe persistent asthma with (acute) exacerbation: Secondary | ICD-10-CM | POA: Diagnosis not present

## 2019-12-09 DIAGNOSIS — J3089 Other allergic rhinitis: Secondary | ICD-10-CM | POA: Diagnosis not present

## 2019-12-09 MED ORDER — MEPOLIZUMAB 100 MG ~~LOC~~ SOLR
100.0000 mg | SUBCUTANEOUS | Status: DC
  Administered 2019-12-09 – 2020-01-12 (×2): 100 mg via SUBCUTANEOUS

## 2019-12-09 NOTE — Progress Notes (Signed)
Follow-up Note  RE: James Matthews MRN: [id number] DOB: 1995-09-20 Date of Office Visit: 12/09/2019   History of present illness: James Zadin Lange is a 24 y.o. male presenting today for sick visit for asthma exacerbation.  He was last seen in the office on 11/25/2019 by myself.  He presents today with his mother.  Started to flare up in the middle of last week.  He believes it may have been triggered due to changes in the temperature.  He has been having chest tightness, shortness of breath mostly when he gets up and doing activities.  Using albuterol every 2-3 hours.   Used albuterol last last night.  He states with the change to Trelegy that he did note less use of his albuterol inhaler up until this week.  He is also taking Singulair as well.  He did call our [facility] doctor yesterday who did send in a prescription for prednisone 30 mg twice a day that he started last night.  He states he has heard from [place] in was approved for new Calla however he has not received a supply yet.   Review of systems: Review of Systems  Constitutional: Negative.   HENT: Negative.   Eyes: Negative.   Respiratory:       See HPI  Cardiovascular: Negative.   Gastrointestinal: Negative.   Musculoskeletal: Negative.   Skin: Negative.   Neurological: Negative.     All other systems negative unless noted above in HPI  Past medical/social/surgical/family history have been reviewed and are unchanged unless specifically indicated below.  No changes  Medication List: Current Outpatient Medications  Medication Sig Dispense Refill  . busPIRone (BUSPAR) 30 MG tablet Take 1 tablet (30 mg total) by mouth 2 (two) times daily. 180 tablet 1  . clonazePAM (KLONOPIN) 1 MG tablet Take 1 tablet (1 mg total) by mouth 3 (three) times daily as needed for anxiety. 90 tablet 1  . desvenlafaxine (PRISTIQ) 100 MG 24 hr tablet Take 1 tablet (100 mg total) by mouth daily. 90  tablet 1  . Erenumab-aooe (AIMOVIG) 140 MG/ML SOAJ Inject 140 mg into the skin every 30 (thirty) days. 1 pen 11  . Fluticasone-Umeclidin-Vilant (TRELEGY ELLIPTA) 200-62.5-25 MCG/INH AEPB Inhale 1 Inhaler into the lungs daily. 28 each 5  . levalbuterol (XOPENEX HFA) 45 MCG/ACT inhaler Inhale 2 puffs into the lungs every 4 (four) hours as needed for wheezing. 1 Inhaler 12  . losartan (COZAAR) 100 MG tablet Take 1 tablet by mouth every evening.     . montelukast (SINGULAIR) 10 MG tablet Take 1 tablet (10 mg total) by mouth at bedtime. 30 tablet 5  . MOTEGRITY 2 MG TABS Take 1 tablet by mouth at bedtime.    . ondansetron (ZOFRAN ODT) 8 MG disintegrating tablet Take 1 tablet (8 mg total) by mouth every 8 (eight) hours as needed for nausea or vomiting. 20 tablet 3  . pantoprazole (PROTONIX) 40 MG tablet Take by mouth.    . predniSONE (DELTASONE) 10 MG tablet Take 3 tablets (30 mg total) by mouth 2 (two) times daily with a meal for 5 days. 30 tablet 0  . rizatriptan (MAXALT-MLT) 10 MG disintegrating tablet Take 1 tablet earliest onset of migraine.  May repeat in 2 hours if needed.  Maximum 2 tablets in 24 hours. 10 tablet 3   Current Facility-Administered Medications  Medication Dose Route Frequency Provider Last Rate Last Admin  . Mepolizumab SOLR 100 mg  100 mg Subcutaneous Q28  days Marcelyn Bruins, MD   100 mg at 12/09/19 1059     Known medication allergies: Allergies  Allergen Reactions  . Albuterol     High HR  . Chocolate     Face swelling/sounds like angioedema "I had to go to urgent care today due to an allergic reaction to a chocolate candy bar. My throat swelled up and was closing, my nasal passages were congested, and my face was hurting. They gave me a steroid shot at the clinic, and then they sent me home presidone to take for a week" 06/2019  . Lisinopril Other (See Comments)  . Other Other (See Comments)    Pain med? Unsure of medication, caused n/v     Physical  examination: Blood pressure 124/82, pulse 95, temperature (!) 97.1 F (36.2 C), temperature source Temporal, resp. rate 16, SpO2 98 %.  General: Alert, interactive, in no acute distress. HEENT: PERRLA, TMs pearly gray, turbinates non-edematous without discharge, post-pharynx non erythematous. Neck: Supple without lymphadenopathy. Lungs: Mildly decreased breath sounds with expiratory wheezing bilaterally. {no increased work of breathing.  Wheeze resolved after Xopenex use CV: Normal S1, S2 without murmurs. Abdomen: Nondistended, nontender. Skin: Warm and dry, without lesions or rashes. Extremities:  No clubbing, cyanosis or edema. Neuro:   Grossly intact.  Diagnositics/Labs:  Spirometry: FEV1: 3.72 L 75%, FVC: 5.43 L 90% predicted.  Status post 4 puffs of albuterol he did have an improvement in FEV1 to 4.95 L or 100% which was a 33% increase which is very significant.  Nucala injection started today with a hour wait time and he tolerated this well.  Assessment and plan:   Asthma with current exacerbation  - have access to levalbuterol inhaler 2 puffs every 4-6 hours as needed for cough/wheeze/shortness of breath/chest tightness.  May use 15-20 minutes prior to activity.   Monitor frequency of use.    - continue Trelegy 1 puff daily  - continue Singulair 10mg  daily at bedtime.    - CBC did show eosinophils to suggest you have eosinophilic type asthma.  Nucala injection started today in office.  Next dose in 1 month and continue monthly.   When your supple gets in bring to office for training for home use.    - complete prednisone pack 30mg  twice a day for 5 day duration  Asthma control goals:   Full participation in all desired activities (may need albuterol before activity)  Albuterol use two time or less a week on average (not counting use with activity)  Cough interfering with sleep two time or less a month  Oral steroids no more than once a year  No  hospitalizations  Allergic rhinitis with conjunctivitis - continue avoidance measures for grass pollens, weed pollens, tree pollens, molds, dust mite, cat, mouse, mixed feathers -  Singulair as above  - recommend long-acting antihistamine either Zyrtec 10mg , Allegra 180mg  and Xyzal 5mg  daily as needed.   - for watery eyes recommend use of Pataday 1 drop each eye daily as needed - for nasal congestion recommend use of nasal steroid spray like Flonase, Rhinocort or Nasacort 2 sprays each nostril daily as needed.  Best used for 1-2 weeks at a time for maximum effect - if medication management is not effective enough then consider allergen immunotherapy (allergy shots) however your asthma needs to be under good control before considering this option.  Anaphylaxis due to food  - Skin testing to tree nuts was positive to walnut, almond, hazelnut, pistachio and coconut  -  continue avoidance of tree nuts  - have access to self-injectable epinephrine AuviQ 0.3mg  at all times  - follow emergency action plan in case of allergic reaction  Follow-up in 3-4 months or sooner if needed  I appreciate the opportunity to take part in Wilbert's care. Please do not hesitate to contact me with questions.  Sincerely,   Margo Aye, MD Allergy/Immunology [facility]

## 2019-12-09 NOTE — Patient Instructions (Signed)
Asthma with current exacerbation  - have access to levalbuterol inhaler 2 puffs every 4-6 hours as needed for cough/wheeze/shortness of breath/chest tightness.  May use 15-20 minutes prior to activity.   Monitor frequency of use.    - continue Trelegy 1 puff daily  - continue Singulair 10mg  daily at bedtime.    - CBC did show eosinophils to suggest you have eosinophilic type asthma.  Nucala injection started today in office.  Next dose in 1 month and continue monthly.   When your supple gets in bring to office for training for home use.    - complete prednisone pack 30mg  twice a day for 5 day duration  Asthma control goals:   Full participation in all desired activities (may need albuterol before activity)  Albuterol use two time or less a week on average (not counting use with activity)  Cough interfering with sleep two time or less a month  Oral steroids no more than once a year  No hospitalizations  Allergies - continue avoidance measures for grass pollens, weed pollens, tree pollens, molds, dust mite, cat, mouse, mixed feathers -  Singulair as above  - recommend long-acting antihistamine either Zyrtec 10mg , Allegra 180mg  and Xyzal 5mg  daily as needed.   - for watery eyes recommend use of Pataday 1 drop each eye daily as needed - for nasal congestion recommend use of nasal steroid spray like Flonase, Rhinocort or Nasacort 2 sprays each nostril daily as needed.  Best used for 1-2 weeks at a time for maximum effect - if medication management is not effective enough then consider allergen immunotherapy (allergy shots) however your asthma needs to be under good control before considering this option.  Allergic reaction  - Skin testing to tree nuts was positive to walnut, almond, hazelnut, pistachio and coconut  - continue avoidance of tree nuts  - have access to self-injectable epinephrine AuviQ 0.3mg  at all times  - follow emergency action plan in case of allergic  reaction  Follow-up in 3-4 months or sooner if needed

## 2019-12-14 ENCOUNTER — Ambulatory Visit (INDEPENDENT_AMBULATORY_CARE_PROVIDER_SITE_OTHER): Payer: BC Managed Care – PPO | Admitting: Clinical

## 2019-12-14 DIAGNOSIS — F331 Major depressive disorder, recurrent, moderate: Secondary | ICD-10-CM | POA: Diagnosis not present

## 2019-12-16 ENCOUNTER — Other Ambulatory Visit: Payer: Self-pay | Admitting: Neurology

## 2019-12-21 ENCOUNTER — Ambulatory Visit (INDEPENDENT_AMBULATORY_CARE_PROVIDER_SITE_OTHER): Payer: BC Managed Care – PPO | Admitting: Clinical

## 2019-12-21 DIAGNOSIS — F331 Major depressive disorder, recurrent, moderate: Secondary | ICD-10-CM

## 2019-12-24 ENCOUNTER — Telehealth (INDEPENDENT_AMBULATORY_CARE_PROVIDER_SITE_OTHER): Payer: BC Managed Care – PPO | Admitting: Family Medicine

## 2019-12-24 ENCOUNTER — Encounter: Payer: Self-pay | Admitting: Family Medicine

## 2019-12-24 VITALS — Temp 97.8°F | Ht 73.0 in | Wt 178.0 lb

## 2019-12-24 DIAGNOSIS — F3341 Major depressive disorder, recurrent, in partial remission: Secondary | ICD-10-CM

## 2019-12-24 DIAGNOSIS — F4310 Post-traumatic stress disorder, unspecified: Secondary | ICD-10-CM | POA: Diagnosis not present

## 2019-12-24 DIAGNOSIS — J4550 Severe persistent asthma, uncomplicated: Secondary | ICD-10-CM

## 2019-12-24 DIAGNOSIS — F411 Generalized anxiety disorder: Secondary | ICD-10-CM

## 2019-12-24 MED ORDER — CLONAZEPAM 1 MG PO TABS: 1.0000 mg | ORAL_TABLET | Freq: Three times a day (TID) | ORAL | 1 refills | Status: DC | PRN

## 2019-12-24 NOTE — Progress Notes (Addendum)
Phone [phone] Virtual visit via Video note   Subjective:  Chief complaint: Chief Complaint  Patient presents with  . virtual  . Follow-up  . Asthma   This visit type was conducted due to national recommendations for restrictions regarding the COVID-19 Pandemic (e.g. social distancing).  This format is felt to be most appropriate for this patient at this time balancing risks to patient and risks to population by having him in for in person visit.  No physical exam was performed (except for noted visual exam or audio findings with Telehealth visits).    Our team/I connected with James Matthews at  4:20 PM EDT by a video enabled telemedicine application (doxy.me or caregility through epic) and verified that I am speaking with the correct person using two identifiers.  Location patient: [place] Location provider: [facility], office Persons participating in the virtual visit:  patient  Our team/I discussed the limitations of evaluation and management by telemedicine and the availability of in person appointments. In light of current covid-19 pandemic, patient also understands that we are trying to protect them by minimizing in office contact if at all possible.  The patient expressed consent for telemedicine visit and agreed to proceed. Patient understands insurance will be billed.   Past Medical History-  Patient Active Problem List   Diagnosis Date Noted  . Daily headache 06/02/2019    Priority: High  . Asthma, severe persistent 05/25/2019    Priority: High  . Major depression, recurrent ([place]) 06/04/2016    Priority: High  . Verbal apraxia     Priority: High  . PTSD (post-traumatic stress disorder) 10/19/2019    Priority: Medium  . Posterior urethral valves (PUV) 06/04/2016    Priority: Medium  . Essential hypertension 06/04/2016    Priority: Medium  . Bladder spasms 06/04/2016    Priority: Medium  . GAD (generalized anxiety disorder) 06/04/2016    Priority:  Medium  . CKD (chronic kidney disease), stage II 06/04/2016    Priority: Medium  . Mesenteric lymphadenopathy 07/27/2016    Priority: Low  . GERD (gastroesophageal reflux disease) 06/04/2016    Priority: Low  . Premature birth     Priority: Low  . IBS (irritable bowel syndrome)     Priority: Low  . Social anxiety disorder 05/04/2018  . Insomnia 05/04/2018    Medications- reviewed and updated Current Outpatient Medications  Medication Sig Dispense Refill  . busPIRone (BUSPAR) 30 MG tablet Take 1 tablet (30 mg total) by mouth 2 (two) times daily. 180 tablet 1  . clonazePAM (KLONOPIN) 1 MG tablet Take 1 tablet (1 mg total) by mouth 3 (three) times daily as needed for anxiety. 90 tablet 1  . desvenlafaxine (PRISTIQ) 100 MG 24 hr tablet Take 1 tablet (100 mg total) by mouth daily. 90 tablet 1  . Erenumab-aooe (AIMOVIG) 140 MG/ML SOAJ Inject 140 mg into the skin every 30 (thirty) days. 1 pen 11  . Fluticasone-Umeclidin-Vilant (TRELEGY ELLIPTA) 200-62.5-25 MCG/INH AEPB Inhale 1 Inhaler into the lungs daily. 28 each 5  . levalbuterol (XOPENEX HFA) 45 MCG/ACT inhaler Inhale 2 puffs into the lungs every 4 (four) hours as needed for wheezing. 1 Inhaler 12  . losartan (COZAAR) 100 MG tablet Take 1 tablet by mouth every evening.     . montelukast (SINGULAIR) 10 MG tablet Take 1 tablet (10 mg total) by mouth at bedtime. 30 tablet 5  . MOTEGRITY 2 MG TABS Take 1 tablet by mouth at bedtime.    . ondansetron (ZOFRAN-ODT)  8 MG disintegrating tablet TAKE 1 TABLET (8 MG TOTAL) BY MOUTH EVERY 8 (EIGHT) HOURS AS NEEDED FOR NAUSEA OR VOMITING. 20 tablet 3  . pantoprazole (PROTONIX) 40 MG tablet Take by mouth.    . rizatriptan (MAXALT-MLT) 10 MG disintegrating tablet Take 1 tablet earliest onset of migraine.  May repeat in 2 hours if needed.  Maximum 2 tablets in 24 hours. 10 tablet 3   Current Facility-Administered Medications  Medication Dose Route Frequency Provider Last Rate Last Admin  . Mepolizumab  SOLR 100 mg  100 mg Subcutaneous Q28 days Marcelyn Bruins, MD   100 mg at 12/09/19 1059     Objective:  Temp 97.8 F (36.6 C)   Ht 6\' 1"  (1.854 m)   Wt 178 lb (80.7 kg)   BMI 23.48 kg/m  self reported vitals Gen: NAD, resting comfortably Lungs: nonlabored, normal respiratory rate  Skin: appears dry, no obvious rash     Assessment and Plan   # Asthma- eosinophilic and now on Nucala monghylstarting 2021 S: Maintenance Medication: Trelegy 200 mcg 1 puff, singulair 10mg  at bedtime As needed medication: albuterol.  -did require a course of prednisone recently A/P: Seems to be improving.  Seems to be approaching goal of using albuterol twice a week or less.  Unfortunately asthma is still affecting his exercise-only feels like he can do about 10 minutes.  Recommended continued follow-up with allergist and continue Nucala injections with hope for improvement  # migraines- aimovig was increased to 140mg  dose. Not sure yet if its making a big difference. Rescue meds 1-2x a week- occasionally able to do once a week so may be getting better.   # Anxiety/Depression  S: Unfortunately even with psychiatry referral from this office patient has not been able to get into psychiatry  Medication: pristiq,  Klonopin 1Mg  up to 3x daily, Buspar 30MG  BID  Has been able to do reasonably well with mood lately. On a bit of a high from his book doing well. Anxiety slightly worse in last month. Some issues with sleep with racing thoughts. Has noted more irritability.   Counseling: Diagnosed as PTSD by psychology Dr. 2022. Doing some trauma based CBT.  -We have discussed doing EMDR concurrent but unfortunately he is not sure if insurance will cover this-he is looking into this at present  A/P: Patient reports improved but imperfect control of depression (will call partial remission) but unfortunately GAD/anxiety is not well controlled -He has been using clonazepam as needed but he is going to  try to schedule it 3 times a day-when he wakes up and then 30 minutes before bed and then 1 dose in between -He feels like alprazolam is more effective we discussed this medication also has more addictive potential and I generally would not transition to that without psychiatrist guidance -We reviewed multiple other medications that we could try-gabapentin affected mood negatively, thinks he also took hydroxyzine, remeron was not helpful -Also talked about atypical antipsychotics Seroquel the patient does not want to take that step at present and I told him typically I would want psychiatry involved as well -Try to exercise 10 minutes daily and increase if asthma allows   Coldness S: pt states he tends to get really cold even when its hot outside for 2 months. Face paleness at times for last few months. Asthma limiting him with exercise.  A/P: CBC normal last month.  TSH normal in November.  Offered repeat of these but he wants to hold off for  now. -I do think exercising regularly may help with blood flow -Also recommended remaining well-hydrated with 60 ounces of water per day  Recommended follow up: 2 months would be reasonable when he would be running out of refill of clonazepam which was ordered today Future Appointments  Date Time Provider Department Center  12/28/2019 12:00 PM Mendelson, Suzie Portela None   Lab/Order associations:   ICD-10-CM   1. Recurrent major depressive disorder, in full remission (HCC)  F33.42   2. GAD (generalized anxiety disorder)  F41.1   3. PTSD (post-traumatic stress disorder)  F43.10   4. Severe persistent asthma without complication  J45.50     Meds ordered this encounter  Medications  . clonazePAM (KLONOPIN) 1 MG tablet    Sig: Take 1 tablet (1 mg total) by mouth 3 (three) times daily as needed for anxiety.    Dispense:  90 tablet    Refill:  1   Time Spent: 42 minutes of total time (4:45 PM- 5:27 PM) was spent on the date of the encounter  performing the following actions: chart review prior to seeing the patient, obtaining history, performing a medically necessary exam, counseling on the treatment plan, placing orders, and documenting in our EHR.   Return precautions advised.  Tana Conch, MD

## 2019-12-27 ENCOUNTER — Encounter: Payer: Self-pay | Admitting: Family Medicine

## 2019-12-28 ENCOUNTER — Ambulatory Visit (INDEPENDENT_AMBULATORY_CARE_PROVIDER_SITE_OTHER): Payer: BC Managed Care – PPO | Admitting: Clinical

## 2019-12-28 DIAGNOSIS — F331 Major depressive disorder, recurrent, moderate: Secondary | ICD-10-CM

## 2020-01-01 DIAGNOSIS — F419 Anxiety disorder, unspecified: Secondary | ICD-10-CM | POA: Diagnosis not present

## 2020-01-01 DIAGNOSIS — N1330 Unspecified hydronephrosis: Secondary | ICD-10-CM | POA: Diagnosis not present

## 2020-01-01 DIAGNOSIS — R300 Dysuria: Secondary | ICD-10-CM | POA: Diagnosis not present

## 2020-01-01 DIAGNOSIS — N3090 Cystitis, unspecified without hematuria: Secondary | ICD-10-CM | POA: Diagnosis not present

## 2020-01-01 DIAGNOSIS — N179 Acute kidney failure, unspecified: Secondary | ICD-10-CM | POA: Diagnosis not present

## 2020-01-01 DIAGNOSIS — I959 Hypotension, unspecified: Secondary | ICD-10-CM | POA: Diagnosis not present

## 2020-01-01 DIAGNOSIS — Z905 Acquired absence of kidney: Secondary | ICD-10-CM | POA: Diagnosis not present

## 2020-01-01 DIAGNOSIS — N183 Chronic kidney disease, stage 3 unspecified: Secondary | ICD-10-CM | POA: Diagnosis not present

## 2020-01-01 DIAGNOSIS — R519 Headache, unspecified: Secondary | ICD-10-CM | POA: Diagnosis not present

## 2020-01-01 DIAGNOSIS — Z8249 Family history of ischemic heart disease and other diseases of the circulatory system: Secondary | ICD-10-CM | POA: Diagnosis not present

## 2020-01-01 DIAGNOSIS — R509 Fever, unspecified: Secondary | ICD-10-CM | POA: Diagnosis not present

## 2020-01-01 DIAGNOSIS — N10 Acute tubulo-interstitial nephritis: Secondary | ICD-10-CM | POA: Diagnosis not present

## 2020-01-01 DIAGNOSIS — R000 Tachycardia, unspecified: Secondary | ICD-10-CM | POA: Diagnosis not present

## 2020-01-01 DIAGNOSIS — R109 Unspecified abdominal pain: Secondary | ICD-10-CM | POA: Diagnosis not present

## 2020-01-01 DIAGNOSIS — F329 Major depressive disorder, single episode, unspecified: Secondary | ICD-10-CM | POA: Diagnosis not present

## 2020-01-01 DIAGNOSIS — G43909 Migraine, unspecified, not intractable, without status migrainosus: Secondary | ICD-10-CM | POA: Diagnosis not present

## 2020-01-01 DIAGNOSIS — N136 Pyonephrosis: Secondary | ICD-10-CM | POA: Diagnosis not present

## 2020-01-01 DIAGNOSIS — N390 Urinary tract infection, site not specified: Secondary | ICD-10-CM | POA: Diagnosis not present

## 2020-01-01 DIAGNOSIS — J45909 Unspecified asthma, uncomplicated: Secondary | ICD-10-CM | POA: Diagnosis not present

## 2020-01-01 DIAGNOSIS — R10814 Left lower quadrant abdominal tenderness: Secondary | ICD-10-CM | POA: Diagnosis not present

## 2020-01-01 DIAGNOSIS — R0602 Shortness of breath: Secondary | ICD-10-CM | POA: Diagnosis not present

## 2020-01-01 DIAGNOSIS — Z66 Do not resuscitate: Secondary | ICD-10-CM | POA: Diagnosis not present

## 2020-01-01 DIAGNOSIS — R309 Painful micturition, unspecified: Secondary | ICD-10-CM | POA: Diagnosis not present

## 2020-01-01 DIAGNOSIS — A4189 Other specified sepsis: Secondary | ICD-10-CM | POA: Diagnosis not present

## 2020-01-01 DIAGNOSIS — N189 Chronic kidney disease, unspecified: Secondary | ICD-10-CM | POA: Diagnosis not present

## 2020-01-01 DIAGNOSIS — B9620 Unspecified Escherichia coli [E. coli] as the cause of diseases classified elsewhere: Secondary | ICD-10-CM | POA: Diagnosis not present

## 2020-01-01 DIAGNOSIS — I129 Hypertensive chronic kidney disease with stage 1 through stage 4 chronic kidney disease, or unspecified chronic kidney disease: Secondary | ICD-10-CM | POA: Diagnosis not present

## 2020-01-01 DIAGNOSIS — R6520 Severe sepsis without septic shock: Secondary | ICD-10-CM | POA: Diagnosis not present

## 2020-01-01 DIAGNOSIS — Z87442 Personal history of urinary calculi: Secondary | ICD-10-CM | POA: Diagnosis not present

## 2020-01-01 DIAGNOSIS — A419 Sepsis, unspecified organism: Secondary | ICD-10-CM | POA: Diagnosis not present

## 2020-01-02 LAB — BASIC METABOLIC PANEL
BUN: 15 (ref 4–21)
CO2: 28 — AB (ref 13–22)
Chloride: 105 (ref 99–108)
Creatinine: 1.4 — AB (ref 0.6–1.3)
Glucose: 91
Potassium: 4.2 (ref 3.4–5.3)
Sodium: 138 (ref 137–147)

## 2020-01-02 LAB — HEPATIC FUNCTION PANEL: Bilirubin, Total: 0.4

## 2020-01-02 LAB — CBC AND DIFFERENTIAL
HCT: 36 — AB (ref 41–53)
Hemoglobin: 12.8 — AB (ref 13.5–17.5)
Neutrophils Absolute: 11
Platelets: 233 (ref 150–399)
WBC: 5.3

## 2020-01-02 LAB — CBC: RBC: 3.84 — AB (ref 3.87–5.11)

## 2020-01-02 LAB — COMPREHENSIVE METABOLIC PANEL
Albumin: 3.8 (ref 3.5–5.0)
Calcium: 9.2 (ref 8.7–10.7)

## 2020-01-04 ENCOUNTER — Ambulatory Visit: Payer: BC Managed Care – PPO | Admitting: Clinical

## 2020-01-05 ENCOUNTER — Encounter: Payer: Self-pay | Admitting: Family Medicine

## 2020-01-06 ENCOUNTER — Telehealth: Payer: Self-pay | Admitting: Allergy

## 2020-01-06 ENCOUNTER — Ambulatory Visit: Payer: Self-pay

## 2020-01-06 NOTE — Telephone Encounter (Signed)
Thanks

## 2020-01-06 NOTE — Telephone Encounter (Signed)
PT on the schedule for nucala today at 3pm, but recently got out of hospital for kidney infection & sepsis. Wants to know if he can come today or when he should come back. Per Dr Delorse Lek, reschedule to next week and make sure he is half way through or more from antibotics. Rescheduled to July 6th at 6pm

## 2020-01-11 ENCOUNTER — Ambulatory Visit (INDEPENDENT_AMBULATORY_CARE_PROVIDER_SITE_OTHER): Payer: BC Managed Care – PPO | Admitting: Clinical

## 2020-01-11 DIAGNOSIS — F331 Major depressive disorder, recurrent, moderate: Secondary | ICD-10-CM | POA: Diagnosis not present

## 2020-01-12 ENCOUNTER — Ambulatory Visit (INDEPENDENT_AMBULATORY_CARE_PROVIDER_SITE_OTHER): Payer: BC Managed Care – PPO

## 2020-01-12 ENCOUNTER — Other Ambulatory Visit: Payer: Self-pay

## 2020-01-12 DIAGNOSIS — J4550 Severe persistent asthma, uncomplicated: Secondary | ICD-10-CM | POA: Diagnosis not present

## 2020-01-12 DIAGNOSIS — J4551 Severe persistent asthma with (acute) exacerbation: Secondary | ICD-10-CM

## 2020-01-12 NOTE — Telephone Encounter (Signed)
Patient was given Nucala injection and wanted to do self teaching. Did not have him on the schedule for self teaching and need to get confirmation. Schedule next appt on February 09, 2020 @ 5pm. Please confirm this Dr. Delorse Lek.

## 2020-01-13 ENCOUNTER — Inpatient Hospital Stay: Payer: BC Managed Care – PPO | Admitting: Family Medicine

## 2020-01-13 NOTE — Telephone Encounter (Signed)
Yes he is to self-administer at home.  Please provide teaching.  His appt had to be rescheduled and he was hospitalized for kidney infection during his initial nucala date.

## 2020-01-13 NOTE — Telephone Encounter (Signed)
Patient informed that the next visit will provide self administer training of Nucala. I did add note on schedule notes that this was okay per MD.

## 2020-01-14 DIAGNOSIS — N181 Chronic kidney disease, stage 1: Secondary | ICD-10-CM | POA: Diagnosis not present

## 2020-01-14 DIAGNOSIS — I129 Hypertensive chronic kidney disease with stage 1 through stage 4 chronic kidney disease, or unspecified chronic kidney disease: Secondary | ICD-10-CM | POA: Diagnosis not present

## 2020-01-14 DIAGNOSIS — N319 Neuromuscular dysfunction of bladder, unspecified: Secondary | ICD-10-CM | POA: Diagnosis not present

## 2020-01-18 ENCOUNTER — Ambulatory Visit (INDEPENDENT_AMBULATORY_CARE_PROVIDER_SITE_OTHER): Payer: BC Managed Care – PPO | Admitting: Clinical

## 2020-01-18 DIAGNOSIS — F331 Major depressive disorder, recurrent, moderate: Secondary | ICD-10-CM

## 2020-01-20 ENCOUNTER — Telehealth: Payer: Self-pay | Admitting: Allergy

## 2020-01-20 NOTE — Telephone Encounter (Signed)
Please call pt to discuss the following:    I received recent note from your Nephrologist, Dr. Jean Rosenthal, who recommended a possible change from Trelegy.  Trelegy does have a medication component that has potential to cause urinary retention as a medication effect.  Due to this concern we could change back to a dual agent medication Breo which does not have that component in it.  Virgel Bouquet is very similar to Symbicort you had before however Virgel Bouquet is more like Trelegy in that it is a 1 puff once a day dry powder like the Trelegy.  Since you are on Nucala now it may be reasonable to change from Trelegy to [facility].   Let us know how your asthma is doing and how often you are needing to use your rescue inhaler.  If the control is improved then go to [place] would be a option.     Dr. Delorse Lek

## 2020-01-21 MED ORDER — BREO ELLIPTA 200-25 MCG/INH IN AEPB
1.0000 | INHALATION_SPRAY | Freq: Every day | RESPIRATORY_TRACT | 1 refills | Status: DC
Start: 2020-01-21 — End: 2020-04-04

## 2020-01-21 MED ORDER — ARNUITY ELLIPTA 100 MCG/ACT IN AEPB
1.0000 | INHALATION_SPRAY | Freq: Every day | RESPIRATORY_TRACT | 1 refills | Status: DC
Start: 2020-01-21 — End: 2020-03-21

## 2020-01-21 NOTE — Telephone Encounter (Signed)
Patient informed of this. He was already aware as he was there when the letter was written. He did want to let you know of a couple things from his side. He has had to use the albuterol inhaler about 8x weekly on average. He notices he is short of breath more than once a day, especially on exertion. He did stop the montelukast about 5 days ago due to mood decline. He did say that his mood has improved since stopping the montelukast. He is okay with doing Breo if this is what you recommend.

## 2020-01-21 NOTE — Addendum Note (Signed)
Addended by: Florence Canner on: 01/21/2020 04:53 PM   Modules accepted: Orders

## 2020-01-21 NOTE — Telephone Encounter (Signed)
Tried calling patient to let him know of the medication changes, but voicemail was full and couldn't leave a message to let him know of the inhaler changes. I put in the inhalers Arnuity and Breo 200 into the pharmacy.

## 2020-01-21 NOTE — Telephone Encounter (Signed)
Oh no his albuterol use it a lot and would like to have better control.  Agree with stopping singulair due to mood changes.  Due to the kidney concerns with the Trelegy I do think it is best then to change this medication so we can all be comfortable that his medications are not causing some other undue effect.   Since changing from the Trelegy would be a "step down" in therapy I think the best option would be to do Breo 200 1 puff once a day in addition to Arnuity 1 puff once a day.  Virgel Bouquet is a dual therapy agent with steroid and long-acting albuterol and Arnuity is single therapy agen with just inhaled steroid.  Breo and Arnuity would provide more inhaled steroid coverage (since Virgel Bouquet is not to be more than 1 puff a day).   Both Breo and Arnuity are the same ellipta device as Trelegy so he would take them the same way he does the Trelegy.  Continue monthly Nucala.

## 2020-01-23 DIAGNOSIS — F330 Major depressive disorder, recurrent, mild: Secondary | ICD-10-CM | POA: Diagnosis not present

## 2020-01-23 DIAGNOSIS — F4312 Post-traumatic stress disorder, chronic: Secondary | ICD-10-CM | POA: Diagnosis not present

## 2020-01-23 DIAGNOSIS — F411 Generalized anxiety disorder: Secondary | ICD-10-CM | POA: Diagnosis not present

## 2020-01-23 DIAGNOSIS — F5082 Avoidant/restrictive food intake disorder: Secondary | ICD-10-CM | POA: Diagnosis not present

## 2020-01-25 ENCOUNTER — Ambulatory Visit: Payer: BC Managed Care – PPO | Admitting: Clinical

## 2020-01-27 DIAGNOSIS — N181 Chronic kidney disease, stage 1: Secondary | ICD-10-CM | POA: Diagnosis not present

## 2020-01-27 DIAGNOSIS — N319 Neuromuscular dysfunction of bladder, unspecified: Secondary | ICD-10-CM | POA: Diagnosis not present

## 2020-01-28 DIAGNOSIS — N3090 Cystitis, unspecified without hematuria: Secondary | ICD-10-CM | POA: Diagnosis not present

## 2020-02-01 ENCOUNTER — Ambulatory Visit: Payer: BC Managed Care – PPO | Admitting: Clinical

## 2020-02-01 NOTE — Progress Notes (Signed)
Phone [phone] In person visit   Subjective:   James Matthews is a 24 y.o. year old very pleasant male patient who presents for/with See problem oriented charting Chief Complaint  Patient presents with  . ER follow up    sepsis f/u, poor appetite, c/o muscle and joint pain - mostly in neck and upper back, is experiencing tremors    This visit occurred during the SARS-CoV-2 public health emergency.  Safety protocols were in place, including screening questions prior to the visit, additional usage of staff PPE, and extensive cleaning of exam room while observing appropriate contact time as indicated for disinfecting solutions.   Past Medical History-  Patient Active Problem List   Diagnosis Date Noted  . Daily headache 06/02/2019    Priority: High  . Asthma, severe persistent 05/25/2019    Priority: High  . Major depression, recurrent (HCC) 06/04/2016    Priority: High  . Verbal apraxia     Priority: High  . PTSD (post-traumatic stress disorder) 10/19/2019    Priority: Medium  . Posterior urethral valves (PUV) 06/04/2016    Priority: Medium  . Essential hypertension 06/04/2016    Priority: Medium  . Bladder spasms 06/04/2016    Priority: Medium  . GAD (generalized anxiety disorder) 06/04/2016    Priority: Medium  . CKD (chronic kidney disease), stage II 06/04/2016    Priority: Medium  . Mesenteric lymphadenopathy 07/27/2016    Priority: Low  . GERD (gastroesophageal reflux disease) 06/04/2016    Priority: Low  . Premature birth     Priority: Low  . IBS (irritable bowel syndrome)     Priority: Low  . Social anxiety disorder 05/04/2018  . Insomnia 05/04/2018    Medications- reviewed and updated Current Outpatient Medications  Medication Sig Dispense Refill  . ALPRAZolam (XANAX) 1 MG tablet Take 1 mg by mouth 3 (three) times daily as needed for anxiety.    . busPIRone (BUSPAR) 30 MG tablet Take 1 tablet (30 mg total) by mouth 2 (two) times daily. 180  tablet 1  . Desvenlafaxine Succinate (PRISTIQ PO) Take 150 mg by mouth. Take 1 tablet in the morning    . Erenumab-aooe (AIMOVIG) 140 MG/ML SOAJ Inject 140 mg into the skin every 30 (thirty) days. 1 pen 11  . Fluticasone Furoate (ARNUITY ELLIPTA) 100 MCG/ACT AEPB Inhale 1 puff into the lungs daily. 30 each 1  . fluticasone furoate-vilanterol (BREO ELLIPTA) 200-25 MCG/INH AEPB Inhale 1 puff into the lungs daily. 31 each 1  . Fluticasone-Umeclidin-Vilant (TRELEGY ELLIPTA) 200-62.5-25 MCG/INH AEPB Inhale 1 Inhaler into the lungs daily. 28 each 5  . levalbuterol (XOPENEX HFA) 45 MCG/ACT inhaler Inhale 2 puffs into the lungs every 4 (four) hours as needed for wheezing. 1 Inhaler 12  . Mepolizumab (NUCALA) 100 MG/ML SOAJ Inject 100 mg into the skin every 30 (thirty) days.    Marland Kitchen. MOTEGRITY 2 MG TABS Take 1 tablet by mouth at bedtime.    . ondansetron (ZOFRAN-ODT) 8 MG disintegrating tablet TAKE 1 TABLET (8 MG TOTAL) BY MOUTH EVERY 8 (EIGHT) HOURS AS NEEDED FOR NAUSEA OR VOMITING. 20 tablet 3  . pantoprazole (PROTONIX) 40 MG tablet Take by mouth.    . rizatriptan (MAXALT-MLT) 10 MG disintegrating tablet Take 1 tablet earliest onset of migraine.  May repeat in 2 hours if needed.  Maximum 2 tablets in 24 hours. 10 tablet 3  . clonazePAM (KLONOPIN) 1 MG tablet Take 1 tablet (1 mg total) by mouth 3 (three) times daily as needed  for anxiety. (Patient not taking: Reported on 02/03/2020) 90 tablet 1  . losartan (COZAAR) 100 MG tablet Take 1 tablet by mouth every evening.     . montelukast (SINGULAIR) 10 MG tablet Take 1 tablet (10 mg total) by mouth at bedtime. (Patient not taking: Reported on 02/03/2020) 30 tablet 5   Current Facility-Administered Medications  Medication Dose Route Frequency Provider Last Rate Last Admin  . Mepolizumab SOLR 100 mg  100 mg Subcutaneous Q28 days Marcelyn Bruins, MD   100 mg at 01/12/20 1725     Objective:  BP 128/78   Pulse (!) 113   Temp 98 F (36.7 C) (Temporal)    Wt 164 lb 9.6 oz (74.7 kg)   SpO2 98%   BMI 21.72 kg/m  Gen: NAD, resting comfortably CV: RRR no murmurs rubs or gallops.  Tachycardic initially but not on my exam Lungs: CTAB no crackles, wheeze, rhonchi Abdomen: soft/nontender other than mild pain in left upper and lower quadrant/nondistended/normal bowel sounds. No rebound or guarding.  Ext: no edema Skin: warm, dry     Assessment and Plan  Hospital F/U S:[facility] hospitalization from last month pulled in from care everywhere "Hospital Course:  For full details, please see H&P, progress notes, consult notes and ancillary notes. Briefly, James Matthews is a 24 y/o male with a medical hx of solitary kidney, CKD II, hydronephrosis, asthma, nephrolithiasis, and childhood UTI's. He presented with dysuria and trouble initiating urination along with left sided back pain and nausea treated with Zofran for two days. Woke up with a fever of 102 prior to day of admission, which prompted him to visit urgent care where he had a UA concerning for a UTI as well as concerns of a AKI, he was then prompted to visit the ED. He was admitted and treated for sepsis 2/2 complicated UTI.  Remainder of hospital course will be summarized in a problem-based format:  #Sepsis 2/2 complicated UTI #AKI #solitary kidney with hx posterior urethral valves Patient presented to the ED for fever along with dysuria and urinary retention, along with left flank pain and pelvic pain. In the ED, the patient was tachycardic to 115, had soft bps and was afebrile. UA appeared infectious with 2+ nitrite, 500 leukocyte esterase and 221 WBC along with 3+ blood. BMP was remarkable for AKI with Cr 1.7 (BL of 1.2). CT stone protocol did not show kidney stones but was notable for thickened bladder wall indicative of cystitis. Of note, patient only has a left kidney and has hx of posterior urethral valves and endorsed having many UTI's as a child, however denies having had any since then.  Also of note, no evidence of perinephric abscess was seen on CT stone protocol. He was given empiric ceftriaxone and IVF in the ED and admitted. Patient had clinical deterioration shortly after arriving to the floor and received IVF for hypotension with MAP between 60-65. Antibiotics were broadened to IV Cefepime. The patient did not show significant improvement by the next day and the decision was made to broaden the patient out to meropenem, along with giving IVF with cultures returning positive for E. Coli without susceptibilities. The next day urine culture susceptibilities resulted pan-susceptible and the patient was also showing signs of improvement clinically. Antibiotics were deescalated to rocephin. Patient was also endorsing some joint pain. Pt was tested for [facility] which was negative. Blood cultures NGTD. AKI improved throughout hospitalization, on day of discharge Cr was back to baseline at 1.09. Patient was  discharged with PO Ciprofloxacin to complete 7 day total course of abx. Ciprofloxacin was chosen for good urinary penetration with patient only having one kidney along with degree of severity of presentation.  #Acute urinary retention Patient presented with acute urinary retention and a foley catheter was placed. At baseline patient endorsed having some hesitancy with initiating urinary stream, and he follows with a Urologist as an outpatient. Voiding trial was completed successfully prior to discharge and patient was endorsing mild dysuria with urination along with slightly more prolonged time to initiate urinary stream along with mild RLQ pain on day of discharge. Patient endorsed that if symptoms worsened or recurred that he would be following up with his Urologist soon. He also follows with Nephrology as an outpatient.  Discharge Follow-up Action Items: 1. Gold card place for PCP follow up in one week 2. Gold card placed for Nephrology follow up in 2-4 weeks 3. Continue 7 day course of  antibiotics with Ciprofloxacin (will finish course on 01/08/2020) 4. Follow up with Urology as needed  "   Today patient reports since he has gotten out has felt really weak and fatigued. Has had some muscle pain up to 6/10 in upper back and joint pain- such as up in neck area. Also has some left abdominal pain- can get up to 4/10. If active can get pain in lower legs. Poor appetite. Has felt somewhat shaky as well. Mom with him today and reports color better today but hasnt been really good. He feels out of it at time.   1st UTI since he was a child  Saw nephrology Dr. Jean Rosenthal- apparently had some possible liver inflammation  Sees urologist on 14th of august- some trouble emptying and starting urine stream but voiding trial was successful before leaving hospital.   A/P: Patient was hospitalized last month with sepsis.  No signs of recurrent sepsis-reviewed urine culture from care everywhere this month which was negative for UTI.  He is slowly improving in regards to fatigue.  Does have some musculoskeletal complaints including upper back and joint pain-we discussed ciprofloxacin can contribute to musculoskeletal pain.  Since he is improving he wants to monitor for now but I did offer sports medicine referral.  He has had some left-sided abdominal pain as well-he is going to monitor and if this fails to improve will let me know and we can consider further work-up.  He had an ultrasound of his urological system when hospitalized but if had worsening abdominal pain could consider CT scan.  He did have acute kidney injury while hospitalized-we will recheck kidney function today.  Need to follow carefully due to unilateral kidney.  Glad he is already seen nephrology and has urology follow-up planned.  Had some urinary retention issues while hospitalized-has some mild issues now such as getting stream started so once again thankfully he has urology follow-up  #hypertension S: medication: Blood pressure  had gotten up to 140 this weekend and bottom number up to102. Got in contact with nephrology and increased losartan to 100mg .   BP Readings from Last 3 Encounters:  02/03/20 128/78  12/09/19 124/82  11/25/19 112/72  A/P: Excellent control on losartan 100 mg-continue current medication  # Depression S: Medication: He finally got in with psychiatry (. Switched clonazepam from xanax. Is going to do therapy alle dbrain spotting- PTSD support- somewhat similar to EMDR  He is doing better despite multiple stressors including sepsis and  Lost an aunt to ovarian cancer and dad in bad accident  that he thankfully survived Depression screen [facility] 2/9 02/03/2020 11/17/2019 10/19/2019  Decreased Interest 0 0 2  Down, Depressed, Hopeless 0 0 1  PHQ - 2 Score 0 0 3  Altered sleeping 1 - 1  Tired, decreased energy 3 - 3  Change in appetite 3 - 0  Feeling bad or failure about yourself  0 - 0  Trouble concentrating 1 - 3  Moving slowly or fidgety/restless 0 - 0  Suicidal thoughts 0 - 0  PHQ-9 Score 8 - 10  Difficult doing work/chores Somewhat difficult - Not difficult at all  A/P: PHQ-9 is still elevated at 8 today but no anhedonia or depressed mood and no suicidal thoughts-I would consider this full remission.  Thrilled for psychiatry's assistance in fact patient was finally able to get in   Recommended follow up: Return in about 4 months (around 06/05/2020) for physical or sooner if needed. Future Appointments  Date Time Provider Department Center  02/08/2020 12:00 PM Mendelson, Hulda Humphrey [facility] None  02/09/2020  5:00 PM [facility] NURSE [facility] None  02/15/2020 12:00 PM Mendelson, Eileen Stanford L [facility] None  02/22/2020 12:00 PM Mendelson, Jenna L [facility] None  02/29/2020 12:00 PM Mendelson, Jenna L [facility] None  03/07/2020 12:00 PM Mendelson, Jenna L [facility] None  03/14/2020 12:00 PM Mendelson, Jenna L [facility] None  03/21/2020 12:00 PM Mendelson, Jenna L [facility] None  03/28/2020 12:00 PM Mendelson, Jenna L  [facility] None  03/30/2020  2:30 PM Padgett, Pilar Grammes, MD [facility] None  03/31/2020  1:30 PM Drema Dallas, DO [facility] None  04/04/2020 12:00 PM Mendelson, Eileen Stanford L [facility] None  04/11/2020 12:00 PM Mendelson, Eileen Stanford L [facility] None  04/18/2020 12:00 PM Mendelson, Eileen Stanford L [facility] None  04/25/2020 12:00 PM Mendelson, Eileen Stanford L [facility] None  05/02/2020 12:00 PM Mendelson, Jenna L [facility] None  05/09/2020 12:00 PM Mendelson, Eileen Stanford L [facility] None  05/16/2020 12:00 PM Mendelson, Eileen Stanford L [facility] None  05/23/2020 12:00 PM Mendelson, Eileen Stanford L [facility] None  05/30/2020 12:00 PM Mendelson, Hulda Humphrey [facility] None  06/10/2020  1:20 PM Durene Cal, Aldine Contes, MD [facility]    Lab/Order associations:   ICD-10-CM   1. Essential hypertension  I10 Comprehensive metabolic panel    CBC with Differential/Platelet    CBC with Differential/Platelet    Comprehensive metabolic panel    CANCELED: CBC with Differential/Platelet    CANCELED: Comprehensive metabolic panel  2. Recurrent major depressive disorder, in full remission (HCC)  F33.42   3. Daily headache.  Using this for prior blood work completion-release labs that were previously ordered R51.9 CBC with Differential future    Cortisol  4. Encounter for hepatitis C screening test for low risk patient  Z11.59 Hepatitis C antibody    Hepatitis C antibody   Time Spent: 42 minutes of total time (10:32 AM- 11:08 AM, 3:58 PM-4:04 PM) was spent on the date of the encounter performing the following actions: chart review prior to seeing the patient, obtaining history, performing a medically necessary exam, counseling on the treatment plan, placing orders, and documenting in our EHR.   Return precautions advised.  Tana Conch, MD

## 2020-02-01 NOTE — Patient Instructions (Addendum)
Health Maintenance Due  Topic Date Due  . Hepatitis C Screening - today Never done  . COVID-19 Vaccine (1)- still considering Never done   Please stop by lab before you go If you have mychart- we will send your results within 3 business days of [place] receiving them.  If you do not have mychart- we will call you about results within 5 business days of [place] receiving them.   Let me know if you want to do sports medicine visit for musculoskeletal issues like back pain.

## 2020-02-03 ENCOUNTER — Other Ambulatory Visit: Payer: Self-pay

## 2020-02-03 ENCOUNTER — Ambulatory Visit (INDEPENDENT_AMBULATORY_CARE_PROVIDER_SITE_OTHER): Payer: BC Managed Care – PPO | Admitting: Family Medicine

## 2020-02-03 ENCOUNTER — Encounter: Payer: Self-pay | Admitting: Family Medicine

## 2020-02-03 VITALS — BP 128/78 | HR 113 | Temp 98.0°F | Wt 164.6 lb

## 2020-02-03 DIAGNOSIS — F3342 Major depressive disorder, recurrent, in full remission: Secondary | ICD-10-CM

## 2020-02-03 DIAGNOSIS — R519 Headache, unspecified: Secondary | ICD-10-CM | POA: Diagnosis not present

## 2020-02-03 DIAGNOSIS — I10 Essential (primary) hypertension: Secondary | ICD-10-CM

## 2020-02-03 DIAGNOSIS — Z1159 Encounter for screening for other viral diseases: Secondary | ICD-10-CM | POA: Diagnosis not present

## 2020-02-03 DIAGNOSIS — Z8619 Personal history of other infectious and parasitic diseases: Secondary | ICD-10-CM

## 2020-02-03 NOTE — Addendum Note (Signed)
Addended by: Altamese Cabal on: 02/03/2020 11:10 AM   Modules accepted: Orders

## 2020-02-03 NOTE — Assessment & Plan Note (Signed)
S: medication: Blood pressure had gotten up to 140 this weekend and bottom number up to102. Got in contact with nephrology and increased losartan to 100mg .   BP Readings from Last 3 Encounters:  02/03/20 128/78  12/09/19 124/82  11/25/19 112/72  A/P: Excellent control on losartan 100 mg-continue current medication

## 2020-02-03 NOTE — Assessment & Plan Note (Signed)
S: Medication: He finally got in with psychiatry (. Switched clonazepam from xanax. Is going to do therapy alle dbrain spotting- PTSD support- somewhat similar to EMDR  He is doing better despite multiple stressors including sepsis and  Lost an aunt to ovarian cancer and dad in bad accident that he thankfully survived Depression screen [facility] 2/9 02/03/2020 11/17/2019 10/19/2019  Decreased Interest 0 0 2  Down, Depressed, Hopeless 0 0 1  PHQ - 2 Score 0 0 3  Altered sleeping 1 - 1  Tired, decreased energy 3 - 3  Change in appetite 3 - 0  Feeling bad or failure about yourself  0 - 0  Trouble concentrating 1 - 3  Moving slowly or fidgety/restless 0 - 0  Suicidal thoughts 0 - 0  PHQ-9 Score 8 - 10  Difficult doing work/chores Somewhat difficult - Not difficult at all  A/P: PHQ-9 is still elevated at 8 today but no anhedonia or depressed mood and no suicidal thoughts-I would consider this full remission.  Thrilled for psychiatry's assistance in fact patient was finally able to get in

## 2020-02-04 LAB — CBC WITH DIFFERENTIAL/PLATELET
Absolute Monocytes: 350 cells/uL (ref 200–950)
Basophils Absolute: 38 cells/uL (ref 0–200)
Basophils Relative: 1 %
Eosinophils Absolute: 61 cells/uL (ref 15–500)
Eosinophils Relative: 1.6 %
HCT: 44.8 % (ref 38.5–50.0)
Hemoglobin: 15.4 g/dL (ref 13.2–17.1)
Lymphs Abs: 1197 cells/uL (ref 850–3900)
MCH: 32.4 pg (ref 27.0–33.0)
MCHC: 34.4 g/dL (ref 32.0–36.0)
MCV: 94.3 fL (ref 80.0–100.0)
MPV: 9.8 fL (ref 7.5–12.5)
Monocytes Relative: 9.2 %
Neutro Abs: 2155 cells/uL (ref 1500–7800)
Neutrophils Relative %: 56.7 %
Platelets: 238 10*3/uL (ref 140–400)
RBC: 4.75 10*6/uL (ref 4.20–5.80)
RDW: 12.3 % (ref 11.0–15.0)
Total Lymphocyte: 31.5 %
WBC: 3.8 10*3/uL (ref 3.8–10.8)

## 2020-02-04 LAB — COMPREHENSIVE METABOLIC PANEL
AG Ratio: 2.2 (calc) (ref 1.0–2.5)
ALT: 22 U/L (ref 9–46)
AST: 23 U/L (ref 10–40)
Albumin: 5.3 g/dL — ABNORMAL HIGH (ref 3.6–5.1)
Alkaline phosphatase (APISO): 43 U/L (ref 36–130)
BUN: 14 mg/dL (ref 7–25)
CO2: 30 mmol/L (ref 20–32)
Calcium: 10.5 mg/dL — ABNORMAL HIGH (ref 8.6–10.3)
Chloride: 98 mmol/L (ref 98–110)
Creat: 1.12 mg/dL (ref 0.60–1.35)
Globulin: 2.4 g/dL (calc) (ref 1.9–3.7)
Glucose, Bld: 74 mg/dL (ref 65–99)
Potassium: 4.7 mmol/L (ref 3.5–5.3)
Sodium: 138 mmol/L (ref 135–146)
Total Bilirubin: 1.2 mg/dL (ref 0.2–1.2)
Total Protein: 7.7 g/dL (ref 6.1–8.1)

## 2020-02-04 LAB — HEPATITIS C ANTIBODY
Hepatitis C Ab: NONREACTIVE
SIGNAL TO CUT-OFF: 0.01 (ref <1.00)

## 2020-02-04 LAB — CORTISOL: Cortisol, Plasma: 12.6 ug/dL

## 2020-02-08 ENCOUNTER — Ambulatory Visit (INDEPENDENT_AMBULATORY_CARE_PROVIDER_SITE_OTHER): Payer: BC Managed Care – PPO | Admitting: Clinical

## 2020-02-08 DIAGNOSIS — F331 Major depressive disorder, recurrent, moderate: Secondary | ICD-10-CM | POA: Diagnosis not present

## 2020-02-09 ENCOUNTER — Ambulatory Visit: Payer: Self-pay

## 2020-02-09 ENCOUNTER — Other Ambulatory Visit: Payer: Self-pay

## 2020-02-09 DIAGNOSIS — J4551 Severe persistent asthma with (acute) exacerbation: Secondary | ICD-10-CM

## 2020-02-09 NOTE — Progress Notes (Signed)
Patient is here today for teaching on self administration of Nucala auto-injector. Patient demonstrated proper technique and did self administer the Nucala into the right side of abdomen. He will do this at home every 4 weeks. Patient waited 30 minutes in office post injection with no local or systemic symptoms. I did let him know that if he has any further questions to reach out to the office.

## 2020-02-12 DIAGNOSIS — R300 Dysuria: Secondary | ICD-10-CM | POA: Diagnosis not present

## 2020-02-12 DIAGNOSIS — R309 Painful micturition, unspecified: Secondary | ICD-10-CM | POA: Diagnosis not present

## 2020-02-15 ENCOUNTER — Ambulatory Visit (INDEPENDENT_AMBULATORY_CARE_PROVIDER_SITE_OTHER): Payer: BC Managed Care – PPO | Admitting: Clinical

## 2020-02-15 ENCOUNTER — Ambulatory Visit: Payer: BC Managed Care – PPO | Admitting: Clinical

## 2020-02-15 DIAGNOSIS — F331 Major depressive disorder, recurrent, moderate: Secondary | ICD-10-CM

## 2020-02-16 DIAGNOSIS — F4312 Post-traumatic stress disorder, chronic: Secondary | ICD-10-CM | POA: Diagnosis not present

## 2020-02-18 DIAGNOSIS — F4312 Post-traumatic stress disorder, chronic: Secondary | ICD-10-CM | POA: Diagnosis not present

## 2020-02-18 DIAGNOSIS — F411 Generalized anxiety disorder: Secondary | ICD-10-CM | POA: Diagnosis not present

## 2020-02-18 DIAGNOSIS — F330 Major depressive disorder, recurrent, mild: Secondary | ICD-10-CM | POA: Diagnosis not present

## 2020-02-18 DIAGNOSIS — R482 Apraxia: Secondary | ICD-10-CM | POA: Diagnosis not present

## 2020-02-22 ENCOUNTER — Ambulatory Visit (INDEPENDENT_AMBULATORY_CARE_PROVIDER_SITE_OTHER): Payer: BC Managed Care – PPO | Admitting: Clinical

## 2020-02-22 DIAGNOSIS — F331 Major depressive disorder, recurrent, moderate: Secondary | ICD-10-CM | POA: Diagnosis not present

## 2020-02-23 DIAGNOSIS — F4312 Post-traumatic stress disorder, chronic: Secondary | ICD-10-CM | POA: Diagnosis not present

## 2020-02-29 ENCOUNTER — Ambulatory Visit (INDEPENDENT_AMBULATORY_CARE_PROVIDER_SITE_OTHER): Payer: BC Managed Care – PPO | Admitting: Clinical

## 2020-02-29 DIAGNOSIS — F331 Major depressive disorder, recurrent, moderate: Secondary | ICD-10-CM

## 2020-03-01 ENCOUNTER — Other Ambulatory Visit: Payer: Self-pay | Admitting: Neurology

## 2020-03-01 DIAGNOSIS — F4312 Post-traumatic stress disorder, chronic: Secondary | ICD-10-CM | POA: Diagnosis not present

## 2020-03-07 ENCOUNTER — Ambulatory Visit (INDEPENDENT_AMBULATORY_CARE_PROVIDER_SITE_OTHER): Payer: BC Managed Care – PPO | Admitting: Clinical

## 2020-03-07 DIAGNOSIS — F331 Major depressive disorder, recurrent, moderate: Secondary | ICD-10-CM | POA: Diagnosis not present

## 2020-03-08 DIAGNOSIS — F4312 Post-traumatic stress disorder, chronic: Secondary | ICD-10-CM | POA: Diagnosis not present

## 2020-03-12 ENCOUNTER — Other Ambulatory Visit: Payer: Self-pay | Admitting: Allergy

## 2020-03-14 ENCOUNTER — Ambulatory Visit: Payer: BC Managed Care – PPO | Admitting: Clinical

## 2020-03-16 DIAGNOSIS — F4312 Post-traumatic stress disorder, chronic: Secondary | ICD-10-CM | POA: Diagnosis not present

## 2020-03-19 ENCOUNTER — Other Ambulatory Visit: Payer: Self-pay | Admitting: Allergy

## 2020-03-19 DIAGNOSIS — F411 Generalized anxiety disorder: Secondary | ICD-10-CM | POA: Diagnosis not present

## 2020-03-19 DIAGNOSIS — R482 Apraxia: Secondary | ICD-10-CM | POA: Diagnosis not present

## 2020-03-19 DIAGNOSIS — F330 Major depressive disorder, recurrent, mild: Secondary | ICD-10-CM | POA: Diagnosis not present

## 2020-03-19 DIAGNOSIS — F4312 Post-traumatic stress disorder, chronic: Secondary | ICD-10-CM | POA: Diagnosis not present

## 2020-03-21 ENCOUNTER — Ambulatory Visit (INDEPENDENT_AMBULATORY_CARE_PROVIDER_SITE_OTHER): Payer: BC Managed Care – PPO | Admitting: Clinical

## 2020-03-21 ENCOUNTER — Other Ambulatory Visit: Payer: Self-pay

## 2020-03-21 DIAGNOSIS — F331 Major depressive disorder, recurrent, moderate: Secondary | ICD-10-CM

## 2020-03-21 NOTE — Progress Notes (Signed)
error 

## 2020-03-22 DIAGNOSIS — F4312 Post-traumatic stress disorder, chronic: Secondary | ICD-10-CM | POA: Diagnosis not present

## 2020-03-28 ENCOUNTER — Ambulatory Visit (INDEPENDENT_AMBULATORY_CARE_PROVIDER_SITE_OTHER): Payer: BC Managed Care – PPO | Admitting: Clinical

## 2020-03-28 DIAGNOSIS — F331 Major depressive disorder, recurrent, moderate: Secondary | ICD-10-CM | POA: Diagnosis not present

## 2020-03-29 DIAGNOSIS — F4312 Post-traumatic stress disorder, chronic: Secondary | ICD-10-CM | POA: Diagnosis not present

## 2020-03-29 NOTE — Progress Notes (Signed)
NEUROLOGY FOLLOW UP OFFICE NOTE  James Christian Kernan [id number]  HISTORY OF PRESENT ILLNESS: James Matthews is a 24 year old male with HTN and asthma who follows up for migraine.  He is accompanied by his mother who supplements history.  UPDATE: He was hospitalized in June for sepsis secondary to UTI.  Aimovig increased to 140mg  in May.  Doing better.  Not affecting his verbal apraxia as much. Intensity:  Moderate to severe Duration:  1 hour with rizatriptan Frequency:  5 in past 30 days Current NSAIDS:none Current analgesics:Tylenol (he was previously taking 3 to 4 days a week) Current triptans:rizatriptan 10mg  Current ergotamine:none Current anti-emetic:Zofran ODT 8mg  Current muscle relaxants:none Current anti-anxiolytic:Clonazepam; busipirone Current sleep aide:none Current Antihypertensive medications:losartan Current Antidepressant medications:Pristiq Current Anticonvulsant medications:none Current anti-CGRP:Aimovig 140mg  Current Vitamins/Herbal/Supplements:none Current Antihistamines/Decongestants:Astelin Other therapy:none Hormone/birth control:none  Caffeine:1 cup coffee daily. Diet:2 liters water daily. No soda. Exercise:Once a week Depression:Yes but stable; Anxiety:Yes but stable Other pain:no Sleep hygiene:8 hours of sleep a night. Previously poor  HISTORY: He has history of migraines since childhood but have gotten worse about 2 months ago. He has no explanation for increased frequency. They are severe right worse than left parietal pressure-like headaches. They are associated with nausea, visual disturbance (red dots) photophobia and phonophobia. They last from several hours to several days. They have been occurring daily (previously occurring 3 to 4 times a week). Triggers include light and sound. Due to his speech disorder, prolonged talking may be a trigger. Laying down to rest in dark and quiet  room helps.  Prednisone taper was ineffectiveafter it was finished.  MRI of brain with and without contrast on 08/06/2019 was normal.  Remote CT head without contrast from 02/19/2007 to evaluate headache was personally reviewed and demonstrated incidental findings consistent with right ethmoid sinusitis but no acute intracranial abnormality.  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 No known cause but possibly genetic. He was born 3 months premature.   Past NSAIDS/steroids:Prednisone. Unable to take NSAIDs due to renal disorder. Past analgesics:none Past abortive triptans:sumatriptan (ineffective) Past abortive ergotamine:none Past muscle relaxants:none Past anti-emetic:Zofran ODT 4mg  Past antihypertensive medications:lisinopril Past antidepressant/antipsychotic/moodmedications: Fluoxetine, mirtazepine, Seroquel, trazodone Past anticonvulsant medications:Gabapentin 600mg  Past anti-CGRP:nonw Past vitamins/Herbal/Supplements:none Past antihistamines/decongestants:none Other past therapies:none   Family history of headache:Mom (migraines); brother (migraines); sister (migraines)  PAST MEDICAL HISTORY: Past Medical History:  Diagnosis Date  . Asthma   . Hypertension   . IBS (irritable bowel syndrome)    probiotic  . Posterior urethral valves   . Premature birth    3 months early  . Renal disorder   . Verbal apraxia     MEDICATIONS: Current Outpatient Medications on File Prior to Visit  Medication Sig Dispense Refill  . ALPRAZolam (XANAX) 1 MG tablet Take 1 mg by mouth 3 (three) times daily as needed for anxiety.    . ARNUITY ELLIPTA 100 MCG/ACT AEPB INHALE 1 PUFF INTO THE LUNGS EVERY DAY 30 each 3  . busPIRone (BUSPAR) 30 MG tablet Take 1 tablet (30 mg total) by mouth 2 (two) times daily. 180 tablet 1  . clonazePAM (KLONOPIN) 1 MG tablet Take 1 tablet (1 mg total) by mouth 3 (three) times daily as needed for anxiety. (Patient not  taking: Reported on 02/03/2020) 90 tablet 1  . Desvenlafaxine Succinate (PRISTIQ PO) Take 150 mg by mouth. Take 1 tablet in the morning    . Erenumab-aooe (AIMOVIG) 140 MG/ML SOAJ Inject 140 mg into the skin every 30 (thirty)  days. 1 pen 11  . fluticasone furoate-vilanterol (BREO ELLIPTA) 200-25 MCG/INH AEPB Inhale 1 puff into the lungs daily. 31 each 1  . Fluticasone-Umeclidin-Vilant (TRELEGY ELLIPTA) 200-62.5-25 MCG/INH AEPB Inhale 1 Inhaler into the lungs daily. 28 each 5  . levalbuterol (XOPENEX HFA) 45 MCG/ACT inhaler Inhale 2 puffs into the lungs every 4 (four) hours as needed for wheezing. 1 Inhaler 12  . losartan (COZAAR) 100 MG tablet Take 1 tablet by mouth every evening.     . Mepolizumab (NUCALA) 100 MG/ML SOAJ Inject 100 mg into the skin every 30 (thirty) days.    . montelukast (SINGULAIR) 10 MG tablet Take 1 tablet (10 mg total) by mouth at bedtime. (Patient not taking: Reported on 02/03/2020) 30 tablet 5  . MOTEGRITY 2 MG TABS Take 1 tablet by mouth at bedtime.    . ondansetron (ZOFRAN-ODT) 8 MG disintegrating tablet TAKE 1 TABLET (8 MG TOTAL) BY MOUTH EVERY 8 (EIGHT) HOURS AS NEEDED FOR NAUSEA OR VOMITING. 20 tablet 3  . pantoprazole (PROTONIX) 40 MG tablet Take by mouth.    . rizatriptan (MAXALT-MLT) 10 MG disintegrating tablet TAKE 1 TABLET EARLIEST ONSET OF MIGRAINE. MAY REPEAT IN 2 HRS IF NEEDED. MAX 2 TABLETS IN 24 HRS 6 tablet 0   Current Facility-Administered Medications on File Prior to Visit  Medication Dose Route Frequency Provider Last Rate Last Admin  . Mepolizumab SOLR 100 mg  100 mg Subcutaneous Q28 days Marcelyn Bruins, MD   100 mg at 01/12/20 1725    ALLERGIES: Allergies  Allergen Reactions  . Albuterol     High HR  . Chocolate     Face swelling/sounds like angioedema "I had to go to urgent care today due to an allergic reaction to a chocolate candy bar. My throat swelled up and was closing, my nasal passages were congested, and my face was hurting.  They gave me a steroid shot at the clinic, and then they sent me home presidone to take for a week" 06/2019  . Lisinopril Other (See Comments)  . Other Other (See Comments)    Pain med? Unsure of medication, caused n/v    FAMILY HISTORY: Family History  Problem Relation Age of Onset  . Breast cancer Mother        survivor  . Hypertension Mother   . Hyperlipidemia Mother   . Other Father        pituitary tumor- gamma knife radiation  . Heart disease Maternal Grandfather   . Prostate cancer Maternal Grandfather        25s  . Melanoma Sister   . Prostate cancer Paternal Grandfather        20s  . Ovarian cancer Maternal Aunt        mets to liver     SOCIAL HISTORY: Social History   Socioeconomic History  . Marital status: Single    Spouse name: Not on file  . Number of children: Not on file  . Years of education: Not on file  . Highest education level: Not on file  Occupational History  . Not on file  Tobacco Use  . Smoking status: Never Smoker  . Smokeless tobacco: Never Used  Vaping Use  . Vaping Use: Never assessed  Substance and Sexual Activity  . Alcohol use: No  . Drug use: No  . Sexual activity: Not on file  Other Topics Concern  . Not on file  Social History Narrative   Single. Lives with mom and dad. His brother  and wife and nephew also live with him. Brother and sister- healthy.       Full time at [employer]. Studying psychology with biology minor.    Thinking grad school for psychology- would plan for research over clinical   Right handed   Two story home   Does drink caffeine, one large coffee daily   Social Determinants of Health   Financial Resource Strain:   . Difficulty of Paying Living Expenses: Not on file  Food Insecurity:   . Worried About Programme researcher, broadcasting/film/video in the Last Year: Not on file  . Ran Out of Food in the Last Year: Not on file  Transportation Needs:   . Lack of Transportation (Medical): Not on file  . Lack of  Transportation (Non-Medical): Not on file  Physical Activity:   . Days of Exercise per Week: Not on file  . Minutes of Exercise per Session: Not on file  Stress:   . Feeling of Stress : Not on file  Social Connections:   . Frequency of Communication with Friends and Family: Not on file  . Frequency of Social Gatherings with Friends and Family: Not on file  . Attends Religious Services: Not on file  . Active Member of Clubs or Organizations: Not on file  . Attends Banker Meetings: Not on file  . Marital Status: Not on file  Intimate Partner Violence:   . Fear of Current or Ex-Partner: Not on file  . Emotionally Abused: Not on file  . Physically Abused: Not on file  . Sexually Abused: Not on file    PHYSICAL EXAM: Blood pressure 129/78, pulse (!) 112, height 6\' 1"  (1.854 m), weight 168 lb 12.8 oz (76.6 kg), SpO2 97 %. General: No acute distress.  Patient appears well-groomed.   Head:  Normocephalic/atraumatic Eyes:  Fundi examined but not visualized Neck: supple, no paraspinal tenderness, full range of motion Heart:  Regular rate and rhythm Lungs:  Clear to auscultation bilaterally Back: No paraspinal tenderness Neurological Exam: alert and oriented to person, place, and time. Attention span and concentration intact, recent and remote memory intact, fund of knowledge intact.  Speech fluent and not dysarthric, language intact.  CN II-XII intact. Bulk and tone normal, muscle strength 5/5 throughout.  Sensation to light touch, temperature and vibration intact.  Deep tendon reflexes 2+ throughout, toes downgoing.  Finger to nose and heel to shin testing intact.  Gait normal, Romberg negative.  IMPRESSION: Migraine without aura, without status migrainosus, not intractable  PLAN: 1.  Aimovig 140mg  monthly 2.  Rizatriptan 10mg  as needed 3.  Limit use of pain relievers to no more than 2 days out of week to prevent risk of rebound or medication-overuse headache. 4.  Keep  headache diary 5.  Follow up in 6 months.  , DO  CC: , MD

## 2020-03-30 ENCOUNTER — Encounter: Payer: Self-pay | Admitting: Allergy

## 2020-03-30 ENCOUNTER — Other Ambulatory Visit: Payer: Self-pay

## 2020-03-30 ENCOUNTER — Ambulatory Visit (INDEPENDENT_AMBULATORY_CARE_PROVIDER_SITE_OTHER): Payer: BC Managed Care – PPO | Admitting: Allergy

## 2020-03-30 VITALS — BP 130/70 | HR 96 | Temp 98.6°F | Resp 18

## 2020-03-30 DIAGNOSIS — J3089 Other allergic rhinitis: Secondary | ICD-10-CM | POA: Diagnosis not present

## 2020-03-30 DIAGNOSIS — J4551 Severe persistent asthma with (acute) exacerbation: Secondary | ICD-10-CM | POA: Diagnosis not present

## 2020-03-30 DIAGNOSIS — H1013 Acute atopic conjunctivitis, bilateral: Secondary | ICD-10-CM

## 2020-03-30 DIAGNOSIS — T7800XA Anaphylactic reaction due to unspecified food, initial encounter: Secondary | ICD-10-CM

## 2020-03-30 DIAGNOSIS — T7800XD Anaphylactic reaction due to unspecified food, subsequent encounter: Secondary | ICD-10-CM

## 2020-03-30 NOTE — Patient Instructions (Addendum)
Asthma with current exacerbation  - have access to levalbuterol inhaler 2 puffs every 4-6 hours as needed for cough/wheeze/shortness of breath/chest tightness.  May use 15-20 minutes prior to activity.   Monitor frequency of use.    - continue Breo 1 puff daily and Arnuity 1 puff daily at this time.   If you continue to be under good control will try to wean inhalers down  - continue monthly Nucala injection self-administered at home  - at this time control is improved  Asthma control goals:   Full participation in all desired activities (may need albuterol before activity)  Albuterol use two time or less a week on average (not counting use with activity)  Cough interfering with sleep two time or less a month  Oral steroids no more than once a year  No hospitalizations  Allergies - continue avoidance measures for grass pollens, weed pollens, tree pollens, molds, dust mite, cat, mouse, mixed feathers - continue Zyrtec 10mg  daily thru fall (weed pollen season ends at the first frost)   - for watery eyes can use of Pataday 1 drop each eye daily as needed - for nasal congestion continue use of nasal steroid spray like Flonase.  Best used for 1-2 weeks at a time for maximum effect - if medication management is not effective enough then consider allergen immunotherapy (allergy shots) however your asthma needs to be under good control before considering this option.  Allergic reaction  - continue avoidance of tree nuts  - have access to self-injectable epinephrine AuviQ 0.3mg  at all times  - follow emergency action plan in case of allergic reaction  Follow-up in 4-6 months or sooner if needed

## 2020-03-30 NOTE — Progress Notes (Signed)
Follow-up Note  RE: James Christian Weil MRN: [id number] DOB: 1995/11/05 Date of Office Visit: 03/30/2020   History of present illness: James Matthews is a 24 y.o. male presenting today for follow-up of asthma, allergies and food allergy.  He was last seen in the office on 12/09/2019 by myself.  Since the last visit he did develop a UTI in was septic and hospitalized.  I was contacted by his nephrologist with the thought by his Trelegy may be contributing to his underlying kidney issues.  Trelegy does have an anticholinergic based component that could have some impact on urinary retention.  Due to this potential concern I thought it would be best that we change him from Trelegy.  We went back to [facility] and added Arnuity for additional inhaled steroid control since this was a stepdown from Trelegy.  He is doing Nucala monthly and states the injections are going well.  Today he states his asthma has been doing well.  He is very happy at this time.  He has not needed to use his albuterol in the past month which is a big improvement.  He has not had any ED or urgent care visits in regards to his asthma since the last visit. He however does report increase in allergy symptoms over the past 3-4 weeks with more nasal congestion and sneezing.  He is taking zyrtec nightly and has been taking for the past 2 weeks.  Using flonase which helps with his congestion. He continues to avoid nuts and has not had any accidental ingestions or need to use his epinephrine device   Review of systems: Review of Systems  Constitutional: Negative.   HENT:       See HPI  Eyes: Negative.   Respiratory: Negative.   Cardiovascular: Negative.   Gastrointestinal: Negative.   Musculoskeletal: Negative.   Skin: Negative.   Neurological: Negative.     All other systems negative unless noted above in HPI  Past medical/social/surgical/family history have been reviewed and are unchanged unless specifically indicated  below.  No changes  Medication List: Current Outpatient Medications  Medication Sig Dispense Refill  . ALPRAZolam (XANAX) 1 MG tablet Take 1 mg by mouth 2 (two) times daily as needed for anxiety.     . ARNUITY ELLIPTA 100 MCG/ACT AEPB INHALE 1 PUFF INTO THE LUNGS EVERY DAY 30 each 3  . busPIRone (BUSPAR) 30 MG tablet Take 1 tablet (30 mg total) by mouth 2 (two) times daily. 180 tablet 1  . Desvenlafaxine Succinate (PRISTIQ PO) Take 150 mg by mouth. Take 1 tablet in the morning    . Erenumab-aooe (AIMOVIG) 140 MG/ML SOAJ Inject 140 mg into the skin every 30 (thirty) days. 1 pen 11  . fluticasone furoate-vilanterol (BREO ELLIPTA) 200-25 MCG/INH AEPB Inhale 1 puff into the lungs daily. 31 each 1  . levalbuterol (XOPENEX HFA) 45 MCG/ACT inhaler Inhale 2 puffs into the lungs every 4 (four) hours as needed for wheezing. 1 Inhaler 12  . losartan (COZAAR) 100 MG tablet Take 1 tablet by mouth every evening.     . Mepolizumab (NUCALA) 100 MG/ML SOAJ Inject 100 mg into the skin every 30 (thirty) days.    Marland Kitchen MOTEGRITY 2 MG TABS Take 1 tablet by mouth at bedtime.    . ondansetron (ZOFRAN-ODT) 8 MG disintegrating tablet TAKE 1 TABLET (8 MG TOTAL) BY MOUTH EVERY 8 (EIGHT) HOURS AS NEEDED FOR NAUSEA OR VOMITING. 20 tablet 3  . pantoprazole (PROTONIX) 40 MG tablet Take  by mouth.    . prazosin (MINIPRESS) 1 MG capsule Take 1 mg by mouth at bedtime.    . prazosin (MINIPRESS) 2 MG capsule Take 2 mg by mouth at bedtime.    . rizatriptan (MAXALT-MLT) 10 MG disintegrating tablet TAKE 1 TABLET EARLIEST ONSET OF MIGRAINE. MAY REPEAT IN 2 HRS IF NEEDED. MAX 2 TABLETS IN 24 HRS 6 tablet 0   Current Facility-Administered Medications  Medication Dose Route Frequency Provider Last Rate Last Admin  . Mepolizumab SOLR 100 mg  100 mg Subcutaneous Q28 days Marcelyn Bruins, MD   100 mg at 01/12/20 1725     Known medication allergies: Allergies  Allergen Reactions  . Albuterol     High HR  . Chocolate      Face swelling/sounds like angioedema "I had to go to urgent care today due to an allergic reaction to a chocolate candy bar. My throat swelled up and was closing, my nasal passages were congested, and my face was hurting. They gave me a steroid shot at the clinic, and then they sent me home presidone to take for a week" 06/2019  . Lisinopril Other (See Comments)  . Other Other (See Comments)    Pain med? Unsure of medication, caused n/v     Physical examination: Blood pressure 130/70, pulse 96, temperature 98.6 F (37 C), temperature source Temporal, resp. rate 18, SpO2 98 %.  General: Alert, interactive, in no acute distress. HEENT: PERRLA, TMs pearly gray, turbinates mildly edematous without discharge, post-pharynx non erythematous. Neck: Supple without lymphadenopathy. Lungs: Clear to auscultation without wheezing, rhonchi or rales. {no increased work of breathing. CV: Normal S1, S2 without murmurs. Abdomen: Nondistended, nontender. Skin: Warm and dry, without lesions or rashes. Extremities:  No clubbing, cyanosis or edema. Neuro:   Grossly intact.  Diagnositics/Labs: None today  Assessment and plan: Severe persistent asthma  - have access to levalbuterol inhaler 2 puffs every 4-6 hours as needed for cough/wheeze/shortness of breath/chest tightness.  May use 15-20 minutes prior to activity.   Monitor frequency of use.    - continue Breo 1 puff daily and Arnuity 1 puff daily at this time.   If you continue to be under good control will try to wean inhalers down  - continue monthly Nucala injection self-administered at home  - at this time control is improved  Asthma control goals:   Full participation in all desired activities (may need albuterol before activity)  Albuterol use two time or less a week on average (not counting use with activity)  Cough interfering with sleep two time or less a month  Oral steroids no more than once a year  No  hospitalizations  Allergic rhinitis with conjunctivitis - continue avoidance measures for grass pollens, weed pollens, tree pollens, molds, dust mite, cat, mouse, mixed feathers - continue Zyrtec 10mg  daily thru fall (weed pollen season ends at the first frost)   - for watery eyes can use of Pataday 1 drop each eye daily as needed - for nasal congestion continue use of nasal steroid spray like Flonase.  Best used for 1-2 weeks at a time for maximum effect - if medication management is not effective enough then consider allergen immunotherapy (allergy shots) however your asthma needs to be under good control before considering this option.  Allergic reaction/anaphylaxis due to food  - continue avoidance of tree nuts  - have access to self-injectable epinephrine AuviQ 0.3mg  at all times  - follow emergency action plan in  case of allergic reaction  Follow-up in 4-6 months or sooner if needed  I appreciate the opportunity to take part in Jerimy's care. Please do not hesitate to contact me with questions.  Sincerely,   Margo Aye, MD Allergy/Immunology [facility]

## 2020-03-31 ENCOUNTER — Other Ambulatory Visit: Payer: Self-pay | Admitting: Neurology

## 2020-03-31 ENCOUNTER — Ambulatory Visit: Payer: BC Managed Care – PPO | Admitting: Neurology

## 2020-03-31 ENCOUNTER — Encounter: Payer: Self-pay | Admitting: Neurology

## 2020-03-31 VITALS — BP 129/78 | HR 112 | Ht 73.0 in | Wt 168.8 lb

## 2020-03-31 DIAGNOSIS — G43009 Migraine without aura, not intractable, without status migrainosus: Secondary | ICD-10-CM | POA: Diagnosis not present

## 2020-03-31 NOTE — Patient Instructions (Signed)
  1. Aimovig 140mg  monthly 2. Take rizatriptan 10mg  at earliest onset of headache.  May repeat dose once in 2 hours if needed.  Maximum 2 tablets in 24 hours. 3. Limit use of pain relievers to no more than 2 days out of the week.  These medications include acetaminophen, NSAIDs (ibuprofen/Advil/Motrin, naproxen/Aleve, triptans (Imitrex/sumatriptan), Excedrin, and narcotics.  This will help reduce risk of rebound headaches. 4. Be aware of common food triggers:  - Caffeine:  coffee, black tea, cola, Mt. Dew  - Chocolate  - Dairy:  aged cheeses (brie, blue, cheddar, gouda, Beatrice, provolone, Boston, Swiss, etc), chocolate milk, buttermilk, sour cream, limit eggs and yogurt  - Nuts, peanut butter  - Alcohol  - Cereals/grains:  FRESH breads (fresh bagels, sourdough, doughnuts), yeast productions  - Processed/canned/aged/cured meats (pre-packaged deli meats, hotdogs)  - MSG/glutamate:  soy sauce, flavor enhancer, pickled/preserved/marinated foods  - Sweeteners:  aspartame (Equal, Nutrasweet).  Sugar and Splenda are okay  - Vegetables:  legumes (lima beans, lentils, snow peas, fava beans, pinto peans, peas, garbanzo beans), sauerkraut, onions, olives, pickles  - Fruit:  avocados, bananas, citrus fruit (orange, lemon, grapefruit), mango  - Other:  Frozen meals, macaroni and cheese 5. Routine exercise 6. Stay adequately hydrated (aim for 64 oz water daily) 7. Keep headache diary 8. Maintain proper stress management 9. Maintain proper sleep hygiene 10. Do not skip meals 11. Consider supplements:  magnesium citrate 400mg  daily, riboflavin 400mg  daily, coenzyme Q10 100mg  three times daily.

## 2020-04-02 ENCOUNTER — Other Ambulatory Visit: Payer: Self-pay | Admitting: Allergy

## 2020-04-04 ENCOUNTER — Ambulatory Visit (INDEPENDENT_AMBULATORY_CARE_PROVIDER_SITE_OTHER): Payer: BC Managed Care – PPO | Admitting: Clinical

## 2020-04-04 DIAGNOSIS — F331 Major depressive disorder, recurrent, moderate: Secondary | ICD-10-CM

## 2020-04-05 DIAGNOSIS — F4312 Post-traumatic stress disorder, chronic: Secondary | ICD-10-CM | POA: Diagnosis not present

## 2020-04-09 DIAGNOSIS — F4312 Post-traumatic stress disorder, chronic: Secondary | ICD-10-CM | POA: Diagnosis not present

## 2020-04-09 DIAGNOSIS — F411 Generalized anxiety disorder: Secondary | ICD-10-CM | POA: Diagnosis not present

## 2020-04-09 DIAGNOSIS — F330 Major depressive disorder, recurrent, mild: Secondary | ICD-10-CM | POA: Diagnosis not present

## 2020-04-09 DIAGNOSIS — R482 Apraxia: Secondary | ICD-10-CM | POA: Diagnosis not present

## 2020-04-11 ENCOUNTER — Ambulatory Visit (INDEPENDENT_AMBULATORY_CARE_PROVIDER_SITE_OTHER): Payer: BC Managed Care – PPO | Admitting: Clinical

## 2020-04-11 DIAGNOSIS — F331 Major depressive disorder, recurrent, moderate: Secondary | ICD-10-CM | POA: Diagnosis not present

## 2020-04-12 DIAGNOSIS — F4312 Post-traumatic stress disorder, chronic: Secondary | ICD-10-CM | POA: Diagnosis not present

## 2020-04-18 ENCOUNTER — Ambulatory Visit (INDEPENDENT_AMBULATORY_CARE_PROVIDER_SITE_OTHER): Payer: BC Managed Care – PPO | Admitting: Clinical

## 2020-04-18 DIAGNOSIS — F331 Major depressive disorder, recurrent, moderate: Secondary | ICD-10-CM | POA: Diagnosis not present

## 2020-04-19 DIAGNOSIS — F4312 Post-traumatic stress disorder, chronic: Secondary | ICD-10-CM | POA: Diagnosis not present

## 2020-04-25 ENCOUNTER — Ambulatory Visit (INDEPENDENT_AMBULATORY_CARE_PROVIDER_SITE_OTHER): Payer: BC Managed Care – PPO | Admitting: Clinical

## 2020-04-25 DIAGNOSIS — F331 Major depressive disorder, recurrent, moderate: Secondary | ICD-10-CM | POA: Diagnosis not present

## 2020-04-26 DIAGNOSIS — F4312 Post-traumatic stress disorder, chronic: Secondary | ICD-10-CM | POA: Diagnosis not present

## 2020-05-02 ENCOUNTER — Ambulatory Visit: Payer: BC Managed Care – PPO | Admitting: Clinical

## 2020-05-03 DIAGNOSIS — F4312 Post-traumatic stress disorder, chronic: Secondary | ICD-10-CM | POA: Diagnosis not present

## 2020-05-05 ENCOUNTER — Other Ambulatory Visit: Payer: Self-pay

## 2020-05-05 ENCOUNTER — Telehealth: Payer: Self-pay

## 2020-05-05 MED ORDER — PREDNISONE 20 MG PO TABS: 20.0000 mg | ORAL_TABLET | Freq: Two times a day (BID) | ORAL | 0 refills | Status: AC

## 2020-05-05 NOTE — Telephone Encounter (Signed)
He does his Nucala around the beginning of the month?  If so he is almost due for for his next Nucala injection.   If he continues to have an increase in symptoms in the week before he is due for new Calla then we may be able to consider doing it every 3 weeks instead but for now would stay on the monthly schedule.  For this current flareup he should continue taking Breo and Arnuity daily as he is doing at this time.  Recommend he do a 5-day prednisone 20 mg twice a day burst at this time to help get this current flare under control.  If he is no better after prednisone use then we can schedule an appointment for next week.

## 2020-05-05 NOTE — Telephone Encounter (Signed)
Receive a call from patient and he states he is having an asthma flare up. He states he started having a lot of shortness of breath 3 days ago and has been having to use his rescue inhaler 3-4 times per day. He complains of chest tightness as well.  He states he did have some wheezing yesterday but not today. He has been doing Engineer, materials and Arnuity 1 puff daily. He wants to know what else can he do since he is not feeling any better. Please advice. Thank you

## 2020-05-05 NOTE — Telephone Encounter (Signed)
I spoke to James Matthews again this afternoon and informed him that we are sending in the prednisone to his pharmacy and your recommendations. He will call us back with an update on how he is doing next week. He also states he does his Nucala injecton on the 26th day of each month.

## 2020-05-06 ENCOUNTER — Emergency Department (HOSPITAL_COMMUNITY)
Admission: EM | Admit: 2020-05-06 | Discharge: 2020-05-06 | Disposition: A | Discharge status: Home / Self Care | Payer: BC Managed Care – PPO | Attending: Emergency Medicine | Admitting: Emergency Medicine

## 2020-05-06 ENCOUNTER — Other Ambulatory Visit: Payer: Self-pay

## 2020-05-06 ENCOUNTER — Encounter (HOSPITAL_COMMUNITY): Payer: Self-pay | Admitting: Emergency Medicine

## 2020-05-06 ENCOUNTER — Emergency Department (HOSPITAL_COMMUNITY): Payer: BC Managed Care – PPO

## 2020-05-06 ENCOUNTER — Telehealth: Payer: Self-pay

## 2020-05-06 DIAGNOSIS — J4541 Moderate persistent asthma with (acute) exacerbation: Secondary | ICD-10-CM

## 2020-05-06 DIAGNOSIS — N183 Chronic kidney disease, stage 3 unspecified: Secondary | ICD-10-CM | POA: Diagnosis not present

## 2020-05-06 DIAGNOSIS — I129 Hypertensive chronic kidney disease with stage 1 through stage 4 chronic kidney disease, or unspecified chronic kidney disease: Secondary | ICD-10-CM | POA: Diagnosis not present

## 2020-05-06 DIAGNOSIS — R55 Syncope and collapse: Secondary | ICD-10-CM | POA: Insufficient documentation

## 2020-05-06 DIAGNOSIS — J45909 Unspecified asthma, uncomplicated: Secondary | ICD-10-CM | POA: Diagnosis not present

## 2020-05-06 LAB — CBC
HCT: 42 % (ref 39.0–52.0)
Hemoglobin: 14.8 g/dL (ref 13.0–17.0)
MCH: 32.9 pg (ref 26.0–34.0)
MCHC: 35.2 g/dL (ref 30.0–36.0)
MCV: 93.3 fL (ref 80.0–100.0)
Platelets: 241 10*3/uL (ref 150–400)
RBC: 4.5 MIL/uL (ref 4.22–5.81)
RDW: 11.6 % (ref 11.5–15.5)
WBC: 7.5 10*3/uL (ref 4.0–10.5)
nRBC: 0 % (ref 0.0–0.2)

## 2020-05-06 LAB — URINALYSIS, ROUTINE W REFLEX MICROSCOPIC
Bilirubin Urine: NEGATIVE
Glucose, UA: NEGATIVE mg/dL
Hgb urine dipstick: NEGATIVE
Ketones, ur: NEGATIVE mg/dL
Leukocytes,Ua: NEGATIVE
Nitrite: NEGATIVE
Protein, ur: NEGATIVE mg/dL
Specific Gravity, Urine: 1.006 (ref 1.005–1.030)
pH: 7 (ref 5.0–8.0)

## 2020-05-06 LAB — CBG MONITORING, ED: Glucose-Capillary: 94 mg/dL (ref 70–99)

## 2020-05-06 LAB — BASIC METABOLIC PANEL
Anion gap: 10 (ref 5–15)
BUN: 15 mg/dL (ref 6–20)
CO2: 28 mmol/L (ref 22–32)
Calcium: 10.1 mg/dL (ref 8.9–10.3)
Chloride: 102 mmol/L (ref 98–111)
Creatinine, Ser: 1.11 mg/dL (ref 0.61–1.24)
GFR, Estimated: >60 mL/min (ref >60)
Glucose, Bld: 103 mg/dL — ABNORMAL HIGH (ref 70–99)
Potassium: 4 mmol/L (ref 3.5–5.1)
Sodium: 140 mmol/L (ref 135–145)

## 2020-05-06 LAB — RAPID URINE DRUG SCREEN, HOSP PERFORMED
Amphetamines: NOT DETECTED
Barbiturates: NOT DETECTED
Benzodiazepines: POSITIVE — AB
Cocaine: NOT DETECTED
Opiates: NOT DETECTED
Tetrahydrocannabinol: NOT DETECTED

## 2020-05-06 LAB — TROPONIN I (HIGH SENSITIVITY): Troponin I (High Sensitivity): 3 ng/L (ref <18)

## 2020-05-06 MED ORDER — PREDNISONE 20 MG PO TABS
60.0000 mg | ORAL_TABLET | Freq: Once | ORAL | Status: AC
  Administered 2020-05-06: 60 mg via ORAL
  Filled 2020-05-06: qty 3

## 2020-05-06 NOTE — ED Triage Notes (Signed)
Patient states he was having an asthma flare up yesterday and had two episodes of syncope last night, witnessed by mother. Endorses nausea, denies CP but states chest tightness. Used rescue inhalers for some relief. Able to speak w/o SOB, no WOB noted, 99% RA. PCP recommended he come to the ED.

## 2020-05-06 NOTE — Telephone Encounter (Signed)
Patient called and stated that last night, he had passed out twice due to what he believed was his asthma. He stated that his mom had called him to come to her room and as he was walking down the hallway he felt short of breath, and the lights were getting dimmer. He then stated that he woke up on his moms bed from where he passed out. He passed out a second time as well.   Verbally spoke with Dr. Delorse Lek and she stated that James Matthews would need to go to the ER to be evaluated. Patient informed and stated he would speak to his mom about going to the ER for an evaluation.

## 2020-05-06 NOTE — Discharge Instructions (Addendum)
Take the prednisone prescription your doctor wrote for you. Take the first pill tomorrow as you already had a pill here in the Emergency department.  You chest xray did not show any signs of infection and your blood work was all normal.  -A referral has been sent to the cardiology clinic.  They should be calling you within 1 week to schedule an appointment.  The office number is listed in the paperwork so you can call them if you do not hear from the office.   Recommend you follow-up with your primary care doctor.  Thank you for allowing [place] to care for you today.

## 2020-05-06 NOTE — ED Notes (Signed)
Pt currently in CT/X-ray.

## 2020-05-06 NOTE — ED Notes (Signed)
O2 sats stayed at 99%-100% while ambulating around room. Pt does complain of some shortness of breath while walking.

## 2020-05-06 NOTE — ED Provider Notes (Addendum)
[facility]-EMERGENCY DEPT Provider Note   CSN: [id number] Arrival date & time: 05/06/20  1409     History Chief Complaint  Patient presents with  . Loss of Consciousness  . Asthma    James Matthews is a 24 y.o. male with past medical history significant for eosinophilic asthma, hypertension, IBS, verbal apraxia, mesenteric lymphadenopathy.  HPI Patient presents to emergency department today with chief complaint of loss of consciousness and asthma exacerbation x1 day.  Patient states last night he was having an asthma flareup and had two episodes of near syncope witnessed by his mother. Patient states typically the change in weather causes his asthma to act up.   Patient states he noticed he was started to have an asthma exacerbation and was wheezing so he called his primary care doctor yesterday.  She called in some prednisone for him.  He was unfortunately not able to make it to the pharmacy in time to pick up the medication.  He states he was walking to his mother's bedroom to tell her he was feeling poorly and while he was walking he felt lightheaded and was having wheezing and chest tightness.  He leaned against the wall and does not remember what happened next.  His mother states he slowly slid down the wall.  He did not hit his head.  He did not have any full body shaking, loss of bowel or bladder function, did not bite his tongue.  Mother states she was able to help him up and walk him to his bed.  She states he seemed out of it, his eyes were open while this was going on.  She gave him his rescue inhaler and he returned to baseline.  He states the same thing happened 1 more time throughout the night.  Patient called his PCP to let them know of the events and it was recommended he come to the emergency department for evaluation.  Patient went to urgent care prior to arrival and was advised to seek evaluation in the emergency department. He admits to family  history of heart disease stating grandfather had a heart attack in his 30s, no history of HOCM. He denies any recent illness or medication changes. He denies fever, chills, cough, palpitations, diaphoresis, abdominal pain, nausea, emesis, back pain, urinary symptoms, diarrhea, bloody stool.      Past Medical History:  Diagnosis Date  . Asthma   . Hypertension   . IBS (irritable bowel syndrome)    probiotic  . Posterior urethral valves   . Premature birth    3 months early  . Renal disorder   . Verbal apraxia     Patient Active Problem List   Diagnosis Date Noted  . PTSD (post-traumatic stress disorder) 10/19/2019  . Daily headache 06/02/2019  . Asthma, severe persistent 05/25/2019  . Social anxiety disorder 05/04/2018  . Insomnia 05/04/2018  . Mesenteric lymphadenopathy 07/27/2016  . Posterior urethral valves (PUV) 06/04/2016  . GERD (gastroesophageal reflux disease) 06/04/2016  . Essential hypertension 06/04/2016  . Bladder spasms 06/04/2016  . GAD (generalized anxiety disorder) 06/04/2016  . CKD (chronic kidney disease), stage II 06/04/2016  . Major depression, recurrent (HCC) 06/04/2016  . Premature birth   . IBS (irritable bowel syndrome)   . Verbal apraxia     Past Surgical History:  Procedure Laterality Date  . ADENOIDECTOMY    . ESOPHAGEAL MANOMETRY N/A 01/23/2016   Procedure: ESOPHAGEAL MANOMETRY (EM);  Surgeon: Charna Elizabeth, MD;  Location: [facility] ENDOSCOPY;  Service: Endoscopy;  Laterality: N/A;  . LASIK     eye surgery july 2017  . NEPHRECTOMY     rght   . TONSILLECTOMY         Family History  Problem Relation Age of Onset  . Breast cancer Mother        survivor  . Hypertension Mother   . Hyperlipidemia Mother   . Other Father        pituitary tumor- gamma knife radiation  . Heart disease Maternal Grandfather   . Prostate cancer Maternal Grandfather        73s  . Melanoma Sister   . Prostate cancer Paternal Grandfather        41s  . Ovarian cancer  Maternal Aunt        mets to liver     Social History   Tobacco Use  . Smoking status: Never Smoker  . Smokeless tobacco: Never Used  Vaping Use  . Vaping Use: Never assessed  Substance Use Topics  . Alcohol use: No  . Drug use: No    Home Medications Prior to Admission medications   Medication Sig Start Date End Date Taking? Authorizing Provider  ALPRAZolam Prudy Feeler) 1 MG tablet Take 1 mg by mouth 2 (two) times daily as needed for anxiety.     [provider]  ARNUITY ELLIPTA 100 MCG/ACT AEPB INHALE 1 PUFF INTO THE LUNGS EVERY DAY 03/21/20   Marcelyn Bruins, MD  BREO ELLIPTA 200-25 MCG/INH AEPB INHALE 1 PUFF INTO THE LUNGS EVERY DAY 04/04/20   Marcelyn Bruins, MD  busPIRone (BUSPAR) 30 MG tablet Take 1 tablet (30 mg total) by mouth 2 (two) times daily. 10/19/19   Shelva Majestic, MD  Desvenlafaxine Succinate (PRISTIQ PO) Take 150 mg by mouth. Take 1 tablet in the morning    [provider]  Erenumab-aooe (AIMOVIG) 140 MG/ML SOAJ Inject 140 mg into the skin every 30 (thirty) days. 11/17/19   Drema Dallas, DO  levalbuterol (XOPENEX HFA) 45 MCG/ACT inhaler Inhale 2 puffs into the lungs every 4 (four) hours as needed for wheezing. 07/28/19   Shelva Majestic, MD  losartan (COZAAR) 100 MG tablet Take 1 tablet by mouth every evening.     [provider]  Mepolizumab (NUCALA) 100 MG/ML SOAJ Inject 100 mg into the skin every 30 (thirty) days.    [provider]  MOTEGRITY 2 MG TABS Take 1 tablet by mouth at bedtime. 10/27/19   [provider]  ondansetron (ZOFRAN-ODT) 8 MG disintegrating tablet TAKE 1 TABLET (8 MG TOTAL) BY MOUTH EVERY 8 (EIGHT) HOURS AS NEEDED FOR NAUSEA OR VOMITING. 12/17/19   Everlena Cooper, Adam R, DO  pantoprazole (PROTONIX) 40 MG tablet Take by mouth. 08/17/19   [provider]  prazosin (MINIPRESS) 1 MG capsule Take 1 mg by mouth at bedtime. 02/18/20   [provider]  prazosin (MINIPRESS) 2 MG capsule  Take 2 mg by mouth at bedtime. 03/03/20   [provider]  predniSONE (DELTASONE) 20 MG tablet Take 1 tablet (20 mg total) by mouth 2 (two) times daily for 5 days. 05/05/20 05/10/20  Marcelyn Bruins, MD  rizatriptan (MAXALT-MLT) 10 MG disintegrating tablet TAKE 1 TABLET EARLIEST ONSET OF MIGRAINE. MAY REPEAT IN 2 HRS IF NEEDED. MAX 2 TABLETS IN 24 HRS 03/31/20   Shon Millet R, DO    Allergies    Albuterol, Chocolate, Lisinopril, and Other  Review of Systems   Review of Systems  All other systems are reviewed and are negative for acute change except as noted in the HPI.  Physical Exam Updated Vital Signs BP (!) 143/87 (BP Location: Left Arm)   Pulse 98   Temp 98 F (36.7 C) (Oral)   Resp (!) 21   Ht 6\' 1"  (1.854 m)   Wt 74.8 kg   SpO2 98%   BMI 21.77 kg/m   Physical Exam Vitals and nursing note reviewed.  Constitutional:      General: He is not in acute distress.    Appearance: He is not ill-appearing.     Comments: Patient has verbal ataxia. Speech is baseline per patient and mother at the bedside  HENT:     Head: Normocephalic and atraumatic.     Right Ear: Tympanic membrane and external ear normal.     Left Ear: Tympanic membrane and external ear normal.     Nose: Nose normal.     Mouth/Throat:     Mouth: Mucous membranes are moist.     Pharynx: Oropharynx is clear.  Eyes:     General: No scleral icterus.       Right eye: No discharge.        Left eye: No discharge.     Extraocular Movements: Extraocular movements intact.     Conjunctiva/sclera: Conjunctivae normal.     Pupils: Pupils are equal, round, and reactive to light.  Neck:     Vascular: No JVD.  Cardiovascular:     Rate and Rhythm: Normal rate and regular rhythm.     Pulses: Normal pulses.          Radial pulses are 2+ on the right side and 2+ on the left side.       Dorsalis pedis pulses are 2+ on the right side and 2+ on the left side.     Heart sounds: Normal heart sounds. No murmur  heard.   Pulmonary:     Comments: Lungs clear to auscultation in all fields. Symmetric chest rise. No wheezing, rales, or rhonchi. Oxygen saturation is 99% on room air. Normal work of breathing. Speaking in full sentences Abdominal:     Comments: Abdomen is soft, non-distended, and non-tender in all quadrants. No rigidity, no guarding. No peritoneal signs.  Musculoskeletal:        General: Normal range of motion.     Cervical back: Normal range of motion.     Right lower leg: No edema.     Left lower leg: No edema.  Skin:    General: Skin is warm and dry.     Capillary Refill: Capillary refill takes less than 2 seconds.  Neurological:     Mental Status: He is oriented to person, place, and time.     GCS: GCS eye subscore is 4. GCS verbal subscore is 5. GCS motor subscore is 6.     Comments: Fluent speech, no facial droop.  Psychiatric:        Behavior: Behavior normal.     ED Results / Procedures / Treatments   Labs (all labs ordered are listed, but only abnormal results are displayed) Labs Reviewed  BASIC METABOLIC PANEL - Abnormal; Notable for the following components:      Result Value   Glucose, Bld 103 (*)    All other components within normal limits  RAPID URINE DRUG SCREEN, HOSP PERFORMED - Abnormal; Notable for the following components:   Benzodiazepines POSITIVE (*)    All other components within normal limits  CBC  URINALYSIS, ROUTINE W REFLEX MICROSCOPIC  CBG MONITORING, ED  TROPONIN I (HIGH SENSITIVITY)    EKG EKG Interpretation  Date/Time:  Friday May 06 2020 14:17:20 EDT Ventricular Rate:  88 PR Interval:    QRS Duration: 99 QT Interval:  332 QTC Calculation: 402 R Axis:   79 Text Interpretation: Sinus rhythm Borderline short PR interval Probable left ventricular hypertrophy Abnormal T, consider ischemia, diffuse leads 12 Lead; Mason-Likar Confirmed by Vanetta Mulders [phone]) on 05/06/2020 4:54:19 PM   Radiology DG Chest 2 View  Result Date:  05/06/2020 CLINICAL DATA:  Asthma, syncope EXAM: CHEST - 2 VIEW COMPARISON:  2015 FINDINGS: The heart size and mediastinal contours are within normal limits. Both lungs are clear. No pleural effusion or pneumothorax. The visualized skeletal structures are unremarkable. IMPRESSION: No acute process in the chest. Electronically Signed   By: Guadlupe Spanish M.D.   On: 05/06/2020 15:00    Procedures Procedures (including critical care time)  Medications Ordered in ED Medications  predniSONE (DELTASONE) tablet 60 mg (60 mg Oral Given 05/06/20 1534)    ED Course  I have reviewed the triage vital signs and the nursing notes.  Pertinent labs & imaging results that were available during my care of the patient were reviewed by me and considered in my medical decision making (see chart for details).    MDM Rules/Calculators/A&P                          History provided by patient with additional history obtained from chart review.     Patient seen and examined. Patient presents awake, alert, hemodynamically stable, afebrile, non toxic. He is very well appearing on exam. Normal work of breathing with clear lung sounds. VSS.  CBG on arrival is 59. No murmur heard. History is suggestive of near syncope. He does not have any chest pain currently. No description of seizure like activity.   Lab work including CBC and BMP are unremarkable. Troponin also unremarkable at 3. UA is negative for infection. UDS is positive for benzos I viewed pt's chest xray and it does not suggest acute infectious processes EKG shows sinus rhythm, he has borderline short PR interval.  Most recent to compare is 5 years ago.  When PR interval was normal.  The EKG does not have delta waves, wide QRS, or Q waves.  R waves are seen. Given the borderline short PR waves and near syncope will send ambulatory cardiology referal after discussion with my supervising physician Dr. Deretha Emory  He is ambulatory with oxygen saturation >95% on  room air. Patient given PO prednisone. He already has prescription waiting at the pharmacy for a short burst he will pick up today. The patient appears reasonably screened and/or stabilized for discharge and I doubt any other medical condition or other [facility] requiring further screening, evaluation, or treatment in the ED at this time prior to discharge. The patient is safe for discharge with strict return precautions discussed. Recommend pcp follow up as well for symptom recheck.  Portions of this note were generated with Scientist, clinical (histocompatibility and immunogenetics). Dictation errors may occur despite best attempts at proofreading.    Final Clinical Impression(s) / ED Diagnoses Final diagnoses:  Moderate persistent asthma with exacerbation  Near syncope    Rx / DC Orders ED Discharge Orders         Ordered    Ambulatory referral to Cardiology        05/06/20 1709  Sherene Sireslbrizze, Deontra Pereyra E, PA-C 05/06/20 1729    Sherene SiresAlbrizze, Dae Highley E, PA-C 05/06/20 1730    Vanetta MuldersZackowski, Scott, MD 05/06/20 2213

## 2020-05-09 ENCOUNTER — Ambulatory Visit (INDEPENDENT_AMBULATORY_CARE_PROVIDER_SITE_OTHER): Payer: BC Managed Care – PPO | Admitting: Clinical

## 2020-05-09 DIAGNOSIS — F331 Major depressive disorder, recurrent, moderate: Secondary | ICD-10-CM | POA: Diagnosis not present

## 2020-05-10 DIAGNOSIS — F4312 Post-traumatic stress disorder, chronic: Secondary | ICD-10-CM | POA: Diagnosis not present

## 2020-05-11 ENCOUNTER — Other Ambulatory Visit: Payer: Self-pay

## 2020-05-11 ENCOUNTER — Ambulatory Visit (INDEPENDENT_AMBULATORY_CARE_PROVIDER_SITE_OTHER): Payer: BC Managed Care – PPO | Admitting: Allergy

## 2020-05-11 ENCOUNTER — Encounter: Payer: Self-pay | Admitting: Allergy

## 2020-05-11 VITALS — BP 122/84 | HR 78 | Resp 16

## 2020-05-11 DIAGNOSIS — J4551 Severe persistent asthma with (acute) exacerbation: Secondary | ICD-10-CM | POA: Diagnosis not present

## 2020-05-11 DIAGNOSIS — H1013 Acute atopic conjunctivitis, bilateral: Secondary | ICD-10-CM

## 2020-05-11 DIAGNOSIS — J3089 Other allergic rhinitis: Secondary | ICD-10-CM | POA: Diagnosis not present

## 2020-05-11 DIAGNOSIS — T7800XD Anaphylactic reaction due to unspecified food, subsequent encounter: Secondary | ICD-10-CM

## 2020-05-11 DIAGNOSIS — T7800XA Anaphylactic reaction due to unspecified food, initial encounter: Secondary | ICD-10-CM

## 2020-05-11 MED ORDER — ARNUITY ELLIPTA 200 MCG/ACT IN AEPB: 1.0000 | INHALATION_SPRAY | Freq: Every day | RESPIRATORY_TRACT | 5 refills | Status: DC

## 2020-05-11 NOTE — Patient Instructions (Signed)
Asthma with current exacerbation  - have access to levalbuterol inhaler 2 puffs every 4-6 hours as needed for cough/wheeze/shortness of breath/chest tightness.  May use 15-20 minutes prior to activity.   Monitor frequency of use.    - continue Breo 1 puff daily and increase to Arnuity 1 puff daily at this time.   I once under good control will try to wean inhalers down  - continue monthly Nucala injection self-administered at home.  If he has another asthma exacerbation then will need to consider a different option like Fasenra.   Asthma control goals:   Full participation in all desired activities (may need albuterol before activity)  Albuterol use two time or less a week on average (not counting use with activity)  Cough interfering with sleep two time or less a month  Oral steroids no more than once a year  No hospitalizations  Allergies - continue avoidance measures for grass pollens, weed pollens, tree pollens, molds, dust mite, cat, mouse, mixed feathers - continue Zyrtec 10mg  daily thru fall (weed pollen season ends at the first frost)   - for watery eyes can use of Pataday 1 drop each eye daily as needed - for nasal congestion use of nasal steroid spray like Flonase no more than 3 times a week due to nosebleeds.  Provided with nasal saline gel to help moisturize the nose a bit better to decrease risk of nosebleeds. - if medication management is not effective enough then consider allergen immunotherapy (allergy shots) and will be eligible once asthma is under good control before considering this option.  Allergic reaction  - continue avoidance of tree nuts  - have access to self-injectable epinephrine AuviQ 0.3mg  at all times  - follow emergency action plan in case of allergic reaction  Follow-up in 3 months or sooner if needed

## 2020-05-11 NOTE — Progress Notes (Signed)
Follow-up Note  RE: James Christian Rubens MRN: [id number] DOB: 02-28-1996 Date of Office Visit: 05/11/2020   History of present illness: James Matthews is a 24 y.o. male presenting today for follow-up of recent asthma exacerbation.  He was seen in the ED on 05/06/2020 after he states he had near syncopal events.  His mother was at home but did not witness these events but however he states he would become quite lightheaded like he could pass out.  He states this is the time of year where he would have a lots of asthma flares previously.  The changes in the weather he thinks is what has been triggering current symptoms.  He also has been having cough, shortness of breath and chest tightness.  He did call the office on Thursday with symptoms and I did send in a prescription for prednisone from the start of this exacerbation however he states he did not make it in time to the pharmacy that night to get started.  In the ED he did receive 60 mg of prednisone.  He was able to pick up the prescription and has 1 more day left at this time.  He does feel better however he states he does have some chest tightness and shortness of breath.  His exam in the ED was otherwise unremarkable.  He did have a EKG however that showed sinus rhythm borderline short PR interval probable left ventricular hypertrophy, abnormal T.  He states he was referred to cardiology and has an appointment for 05/27/2020. He continues to do his Breo 200 mcg 1 puff once a day and Arnuity 100 mg 1 puff once a day.  He also does new Calla once a month and he typically does his self injection around the 26th of the month. He also states he has been having nosebleeds recently.  He has been using his Flonase but that stopped over the past couple of days.  He does continue to take Zyrtec. He does continue to avoid tree nuts and have access to his epinephrine device.   Review of systems: Review of Systems  Constitutional: Negative.     HENT: Positive for nosebleeds.   Eyes: Negative.   Respiratory: Positive for cough, shortness of breath and wheezing.   Cardiovascular: Negative.   Gastrointestinal: Negative.   Musculoskeletal: Negative.   Skin: Negative.   Neurological:       See HPI    All other systems negative unless noted above in HPI  Past medical/social/surgical/family history have been reviewed and are unchanged unless specifically indicated below.  No changes  Medication List: Current Outpatient Medications  Medication Sig Dispense Refill  . ALPRAZolam (XANAX) 1 MG tablet Take 1 mg by mouth 2 (two) times daily as needed for anxiety.     Marland Kitchen BREO ELLIPTA 200-25 MCG/INH AEPB INHALE 1 PUFF INTO THE LUNGS EVERY DAY 60 each 5  . busPIRone (BUSPAR) 30 MG tablet Take 1 tablet (30 mg total) by mouth 2 (two) times daily. 180 tablet 1  . Desvenlafaxine Succinate (PRISTIQ PO) Take 150 mg by mouth. Take 1 tablet in the morning    . Erenumab-aooe (AIMOVIG) 140 MG/ML SOAJ Inject 140 mg into the skin every 30 (thirty) days. 1 pen 11  . levalbuterol (XOPENEX HFA) 45 MCG/ACT inhaler Inhale 2 puffs into the lungs every 4 (four) hours as needed for wheezing. 1 Inhaler 12  . losartan (COZAAR) 100 MG tablet Take 1 tablet by mouth every evening.     Marland Kitchen  Mepolizumab (NUCALA) 100 MG/ML SOAJ Inject 100 mg into the skin every 30 (thirty) days.    Marland Kitchen MOTEGRITY 2 MG TABS Take 1 tablet by mouth at bedtime.    . ondansetron (ZOFRAN-ODT) 8 MG disintegrating tablet TAKE 1 TABLET (8 MG TOTAL) BY MOUTH EVERY 8 (EIGHT) HOURS AS NEEDED FOR NAUSEA OR VOMITING. 20 tablet 3  . prazosin (MINIPRESS) 1 MG capsule Take 1 mg by mouth at bedtime.    . prazosin (MINIPRESS) 2 MG capsule Take 2 mg by mouth at bedtime.    . rizatriptan (MAXALT-MLT) 10 MG disintegrating tablet TAKE 1 TABLET EARLIEST ONSET OF MIGRAINE. MAY REPEAT IN 2 HRS IF NEEDED. MAX 2 TABLETS IN 24 HRS 6 tablet 5  . Fluticasone Furoate (ARNUITY ELLIPTA) 200 MCG/ACT AEPB Inhale 1 puff into  the lungs daily. 1 each 5   Current Facility-Administered Medications  Medication Dose Route Frequency Provider Last Rate Last Admin  . Mepolizumab SOLR 100 mg  100 mg Subcutaneous Q28 days Marcelyn Bruins, MD   100 mg at 01/12/20 1725     Known medication allergies: Allergies  Allergen Reactions  . Albuterol     High HR  . Chocolate     Face swelling/sounds like angioedema "I had to go to urgent care today due to an allergic reaction to a chocolate candy bar. My throat swelled up and was closing, my nasal passages were congested, and my face was hurting. They gave me a steroid shot at the clinic, and then they sent me home presidone to take for a week" 06/2019  . Lisinopril Other (See Comments)  . Other Other (See Comments)    Pain med? Unsure of medication, caused n/v     Physical examination: Blood pressure 122/84, pulse 78, resp. rate 16, SpO2 98 %.  General: Alert, interactive, in no acute distress. HEENT: TMs pearly gray, turbinates minimally edematous with erythema without discharge, post-pharynx non erythematous. Neck: Supple without lymphadenopathy. Lungs: Clear to auscultation without wheezing, rhonchi or rales. {no increased work of breathing. CV: Normal S1, S2 without murmurs. Abdomen: Nondistended, nontender. Skin: Warm and dry, without lesions or rashes. Extremities:  No clubbing, cyanosis or edema. Neuro:   Grossly intact.  Diagnositics/Labs: None today  Assessment and plan:   Asthma with current exacerbation  - have access to levalbuterol inhaler 2 puffs every 4-6 hours as needed for cough/wheeze/shortness of breath/chest tightness.  May use 15-20 minutes prior to activity.   Monitor frequency of use.   - continue Breo 1 puff daily and increase to Arnuity 1 puff daily at this time.   I once under good control will try to wean inhalers down  - continue monthly Nucala injection self-administered at home.  If he has another asthma  exacerbation then will need to consider a different option like Fasenra.  -He will complete his prednisone course.   Asthma control goals:   Full participation in all desired activities (may need albuterol before activity)  Albuterol use two time or less a week on average (not counting use with activity)  Cough interfering with sleep two time or less a month  Oral steroids no more than once a year  No hospitalizations  Allergic rhinitis with conjunctivitis - continue avoidance measures for grass pollens, weed pollens, tree pollens, molds, dust mite, cat, mouse, mixed feathers - continue Zyrtec 10mg  daily thru fall (weed pollen season ends at the first frost)   - for watery eyes can use of Pataday 1 drop each  eye daily as needed - for nasal congestion use of nasal steroid spray like Flonase no more than 3 times a week due to nosebleeds.  Provided with nasal saline gel to help moisturize the nose a bit better to decrease risk of nosebleeds. - if medication management is not effective enough then consider allergen immunotherapy (allergy shots) and will be eligible once asthma is under good control before considering this option.  Anaphylaxis due to food  - continue avoidance of tree nuts  - have access to self-injectable epinephrine AuviQ 0.3mg  at all times  - follow emergency action plan in case of allergic reaction  Follow-up in 3 months or sooner if needed  I appreciate the opportunity to take part in Burrel's care. Please do not hesitate to contact me with questions.  Sincerely,   Margo Aye, MD Allergy/Immunology [facility]

## 2020-05-16 ENCOUNTER — Ambulatory Visit (INDEPENDENT_AMBULATORY_CARE_PROVIDER_SITE_OTHER): Payer: BC Managed Care – PPO | Admitting: Clinical

## 2020-05-16 DIAGNOSIS — F331 Major depressive disorder, recurrent, moderate: Secondary | ICD-10-CM | POA: Diagnosis not present

## 2020-05-17 DIAGNOSIS — F4312 Post-traumatic stress disorder, chronic: Secondary | ICD-10-CM | POA: Diagnosis not present

## 2020-05-19 DIAGNOSIS — R300 Dysuria: Secondary | ICD-10-CM | POA: Diagnosis not present

## 2020-05-19 DIAGNOSIS — R309 Painful micturition, unspecified: Secondary | ICD-10-CM | POA: Diagnosis not present

## 2020-05-21 DIAGNOSIS — F330 Major depressive disorder, recurrent, mild: Secondary | ICD-10-CM | POA: Diagnosis not present

## 2020-05-21 DIAGNOSIS — R482 Apraxia: Secondary | ICD-10-CM | POA: Diagnosis not present

## 2020-05-21 DIAGNOSIS — F411 Generalized anxiety disorder: Secondary | ICD-10-CM | POA: Diagnosis not present

## 2020-05-21 DIAGNOSIS — F4312 Post-traumatic stress disorder, chronic: Secondary | ICD-10-CM | POA: Diagnosis not present

## 2020-05-23 ENCOUNTER — Ambulatory Visit (INDEPENDENT_AMBULATORY_CARE_PROVIDER_SITE_OTHER): Payer: BC Managed Care – PPO | Admitting: Clinical

## 2020-05-23 DIAGNOSIS — F331 Major depressive disorder, recurrent, moderate: Secondary | ICD-10-CM | POA: Diagnosis not present

## 2020-05-24 DIAGNOSIS — F4312 Post-traumatic stress disorder, chronic: Secondary | ICD-10-CM | POA: Diagnosis not present

## 2020-05-27 ENCOUNTER — Other Ambulatory Visit: Payer: Self-pay

## 2020-05-27 ENCOUNTER — Telehealth: Payer: Self-pay | Admitting: Radiology

## 2020-05-27 ENCOUNTER — Ambulatory Visit: Payer: BC Managed Care – PPO | Admitting: Internal Medicine

## 2020-05-27 ENCOUNTER — Encounter: Payer: Self-pay | Admitting: Internal Medicine

## 2020-05-27 VITALS — BP 120/70 | HR 112 | Ht 73.0 in | Wt 174.0 lb

## 2020-05-27 DIAGNOSIS — I10 Essential (primary) hypertension: Secondary | ICD-10-CM

## 2020-05-27 DIAGNOSIS — R002 Palpitations: Secondary | ICD-10-CM | POA: Diagnosis not present

## 2020-05-27 DIAGNOSIS — R55 Syncope and collapse: Secondary | ICD-10-CM | POA: Diagnosis not present

## 2020-05-27 NOTE — Patient Instructions (Signed)
Medication Instructions:  Your physician recommends that you continue on your current medications as directed. Please refer to the Current Medication list given to you today.  *If you need a refill on your cardiac medications before your next appointment, please call your pharmacy*   Lab Work: None If you have labs (blood work) drawn today and your tests are completely normal, you will receive your results only by: Marland Kitchen MyChart Message (if you have MyChart) OR . A paper copy in the mail If you have any lab test that is abnormal or we need to change your treatment, we will call you to review the results.   Testing/Procedures: Christena Deem- Long Term Monitor Instructions   Your physician has requested you wear your ZIO patch monitor__14_____days.   This is a single patch monitor.  Irhythm supplies one patch monitor per enrollment.  Additional stickers are not available.   Please do not apply patch if you will be having a Nuclear Stress Test, Echocardiogram, Cardiac CT, MRI, or Chest Xray during the time frame you would be wearing the monitor. The patch cannot be worn during these tests.  You cannot remove and re-apply the ZIO XT patch monitor.   Your ZIO patch monitor will be sent USPS Priority mail from [facility] directly to your home address. The monitor may also be mailed to a PO BOX if home delivery is not available.   It may take 3-5 days to receive your monitor after you have been enrolled.   Once you have received you monitor, please review enclosed instructions.  Your monitor has already been registered assigning a specific monitor serial # to you.   Applying the monitor   Shave hair from upper left chest.   Hold abrader disc by orange tab.  Rub abrader in 40 strokes over left upper chest as indicated in your monitor instructions.   Clean area with 4 enclosed alcohol pads .  Use all pads to assure are is cleaned thoroughly.  Let dry.   Apply patch as indicated in monitor  instructions.  Patch will be place under collarbone on left side of chest with arrow pointing upward.   Rub patch adhesive wings for 2 minutes.Remove white label marked "1".  Remove white label marked "2".  Rub patch adhesive wings for 2 additional minutes.   While looking in a mirror, press and release button in center of patch.  A small green light will flash 3-4 times .  This will be your only indicator the monitor has been turned on.     Do not shower for the first 24 hours.  You may shower after the first 24 hours.   Press button if you feel a symptom. You will hear a small click.  Record Date, Time and Symptom in the Patient Log Book.   When you are ready to remove patch, follow instructions on last 2 pages of Patient Log Book.  Stick patch monitor onto last page of Patient Log Book.   Place Patient Log Book in [place] box.  Use locking tab on box and tape box closed securely.  The Orange and [employer] has [employer] on it.  Please place in mailbox as soon as possible.  Your physician should have your test results approximately 7 days after the monitor has been mailed back to [facility].   Call [facility] Customer Care at [phone] if you have questions regarding your ZIO XT patch monitor.  Call them immediately if you see an orange  light blinking on your monitor.   If your monitor falls off in less than 4 days contact our Monitor department at [phone].  If your monitor becomes loose or falls off after 4 days call [employer] at [phone] for suggestions on securing your monitor.     Follow-Up: At [facility], you and your health needs are our priority.  As part of our continuing mission to provide you with exceptional heart care, we have created designated Provider Care Teams.  These Care Teams include your primary Cardiologist (physician) and Advanced Practice Providers (APPs -  Physician Assistants and Nurse Practitioners) who all work together to provide you with  the care you need, when you need it.   Your next appointment:    You will see Dr Izora Ribas for a 3 month follow up appointment on September 02, 2020 at 1:20 PM.

## 2020-05-27 NOTE — Telephone Encounter (Signed)
Enrolled patient for a 14 day Zio XT Monitor to be mailed to patients home  

## 2020-05-27 NOTE — Progress Notes (Signed)
Cardiology Office Note:    Date:  05/27/2020   ID:  James Matthews, DOB August 06, 1995, MRN [id number]  PCP:  Shelva Majestic, MD  [facility] Cardiologist:  No primary care provider on file.  [facility] Electrophysiologist:  None   CC: Syncope Consulted for the evaluation of short PR interval at the behest of Shelva Majestic, MD  History of Present Illness:    James Matthews is a 24 y.o. male with a hx of HTN, CKD Stage II, eosinophilic moderate persistent asthma, Anxiety, Depression, and PTSD  who presents for syncope evaluation. Provider note:  Has a mild verbal apraxia  Patient notes that he has significant asthma.  He felt near syncopal during a severe exacerbation 05/06/20.   His mother notes that he has had breathing issues since the last year where he will get chest tightness.  No PND, or orthopnea.  Worse with exertion- was talking clothes out of the dryer, felt short of breath and felt chest tightness.  This chest tightness is worse with allergies (wheat and pollen).  Patient notes that he has passed out with needle sticks (once in college).  Felt lightheaded during the interval of question 05/06/20.  As a child, had syncopal event where he had lead in his fingers.  Mother notes he was shaking and his eyes rolled in the back of his head.  Lasted for 3 minutes. Patient notes palpitations. This happens both at rest and with exertion.  Happens daily.    Ambulatory BP 125/75.  Past Medical History:  Diagnosis Date  . Asthma   . Hypertension   . IBS (irritable bowel syndrome)    probiotic  . Posterior urethral valves   . Premature birth    3 months early  . Renal disorder   . Verbal apraxia     Past Surgical History:  Procedure Laterality Date  . ADENOIDECTOMY    . ESOPHAGEAL MANOMETRY N/A 01/23/2016   Procedure: ESOPHAGEAL MANOMETRY (EM);  Surgeon: Charna Elizabeth, MD;  Location: [facility];  Service: Endoscopy;  Laterality: N/A;  . LASIK     eye  surgery july 2017  . NEPHRECTOMY     rght   . TONSILLECTOMY      Current Medications: Current Meds  Medication Sig  . ALPRAZolam (XANAX) 1 MG tablet Take 1 mg by mouth 2 (two) times daily as needed for anxiety.   Marland Kitchen BREO ELLIPTA 200-25 MCG/INH AEPB INHALE 1 PUFF INTO THE LUNGS EVERY DAY  . busPIRone (BUSPAR) 30 MG tablet Take 1 tablet (30 mg total) by mouth 2 (two) times daily.  Marland Kitchen Desvenlafaxine Succinate (PRISTIQ PO) Take 150 mg by mouth. Take 1 tablet in the morning  . EPINEPHrine 0.3 mg/0.3 mL IJ SOAJ injection Inject into the muscle as needed.  Dorise Hiss (AIMOVIG) 140 MG/ML SOAJ Inject 140 mg into the skin every 30 (thirty) days.  . Fluticasone Furoate (ARNUITY ELLIPTA) 200 MCG/ACT AEPB Inhale 1 puff into the lungs daily.  Marland Kitchen levalbuterol (XOPENEX HFA) 45 MCG/ACT inhaler Inhale 2 puffs into the lungs every 4 (four) hours as needed for wheezing.  Marland Kitchen losartan (COZAAR) 100 MG tablet Take 1 tablet by mouth every evening.   . Mepolizumab (NUCALA) 100 MG/ML SOAJ Inject 100 mg into the skin every 30 (thirty) days.  Marland Kitchen MOTEGRITY 2 MG TABS Take 1 tablet by mouth at bedtime.  . ondansetron (ZOFRAN-ODT) 8 MG disintegrating tablet TAKE 1 TABLET (8 MG TOTAL) BY MOUTH EVERY 8 (EIGHT) HOURS AS NEEDED  FOR NAUSEA OR VOMITING.  . prazosin (MINIPRESS) 2 MG capsule Take 4 mg by mouth at bedtime.   . rizatriptan (MAXALT-MLT) 10 MG disintegrating tablet TAKE 1 TABLET EARLIEST ONSET OF MIGRAINE. MAY REPEAT IN 2 HRS IF NEEDED. MAX 2 TABLETS IN 24 HRS   Current Facility-Administered Medications for the 05/27/20 encounter (Office Visit) with Christell Constant, MD  Medication  . Mepolizumab SOLR 100 mg     Allergies:   Albuterol, Chocolate, Lisinopril, and Other   Social History   Socioeconomic History  . Marital status: Single    Spouse name: Not on file  . Number of children: Not on file  . Years of education: Not on file  . Highest education level: Not on file  Occupational History  . Not  on file  Tobacco Use  . Smoking status: Never Smoker  . Smokeless tobacco: Never Used  Vaping Use  . Vaping Use: Never assessed  Substance and Sexual Activity  . Alcohol use: No  . Drug use: No  . Sexual activity: Not on file  Other Topics Concern  . Not on file  Social History Narrative   Single. Lives with mom and dad. His brother and wife and nephew also live with him. Brother and sister- healthy.       Full time at [employer]. Studying psychology with biology minor.    Thinking grad school for psychology- would plan for research over clinical   Right handed   Two story home   Does drink caffeine, one large coffee daily   Social Determinants of Health   Financial Resource Strain:   . Difficulty of Paying Living Expenses: Not on file  Food Insecurity:   . Worried About Programme researcher, broadcasting/film/video in the Last Year: Not on file  . Ran Out of Food in the Last Year: Not on file  Transportation Needs:   . Lack of Transportation (Medical): Not on file  . Lack of Transportation (Non-Medical): Not on file  Physical Activity:   . Days of Exercise per Week: Not on file  . Minutes of Exercise per Session: Not on file  Stress:   . Feeling of Stress : Not on file  Social Connections:   . Frequency of Communication with Friends and Family: Not on file  . Frequency of Social Gatherings with Friends and Family: Not on file  . Attends Religious Services: Not on file  . Active Member of Clubs or Organizations: Not on file  . Attends Banker Meetings: Not on file  . Marital Status: Not on file    Family History: The patient's family history includes Breast cancer in his mother; Heart disease in his maternal grandfather; Hyperlipidemia in his mother; Hypertension in his mother; Melanoma in his sister; Other in his father; Ovarian cancer in his maternal aunt; Prostate cancer in his maternal grandfather and paternal grandfather. Grandfather had Head Disease NOS; died at age 78;  heart attacks in his 50s Aunt has had coronary artery disease No history of SCD, drownings or accidents. Brother has mitral valve proplapse  ROS:   Please see the history of present illness.    All other systems reviewed and are negative.  EKGs/Labs/Other Studies Reviewed:    The following studies were reviewed today:  EKG:   05/09/20  SR 88, LVH with repolarization  Recent Labs: 02/03/2020: ALT 22 05/06/2020: BUN 15; Creatinine, Ser 1.11; Hemoglobin 14.8; Platelets 241; Potassium 4.0; Sodium 140  Recent Lipid Panel  Component Value Date/Time   CHOL 119 05/25/2019 1410   TRIG 95.0 05/25/2019 1410   HDL 36.90 (L) 05/25/2019 1410   CHOLHDL 3 05/25/2019 1410   VLDL 19.0 05/25/2019 1410   LDLCALC 64 05/25/2019 1410    Risk Assessment/Calculations:     None  Physical Exam:    VS:  BP 120/70   Pulse (!) 112   Ht 6\' 1"  (1.854 m)   Wt 174 lb (78.9 kg)   SpO2 98%   BMI 22.96 kg/m     Wt Readings from Last 3 Encounters:  05/27/20 174 lb (78.9 kg)  05/06/20 165 lb (74.8 kg)  03/31/20 168 lb 12.8 oz (76.6 kg)    GEN: Well nourished, well developed in no acute distress HEENT: Normal NECK: No JVD; No carotid bruits LYMPHATICS: No lymphadenopathy CARDIAC: RRR, no murmurs, rubs, gallops RESPIRATORY:  Clear to auscultation without rales, wheezing or rhonchi  ABDOMEN: Soft, non-tender, non-distended MUSCULOSKELETAL:  No edema; No deformity  SKIN: Warm and dry NEUROLOGIC:  Alert and oriented x 3 PSYCHIATRIC:  Normal affect   ASSESSMENT:    1. Syncope, unspecified syncope type   2. Palpitations   3. Essential hypertension    PLAN:    Syncope With palpitations - Will get echocardiogram- will  - ZioPatch 14 day non-live  Essential Hypertension CK Stage II - ambulatory blood pressure 120/80, will start/continue ambulatory BP monitoring; gave education on how to perform ambulatory blood pressure monitoring including the frequency and technique; goal ambulatory  blood pressure < 135/85 on average - continue home medications  - discussed diet (DASH/low sodium), and exercise/weight loss interventions   Shared Decision Making/Informed Consent      Only as above  Medication Adjustments/Labs and Tests Ordered: Current medicines are reviewed at length with the patient today.  Concerns regarding medicines are outlined above.  Orders Placed This Encounter  Procedures  . LONG TERM MONITOR (3-14 DAYS)   No orders of the defined types were placed in this encounter.   Patient Instructions  Medication Instructions:  Your physician recommends that you continue on your current medications as directed. Please refer to the Current Medication list given to you today.  *If you need a refill on your cardiac medications before your next appointment, please call your pharmacy*   Lab Work: None If you have labs (blood work) drawn today and your tests are completely normal, you will receive your results only by: 04/02/20 MyChart Message (if you have MyChart) OR . A paper copy in the mail If you have any lab test that is abnormal or we need to change your treatment, we will call you to review the results.   Testing/Procedures: Marland Kitchen- Long Term Monitor Instructions   Your physician has requested you wear your ZIO patch monitor__14_____days.   This is a single patch monitor.  Ir[employer] supplies one patch monitor per enrollment.  Additional stickers are not available.   Please do not apply patch if you will be having a Nuclear Stress Test, Echocardiogram, Cardiac CT, MRI, or Chest Xray during the time frame you would be wearing the monitor. The patch cannot be worn during these tests.  You cannot remove and re-apply the ZIO XT patch monitor.   Your ZIO patch monitor will be sent USPS Priority mail from [facility] directly to your home address. The monitor may also be mailed to a PO BOX if home delivery is not available.   It may take 3-5 days to receive your  monitor  after you have been enrolled.   Once you have received you monitor, please review enclosed instructions.  Your monitor has already been registered assigning a specific monitor serial # to you.   Applying the monitor   Shave hair from upper left chest.   Hold abrader disc by orange tab.  Rub abrader in 40 strokes over left upper chest as indicated in your monitor instructions.   Clean area with 4 enclosed alcohol pads .  Use all pads to assure are is cleaned thoroughly.  Let dry.   Apply patch as indicated in monitor instructions.  Patch will be place under collarbone on left side of chest with arrow pointing upward.   Rub patch adhesive wings for 2 minutes.Remove white label marked "1".  Remove white label marked "2".  Rub patch adhesive wings for 2 additional minutes.   While looking in a mirror, press and release button in center of patch.  A small green light will flash 3-4 times .  This will be your only indicator the monitor has been turned on.     Do not shower for the first 24 hours.  You may shower after the first 24 hours.   Press button if you feel a symptom. You will hear a small click.  Record Date, Time and Symptom in the Patient Log Book.   When you are ready to remove patch, follow instructions on last 2 pages of Patient Log Book.  Stick patch monitor onto last page of Patient Log Book.   Place Patient Log Book in [place] box.  Use locking tab on box and tape box closed securely.  The Orange and [employer] box has [employer] postage on it.  Please place in mailbox as soon as possible.  Your physician should have your test results approximately 7 days after the monitor has been mailed [facility] to [facility].   Call [facility] Customer Care at [phone] if you have questions regarding your ZIO XT patch monitor.  Call them immediately if you see an orange light blinking on your monitor.   If your monitor falls off in less than 4 days contact our Monitor department at  [phone].  If your monitor becomes loose or falls off after 4 days call [employer] at [phone] for suggestions on securing your monitor.     Follow-Up: [facility], you and your health needs are our priority.  As part of our continuing mission to provide you with exceptional heart care, we have created designated Provider Care Teams.  These Care Teams include your primary Cardiologist (physician) and Advanced Practice Providers (APPs -  Physician Assistants and Nurse Practitioners) who all work together to provide you with the care you need, when you need it.   Your next appointment:    You will see Dr Izora Ribashandrasekhar for a 3 month follow up appointment on September 02, 2020 at 1:20 PM.     Signed, Christell ConstantMahesh A Jisell Majer, MD  05/27/2020 2:26 PM    [facility]

## 2020-05-28 ENCOUNTER — Other Ambulatory Visit: Payer: Self-pay | Admitting: Family Medicine

## 2020-05-30 ENCOUNTER — Ambulatory Visit: Payer: BC Managed Care – PPO | Admitting: Clinical

## 2020-05-31 DIAGNOSIS — F4312 Post-traumatic stress disorder, chronic: Secondary | ICD-10-CM | POA: Diagnosis not present

## 2020-06-06 ENCOUNTER — Ambulatory Visit (INDEPENDENT_AMBULATORY_CARE_PROVIDER_SITE_OTHER): Payer: BC Managed Care – PPO | Admitting: Clinical

## 2020-06-06 DIAGNOSIS — F331 Major depressive disorder, recurrent, moderate: Secondary | ICD-10-CM

## 2020-06-07 DIAGNOSIS — F4312 Post-traumatic stress disorder, chronic: Secondary | ICD-10-CM | POA: Diagnosis not present

## 2020-06-09 NOTE — Progress Notes (Deleted)
Phone: [phone]    Subjective:  Patient presents today for their annual physical. Chief complaint-noted.   See problem oriented charting- ROS- full  review of systems was completed and negative  except for: ***  The following were reviewed and entered/updated in epic: Past Medical History:  Diagnosis Date  . Asthma   . Hypertension   . IBS (irritable bowel syndrome)    probiotic  . Posterior urethral valves   . Premature birth    3 months early  . Renal disorder   . Verbal apraxia    Patient Active Problem List   Diagnosis Date Noted  . Syncope 05/27/2020  . Palpitations 05/27/2020  . PTSD (post-traumatic stress disorder) 10/19/2019  . Daily headache 06/02/2019  . Asthma, severe persistent 05/25/2019  . Social anxiety disorder 05/04/2018  . Insomnia 05/04/2018  . Mesenteric lymphadenopathy 07/27/2016  . Posterior urethral valves (PUV) 06/04/2016  . GERD (gastroesophageal reflux disease) 06/04/2016  . Essential hypertension 06/04/2016  . Bladder spasms 06/04/2016  . GAD (generalized anxiety disorder) 06/04/2016  . CKD (chronic kidney disease), stage II 06/04/2016  . Major depression, recurrent (HCC) 06/04/2016  . Premature birth   . IBS (irritable bowel syndrome)   . Verbal apraxia    Past Surgical History:  Procedure Laterality Date  . ADENOIDECTOMY    . ESOPHAGEAL MANOMETRY N/A 01/23/2016   Procedure: ESOPHAGEAL MANOMETRY (EM);  Surgeon: Charna Elizabeth, MD;  Location: [facility];  Service: Endoscopy;  Laterality: N/A;  . LASIK     eye surgery july 2017  . NEPHRECTOMY     rght   . TONSILLECTOMY      Family History  Problem Relation Age of Onset  . Breast cancer Mother        survivor  . Hypertension Mother   . Hyperlipidemia Mother   . Other Father        pituitary tumor- gamma knife radiation  . Heart disease Maternal Grandfather   . Prostate cancer Maternal Grandfather        62s  . Melanoma Sister   . Prostate cancer Paternal Grandfather         45s  . Ovarian cancer Maternal Aunt        mets to liver     Medications- reviewed and updated Current Outpatient Medications  Medication Sig Dispense Refill  . ALPRAZolam (XANAX) 1 MG tablet Take 1 mg by mouth 2 (two) times daily as needed for anxiety.     Marland Kitchen BREO ELLIPTA 200-25 MCG/INH AEPB INHALE 1 PUFF INTO THE LUNGS EVERY DAY 60 each 5  . busPIRone (BUSPAR) 30 MG tablet TAKE 1 TABLET BY MOUTH TWICE A DAY 180 tablet 1  . Desvenlafaxine Succinate (PRISTIQ PO) Take 150 mg by mouth. Take 1 tablet in the morning    . EPINEPHrine 0.3 mg/0.3 mL IJ SOAJ injection Inject into the muscle as needed.    Dorise Hiss (AIMOVIG) 140 MG/ML SOAJ Inject 140 mg into the skin every 30 (thirty) days. 1 pen 11  . Fluticasone Furoate (ARNUITY ELLIPTA) 200 MCG/ACT AEPB Inhale 1 puff into the lungs daily. 1 each 5  . levalbuterol (XOPENEX HFA) 45 MCG/ACT inhaler Inhale 2 puffs into the lungs every 4 (four) hours as needed for wheezing. 1 Inhaler 12  . losartan (COZAAR) 100 MG tablet Take 1 tablet by mouth every evening.     . Mepolizumab (NUCALA) 100 MG/ML SOAJ Inject 100 mg into the skin every 30 (thirty) days.    Marland Kitchen MOTEGRITY 2 MG  TABS Take 1 tablet by mouth at bedtime.    . ondansetron (ZOFRAN-ODT) 8 MG disintegrating tablet TAKE 1 TABLET (8 MG TOTAL) BY MOUTH EVERY 8 (EIGHT) HOURS AS NEEDED FOR NAUSEA OR VOMITING. 20 tablet 3  . prazosin (MINIPRESS) 2 MG capsule Take 4 mg by mouth at bedtime.     . rizatriptan (MAXALT-MLT) 10 MG disintegrating tablet TAKE 1 TABLET EARLIEST ONSET OF MIGRAINE. MAY REPEAT IN 2 HRS IF NEEDED. MAX 2 TABLETS IN 24 HRS 6 tablet 5   Current Facility-Administered Medications  Medication Dose Route Frequency Provider Last Rate Last Admin  . Mepolizumab SOLR 100 mg  100 mg Subcutaneous Q28 days Marcelyn Bruins, MD   100 mg at 01/12/20 1725    Allergies-reviewed and updated Allergies  Allergen Reactions  . Albuterol     High HR  . Chocolate     Face  swelling/sounds like angioedema "I had to go to urgent care today due to an allergic reaction to a chocolate candy bar. My throat swelled up and was closing, my nasal passages were congested, and my face was hurting. They gave me a steroid shot at the clinic, and then they sent me home presidone to take for a week" 06/2019  . Lisinopril Other (See Comments)  . Other Other (See Comments)    Pain med? Unsure of medication, caused n/v    Social History   Social History Narrative   Single. Lives with mom and dad. His brother and wife and nephew also live with him. Brother and sister- healthy.       Full time at [employer]. Studying psychology with biology minor.    Thinking grad school for psychology- would plan for research over clinical   Right handed   Two story home   Does drink caffeine, one large coffee daily      Objective:  There were no vitals taken for this visit. Gen: NAD, resting comfortably HEENT: Mucous membranes are moist. Oropharynx normal Neck: no thyromegaly CV: RRR no murmurs rubs or gallops Lungs: CTAB no crackles, wheeze, rhonchi Abdomen: soft/nontender/nondistended/normal bowel sounds. No rebound or guarding.  Ext: no edema Skin: warm, dry Neuro: grossly normal, moves all extremities, PERRLA ***    Assessment and Plan:  24 y.o. male presenting for annual physical.  Health Maintenance counseling: 1. Anticipatory guidance: Patient counseled regarding regular dental exams ***q6 months, eye exams ***,  avoiding smoking and second hand smoke*** , limiting alcohol to 2 beverages per day***.   2. Risk factor reduction:  Advised patient of need for regular exercise and diet rich and fruits and vegetables to reduce risk of heart attack and stroke. Exercise- ***. Diet-***.  Wt Readings from Last 3 Encounters:  05/27/20 174 lb (78.9 kg)  05/06/20 165 lb (74.8 kg)  03/31/20 168 lb 12.8 oz (76.6 kg)   3. Immunizations/screenings/ancillary studies Immunization  History  Administered Date(s) Administered  . Influenza, Seasonal, Injecte, Preservative Fre 05/22/2018, 03/12/2019  . Influenza,inj,Quad PF,6+ Mos 05/22/2018, 03/12/2019, 02/27/2020  . Influenza-Unspecified 03/08/2016  . PFIZER SARS-COV-2 Vaccination 02/06/2020, 02/27/2020  . Tdap 05/22/2018   There are no preventive care reminders to display for this patient.  Family History  Problem Relation Age of Onset  . Breast cancer Mother        survivor  . Hypertension Mother   . Hyperlipidemia Mother   . Other Father        pituitary tumor- gamma knife radiation  . Heart disease Maternal Grandfather   .  Prostate cancer Maternal Grandfather        16s  . Melanoma Sister   . Prostate cancer Paternal Grandfather        28s  . Ovarian cancer Maternal Aunt        mets to liver    4. Prostate cancer screening- *** No results found for: PSA 5. Colon cancer screening - *** 6. Skin cancer screening/prevention- ***advised regular sunscreen use. Denies worrisome, changing, or new skin lesions.  7. Testicular cancer screening- advised monthly self exams *** 8. STD screening- patient opts *** 9. *** smoker-   Status of chronic or acute concerns  #hypertension S: medication: Losartan 100Mg ,Prazosin 2Mg  Home readings #s: *** BP Readings from Last 3 Encounters:  05/27/20 120/70  05/11/20 122/84  05/06/20 127/66  A/P: ***  # Asthma S: Maintenance Medication: BREO, Arnuity, Levalbuterol  As needed medication: ***. Patient is using this *** per week.  A/P: ***   For avs: *** Asthma control goals:   Full participation in all desired activities (may need albuterol before activity)  Albuterol use two time or less a week on average (not counting use with activity)  Cough interfering with sleep two time or less a month  Oral steroids no more than once a year  No hospitalizations # GERD S:Medication:  B12 levels related to PPI use: Lab Results  Component Value Date   VITAMINB12 382  05/22/2018   A/P: ***   #Chronic kidney disease stage III S: GFR is typically in the ***range -Patient knows to avoid NSAIDs***  A/P: ***   A/P: ***   # Anxiety S:Medication: Xanax 1Mg ,  Counseling: *** GAD 7 : Generalized Anxiety Score 12/24/2019  Nervous, Anxious, on Edge 1  Control/stop worrying 3  Worry too much - different things 3  Trouble relaxing 1  Restless 1  Easily annoyed or irritable 3  Afraid - awful might happen 3  Total GAD 7 Score 15  Anxiety Difficulty Somewhat difficult   A/P: ***   # Depression S: Medication: Buspar 30Mg  Depression screen [facility] 2/9 02/03/2020 11/17/2019 10/19/2019  Decreased Interest 0 0 2  Down, Depressed, Hopeless 0 0 1  PHQ - 2 Score 0 0 3  Altered sleeping 1 - 1  Tired, decreased energy 3 - 3  Change in appetite 3 - 0  Feeling bad or failure about yourself  0 - 0  Trouble concentrating 1 - 3  Moving slowly or fidgety/restless 0 - 0  Suicidal thoughts 0 - 0  PHQ-9 Score 8 - 10  Difficult doing work/chores Somewhat difficult - Not difficult at all  Some recent data might be hidden   A/P: ***  ***magnesium added by nephrology i believe *** No diagnosis found.  Recommended follow up: ***No follow-ups on file. Future Appointments  Date Time Provider Department Center  06/10/2020  1:20 PM 02/05/2020, MD [facility]  06/13/2020 12:00 PM Mendelson, 12/19/2019 [facility] None  06/20/2020 12:00 PM Mendelson, Shelva Majestic [facility] None  09/01/2020  2:30 PM Padgett, Hulda Humphrey, MD [facility] None  09/02/2020  1:20 PM Hulda Humphrey, MD [facility]  10/06/2020  2:30 PM Pilar Grammes, DO [facility] None    No chief complaint on file.  Lab/Order associations:*** fasting No diagnosis found.  No orders of the defined types were placed in this encounter.   Return precautions advised.   09/04/2020, CMA

## 2020-06-10 ENCOUNTER — Encounter: Payer: BC Managed Care – PPO | Admitting: Family Medicine

## 2020-06-10 DIAGNOSIS — N182 Chronic kidney disease, stage 2 (mild): Secondary | ICD-10-CM

## 2020-06-10 DIAGNOSIS — F3342 Major depressive disorder, recurrent, in full remission: Secondary | ICD-10-CM

## 2020-06-10 DIAGNOSIS — F411 Generalized anxiety disorder: Secondary | ICD-10-CM

## 2020-06-10 DIAGNOSIS — J4550 Severe persistent asthma, uncomplicated: Secondary | ICD-10-CM

## 2020-06-10 DIAGNOSIS — K219 Gastro-esophageal reflux disease without esophagitis: Secondary | ICD-10-CM

## 2020-06-10 DIAGNOSIS — I10 Essential (primary) hypertension: Secondary | ICD-10-CM

## 2020-06-10 DIAGNOSIS — Z0000 Encounter for general adult medical examination without abnormal findings: Secondary | ICD-10-CM

## 2020-06-13 ENCOUNTER — Ambulatory Visit (INDEPENDENT_AMBULATORY_CARE_PROVIDER_SITE_OTHER): Payer: BC Managed Care – PPO | Admitting: Clinical

## 2020-06-13 DIAGNOSIS — F331 Major depressive disorder, recurrent, moderate: Secondary | ICD-10-CM

## 2020-06-14 DIAGNOSIS — F4312 Post-traumatic stress disorder, chronic: Secondary | ICD-10-CM | POA: Diagnosis not present

## 2020-06-20 ENCOUNTER — Ambulatory Visit (INDEPENDENT_AMBULATORY_CARE_PROVIDER_SITE_OTHER): Payer: BC Managed Care – PPO | Admitting: Clinical

## 2020-06-20 DIAGNOSIS — F331 Major depressive disorder, recurrent, moderate: Secondary | ICD-10-CM

## 2020-06-21 DIAGNOSIS — F4312 Post-traumatic stress disorder, chronic: Secondary | ICD-10-CM | POA: Diagnosis not present

## 2020-06-23 DIAGNOSIS — Z905 Acquired absence of kidney: Secondary | ICD-10-CM | POA: Diagnosis not present

## 2020-06-23 DIAGNOSIS — N1339 Other hydronephrosis: Secondary | ICD-10-CM | POA: Diagnosis not present

## 2020-06-23 DIAGNOSIS — N3289 Other specified disorders of bladder: Secondary | ICD-10-CM | POA: Diagnosis not present

## 2020-06-27 DIAGNOSIS — Q642 Congenital posterior urethral valves: Secondary | ICD-10-CM | POA: Diagnosis not present

## 2020-06-27 DIAGNOSIS — R82991 Hypocitraturia: Secondary | ICD-10-CM | POA: Diagnosis not present

## 2020-06-27 DIAGNOSIS — N1339 Other hydronephrosis: Secondary | ICD-10-CM | POA: Diagnosis not present

## 2020-06-27 DIAGNOSIS — N182 Chronic kidney disease, stage 2 (mild): Secondary | ICD-10-CM | POA: Diagnosis not present

## 2020-06-28 DIAGNOSIS — F4312 Post-traumatic stress disorder, chronic: Secondary | ICD-10-CM | POA: Diagnosis not present

## 2020-06-30 DIAGNOSIS — F4312 Post-traumatic stress disorder, chronic: Secondary | ICD-10-CM | POA: Diagnosis not present

## 2020-07-03 ENCOUNTER — Other Ambulatory Visit: Payer: Self-pay | Admitting: Allergy

## 2020-07-05 DIAGNOSIS — F4312 Post-traumatic stress disorder, chronic: Secondary | ICD-10-CM | POA: Diagnosis not present

## 2020-07-06 IMAGING — MR MR HEAD WO/W CM
14 series · 48 of 48 positions shown · IV contrast (multihance)
Comparison: Head CT 02/19/2007.

CLINICAL DATA: 23-year-old male with chronic headaches. Pain on
both sides of the head. Verbal apraxia. Hypertension.

Creatinine was obtained on site at [HOSPITAL] at [HOSPITAL].
Results: Creatinine 1.3 mg/dL.
EXAM:
MRI HEAD WITHOUT AND WITH CONTRAST
TECHNIQUE: Multiplanar, multiecho pulse sequences of the brain and surrounding
structures were obtained without and with intravenous contrast.
CONTRAST:  16mL MULTIHANCE GADOBENATE DIMEGLUMINE 529 MG/ML IV SOLN

[Series 5: T1 · sagittal · 4.0mm · 0.75mm/px · 1 of 29 slices shown (1 of 3)]
[im 1/29]
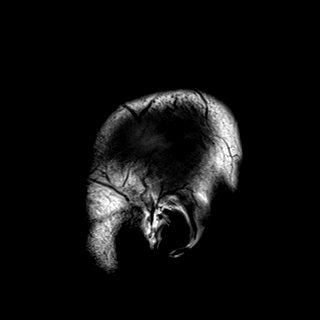

[Series 6: DWI · axial · 3.0mm · 1.50mm/px · z∈[-87,+68]mm · 4 of 96 slices shown (1 of 4)]
[im 1/96]
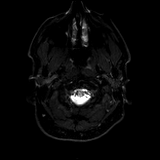
[im 32/96]
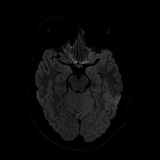
[im 64/96]
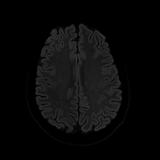
[im 96/96]
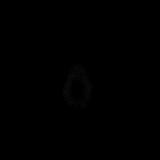

[Series 7: DWI · axial · 3.0mm · 1.50mm/px · z∈[-87,+68]mm · 3 of 46 slices shown (2 of 4)]
[im 1/46]
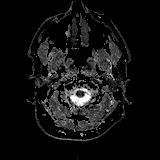
[im 23/46]
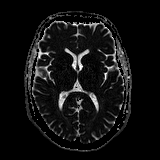
[im 46/46]
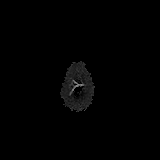

[Series 8: DWI · coronal · 5.0mm · 1.44mm/px · 4 of 70 slices shown (3 of 4)]
[im 1/70]
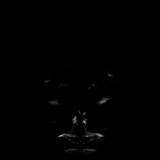
[im 24/70]
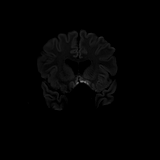
[im 47/70]
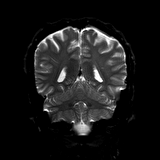
[im 70/70]
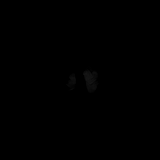

[Series 9: DWI · coronal · 5.0mm · 1.44mm/px · 2 of 35 slices shown (4 of 4)]
[im 1/35]
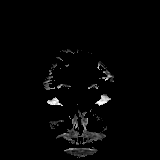
[im 35/35]
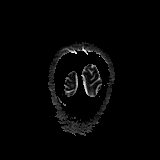

[Series 10: T2 · axial · 4.0mm · 0.38mm/px · z∈[-95,+72]mm · 2 of 33 slices shown]
[im 1/33]
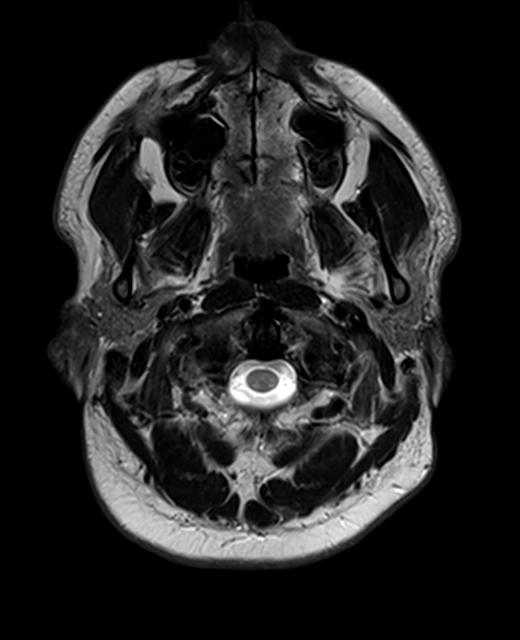
[im 33/33]
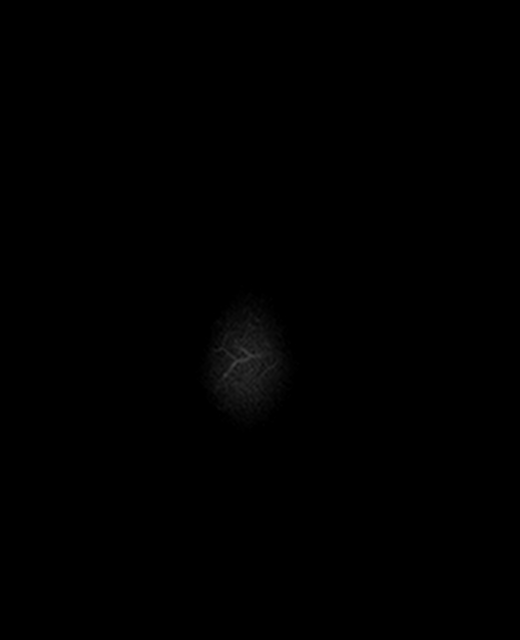

[Series 11: FLAIR · axial · 3.0mm · 0.72mm/px · 1 of 26 slices shown (1 of 2)]
[im 1/26]
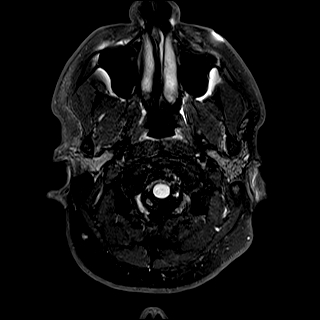

[Series 13: swi_images · axial · 1.5mm · 0.90mm/px · z∈[-82,+60]mm · 5 of 96 slices shown]
[im 1/96]
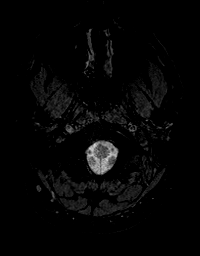
[im 24/96]
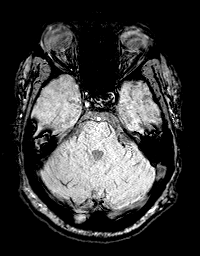
[im 48/96]
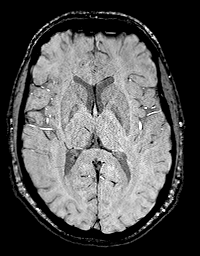
[im 72/96]
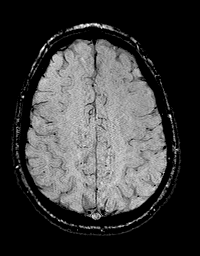
[im 96/96]
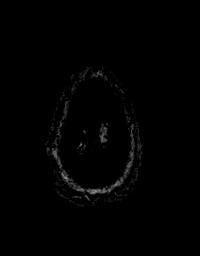

[Series 14: T1 · axial · 1.0mm · 0.94mm/px · z∈[-94,+65]mm · 9 of 160 slices shown (2 of 3)]
[im 1/160]
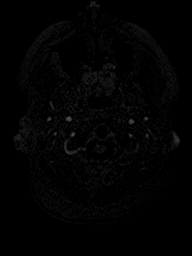
[im 20/160]
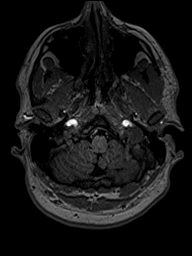
[im 40/160]
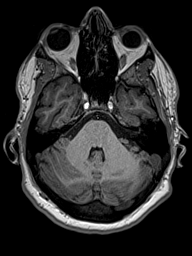
[im 60/160]
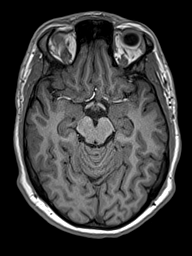
[im 80/160]
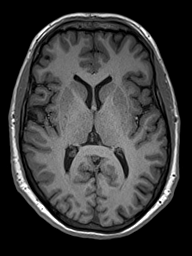
[im 100/160]
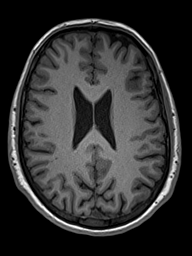
[im 120/160]
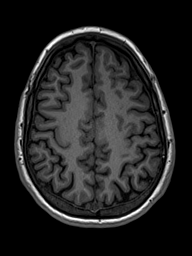
[im 140/160]
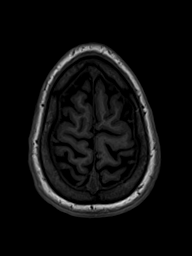
[im 160/160]
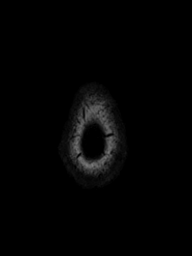

[Series 15: FLAIR · sagittal · 4.0mm · 0.72mm/px · 2 of 28 slices shown (2 of 2)]
[im 1/28]
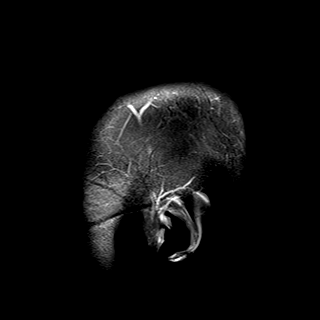
[im 28/28]
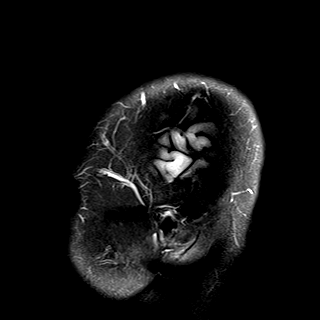

[Series 16: T2 post-contrast · coronal · 4.5mm · 0.36mm/px · 2 of 33 slices shown]
[im 1/33]
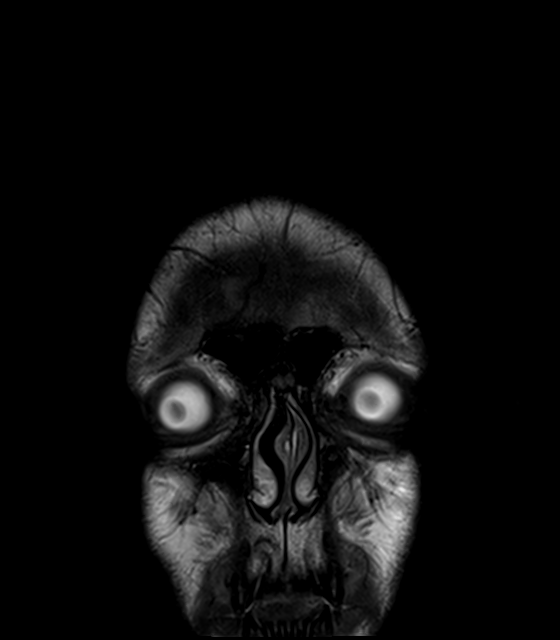
[im 33/33]
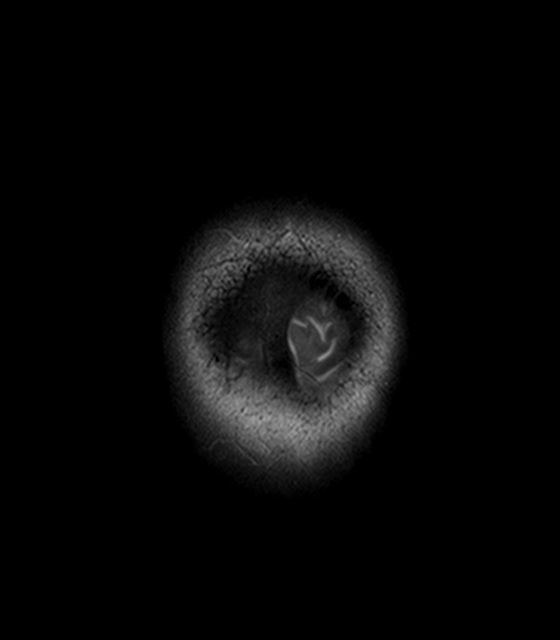

[Series 17: T1 · axial · 1.0mm · 0.94mm/px · z∈[-94,+65]mm · 9 of 160 slices shown (3 of 3)]
[im 1/160]
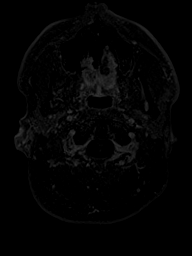
[im 20/160]
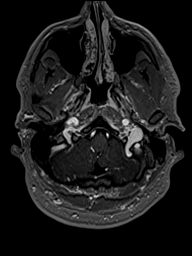
[im 40/160]
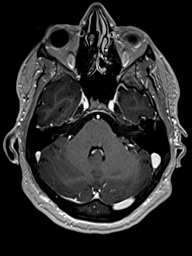
[im 60/160]
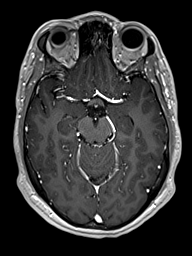
[im 80/160]
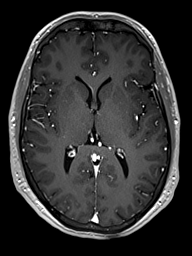
[im 100/160]
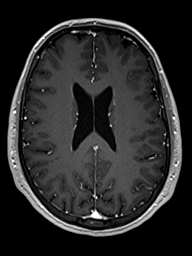
[im 120/160]
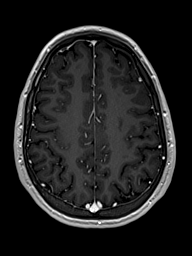
[im 140/160]
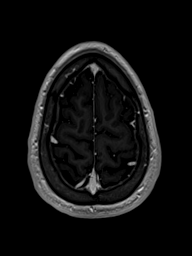
[im 160/160]
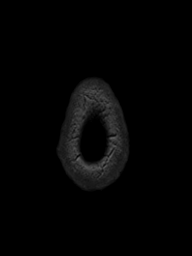

[Series 18: T1 post-contrast · coronal · 4.5mm · 0.72mm/px · 2 of 33 slices shown (1 of 2)]
[im 1/33]
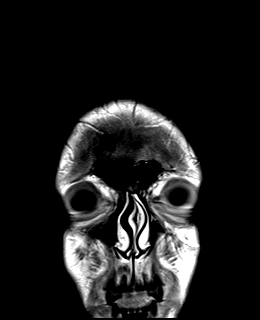
[im 33/33]
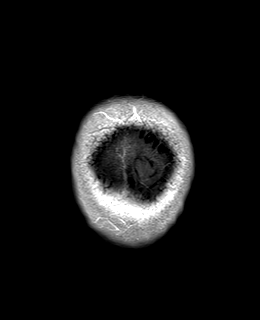

[Series 19: T1 post-contrast · sagittal · 4.0mm · 0.75mm/px · 2 of 31 slices shown (2 of 2)]
[im 1/31]
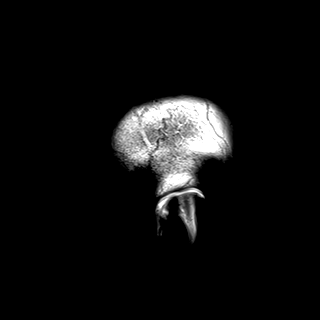
[im 31/31]
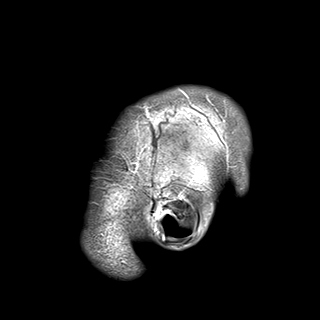

[48 of 48 positions shown; findings below may reference images not displayed]

FINDINGS: Brain: No restricted diffusion to suggest acute infarction. No
midline shift, mass effect, evidence of mass lesion,
ventriculomegaly, extra-axial collection or acute intracranial
hemorrhage. Cervicomedullary junction and pituitary are within
normal limits.

Cerebral volume is not significantly changed since 5332 and appears
within normal limits. Gray and white matter signal is within normal
limits throughout the brain. No encephalomalacia identified. No
chronic cerebral blood products identified.

No abnormal enhancement identified. No dural thickening.

Vascular: Major intracranial vascular flow voids are preserved. The
major dural venous sinuses are enhancing and appear to be patent.

Skull and upper cervical spine: Normal visible cervical spine.
Visualized bone marrow signal is within normal limits.

Sinuses/Orbits: Normal orbits. Paranasal sinuses are well
pneumatized, there is occasional trace mucosal thickening. There is
a tiny mucous retention cyst in the right maxillary sinus on series
10, image 3. And there is a small volume of trapped fluid or mucous
retention cyst in mildly hyperplastic left sphenoid sinus on series
10 images 7 and 8.

Other: Mastoid air cells are clear. Visible internal auditory
structures appear normal. Normal stylomastoid foramina. Scalp and
face Soft tissues appear negative.
IMPRESSION: Normal MRI appearance of the brain.

## 2020-07-07 DIAGNOSIS — F4312 Post-traumatic stress disorder, chronic: Secondary | ICD-10-CM | POA: Diagnosis not present

## 2020-07-12 DIAGNOSIS — F4312 Post-traumatic stress disorder, chronic: Secondary | ICD-10-CM | POA: Diagnosis not present

## 2020-07-15 DIAGNOSIS — F4312 Post-traumatic stress disorder, chronic: Secondary | ICD-10-CM | POA: Diagnosis not present

## 2020-07-16 DIAGNOSIS — F330 Major depressive disorder, recurrent, mild: Secondary | ICD-10-CM | POA: Diagnosis not present

## 2020-07-16 DIAGNOSIS — F5082 Avoidant/restrictive food intake disorder: Secondary | ICD-10-CM | POA: Diagnosis not present

## 2020-07-16 DIAGNOSIS — F4312 Post-traumatic stress disorder, chronic: Secondary | ICD-10-CM | POA: Diagnosis not present

## 2020-07-16 DIAGNOSIS — F411 Generalized anxiety disorder: Secondary | ICD-10-CM | POA: Diagnosis not present

## 2020-07-18 ENCOUNTER — Ambulatory Visit (INDEPENDENT_AMBULATORY_CARE_PROVIDER_SITE_OTHER): Payer: BC Managed Care – PPO | Admitting: Clinical

## 2020-07-18 DIAGNOSIS — F331 Major depressive disorder, recurrent, moderate: Secondary | ICD-10-CM

## 2020-07-19 DIAGNOSIS — F4312 Post-traumatic stress disorder, chronic: Secondary | ICD-10-CM | POA: Diagnosis not present

## 2020-07-21 DIAGNOSIS — R808 Other proteinuria: Secondary | ICD-10-CM | POA: Diagnosis not present

## 2020-07-21 DIAGNOSIS — I129 Hypertensive chronic kidney disease with stage 1 through stage 4 chronic kidney disease, or unspecified chronic kidney disease: Secondary | ICD-10-CM | POA: Diagnosis not present

## 2020-07-21 DIAGNOSIS — R000 Tachycardia, unspecified: Secondary | ICD-10-CM | POA: Diagnosis not present

## 2020-07-21 DIAGNOSIS — E790 Hyperuricemia without signs of inflammatory arthritis and tophaceous disease: Secondary | ICD-10-CM | POA: Diagnosis not present

## 2020-07-21 DIAGNOSIS — N182 Chronic kidney disease, stage 2 (mild): Secondary | ICD-10-CM | POA: Diagnosis not present

## 2020-07-21 DIAGNOSIS — Z905 Acquired absence of kidney: Secondary | ICD-10-CM | POA: Diagnosis not present

## 2020-07-21 DIAGNOSIS — Z87440 Personal history of urinary (tract) infections: Secondary | ICD-10-CM | POA: Diagnosis not present

## 2020-07-21 DIAGNOSIS — Z79899 Other long term (current) drug therapy: Secondary | ICD-10-CM | POA: Diagnosis not present

## 2020-07-21 DIAGNOSIS — K219 Gastro-esophageal reflux disease without esophagitis: Secondary | ICD-10-CM | POA: Diagnosis not present

## 2020-07-21 DIAGNOSIS — Z8616 Personal history of COVID-19: Secondary | ICD-10-CM | POA: Diagnosis not present

## 2020-07-21 DIAGNOSIS — R0789 Other chest pain: Secondary | ICD-10-CM | POA: Diagnosis not present

## 2020-07-21 DIAGNOSIS — R3989 Other symptoms and signs involving the genitourinary system: Secondary | ICD-10-CM | POA: Diagnosis not present

## 2020-07-21 DIAGNOSIS — Z0184 Encounter for antibody response examination: Secondary | ICD-10-CM | POA: Diagnosis not present

## 2020-07-21 DIAGNOSIS — R635 Abnormal weight gain: Secondary | ICD-10-CM | POA: Diagnosis not present

## 2020-07-21 DIAGNOSIS — R109 Unspecified abdominal pain: Secondary | ICD-10-CM | POA: Diagnosis not present

## 2020-07-21 DIAGNOSIS — R112 Nausea with vomiting, unspecified: Secondary | ICD-10-CM | POA: Diagnosis not present

## 2020-07-25 ENCOUNTER — Ambulatory Visit: Payer: BC Managed Care – PPO | Admitting: Clinical

## 2020-07-26 DIAGNOSIS — F4312 Post-traumatic stress disorder, chronic: Secondary | ICD-10-CM | POA: Diagnosis not present

## 2020-07-27 ENCOUNTER — Ambulatory Visit (INDEPENDENT_AMBULATORY_CARE_PROVIDER_SITE_OTHER): Payer: BC Managed Care – PPO | Admitting: Clinical

## 2020-07-27 DIAGNOSIS — F331 Major depressive disorder, recurrent, moderate: Secondary | ICD-10-CM | POA: Diagnosis not present

## 2020-08-01 ENCOUNTER — Telehealth: Payer: Self-pay

## 2020-08-01 ENCOUNTER — Ambulatory Visit: Payer: BC Managed Care – PPO | Admitting: Clinical

## 2020-08-01 DIAGNOSIS — F4312 Post-traumatic stress disorder, chronic: Secondary | ICD-10-CM | POA: Diagnosis not present

## 2020-08-01 MED ORDER — PREDNISONE 10 MG PO TABS: ORAL_TABLET | ORAL | 0 refills | Status: DC

## 2020-08-01 NOTE — Telephone Encounter (Signed)
Patient called stating he is having SOB since this past Thursday and is very difficult. Not getting much release from his rescue inhaler and takes two puffs every day. Currently having a flare-up and is experiencing and Asthma flare up. Patient was given prednisone last office visit to have on hand when he has a flare up and did not want to use it unless we directed him too.  Patient is taking Breo and Arnuity every Am and not getting relief so wanted to see if he needs to be seen or have something called in. [facility] uses [facility] on [address], patient will not be available be 12-1 PM today but will wait on our call.

## 2020-08-01 NOTE — Telephone Encounter (Signed)
Spoke with the patient and he reports shortness of breath  and some wheezing since Thursday. He denies any tightness in his chest, cough, or fever/chills. Instructed him to continue Breo 1 puff once a day, Arnuity 200 mcg 1 puff once a day, Nucala injections every 4 weeks, and albuterol 2 puffs every 4 hours as needed. Able to speak in full sentences. Using his albuterol 2 puffs every day. Instructed him that we would send in prednisone because the prednisone he has on hand does not have instructions. Please send in prednisone 10 mg 2 tablets twice a day for 3 days, then on the fourth day take 2 tablets in the morning, on the fifth day take 1 tablet and stop. Quantity 15 with no refills.  Instructed James Matthews that if his symptoms worsen or persist to go on to the emergency room or urgent care.  He verbalizes understanding.  Also instructed him to keep his follow-up appointment with Dr. Delorse Lek on September 01, 2020 at 2:30 PM and to call us if needed beforehand.

## 2020-08-01 NOTE — Telephone Encounter (Signed)
Per Nehemiah Settle medication was [facility] to [facility] [address].

## 2020-08-02 ENCOUNTER — Other Ambulatory Visit: Payer: Self-pay | Admitting: Family Medicine

## 2020-08-02 ENCOUNTER — Other Ambulatory Visit: Payer: Self-pay | Admitting: Neurology

## 2020-08-02 NOTE — Telephone Encounter (Signed)
Submitted an appeal for this 08/01/20. Awaiting determination. Denied through [facility] for the PA so faxed in the clinicals and appeal info to ins. I will let you know what comes back.

## 2020-08-02 NOTE — Telephone Encounter (Signed)
PA needed on Rizatriptan

## 2020-08-02 NOTE — Telephone Encounter (Signed)
Likely needs prior authorization. He gets a very elevated heart rate on albuterol

## 2020-08-02 NOTE — Telephone Encounter (Signed)
See below

## 2020-08-04 ENCOUNTER — Ambulatory Visit (INDEPENDENT_AMBULATORY_CARE_PROVIDER_SITE_OTHER): Payer: BC Managed Care – PPO | Admitting: Clinical

## 2020-08-04 DIAGNOSIS — H52223 Regular astigmatism, bilateral: Secondary | ICD-10-CM | POA: Diagnosis not present

## 2020-08-04 DIAGNOSIS — F331 Major depressive disorder, recurrent, moderate: Secondary | ICD-10-CM

## 2020-08-04 DIAGNOSIS — H5213 Myopia, bilateral: Secondary | ICD-10-CM | POA: Diagnosis not present

## 2020-08-08 ENCOUNTER — Telehealth: Payer: Self-pay

## 2020-08-08 ENCOUNTER — Encounter: Payer: Self-pay | Admitting: Neurology

## 2020-08-08 ENCOUNTER — Ambulatory Visit: Payer: BC Managed Care – PPO | Admitting: Clinical

## 2020-08-08 NOTE — Telephone Encounter (Signed)
I got a denial on the appeal and submitted a second level appeal just last week so waiting to hear back from that. I will let you know what we get from them for the second level appeal.

## 2020-08-08 NOTE — Telephone Encounter (Signed)
Pt picked up Urbelvy samples.   Pt state his insurance will not cover Aimovig/  Chelsea when you get a chance to can you check the status of this. Pt states if we can not get it approved or it is a non cover benefit DR.Everlena Cooper he is okay with switching to Ajovy.

## 2020-08-08 NOTE — Telephone Encounter (Signed)
Yes, we can switch him to [employer]

## 2020-08-08 NOTE — Progress Notes (Signed)
[place] (Key: [id number]) Aimovig 140MG /ML auto-injectors   Form Blue Form (CB) Created 5 minutes ago Sent to Plan 3 minutes ago Plan Response 3 minutes ago Submit Clinical Questions 1 minute ago Determination Favorable 1 minute ago Message from Plan Effective from 08/08/2020 through 10/30/2020.

## 2020-08-08 NOTE — Telephone Encounter (Signed)
We will appeal the denial for rizatriptan.  In the meantime, he may come by the office to pick up samples of Ubrelvy 100mg  - take one tablet as needed, may repeat in 2 hours (maximum 2 tablets in 24hours)

## 2020-08-10 ENCOUNTER — Ambulatory Visit (INDEPENDENT_AMBULATORY_CARE_PROVIDER_SITE_OTHER): Payer: BC Managed Care – PPO | Admitting: Clinical

## 2020-08-10 DIAGNOSIS — F331 Major depressive disorder, recurrent, moderate: Secondary | ICD-10-CM | POA: Diagnosis not present

## 2020-08-10 MED ORDER — PREDNISONE 20 MG PO TABS: 20.0000 mg | ORAL_TABLET | Freq: Every day | ORAL | 0 refills | Status: AC

## 2020-08-11 ENCOUNTER — Ambulatory Visit: Payer: BC Managed Care – PPO | Admitting: Clinical

## 2020-08-11 DIAGNOSIS — F4312 Post-traumatic stress disorder, chronic: Secondary | ICD-10-CM | POA: Diagnosis not present

## 2020-08-12 ENCOUNTER — Telehealth: Payer: Self-pay | Admitting: Neurology

## 2020-08-12 MED ORDER — RIZATRIPTAN BENZOATE 10 MG PO TABS: ORAL_TABLET | ORAL | 5 refills | Status: DC

## 2020-08-12 NOTE — Telephone Encounter (Signed)
[employer] will not approve MLT rizatriptan.  Will send in for regular tablet

## 2020-08-15 ENCOUNTER — Ambulatory Visit: Payer: BC Managed Care – PPO | Admitting: Clinical

## 2020-08-16 ENCOUNTER — Other Ambulatory Visit: Payer: Self-pay | Admitting: Emergency Medicine

## 2020-08-16 DIAGNOSIS — F4312 Post-traumatic stress disorder, chronic: Secondary | ICD-10-CM | POA: Diagnosis not present

## 2020-08-18 ENCOUNTER — Ambulatory Visit (INDEPENDENT_AMBULATORY_CARE_PROVIDER_SITE_OTHER): Payer: BC Managed Care – PPO | Admitting: Clinical

## 2020-08-18 DIAGNOSIS — F331 Major depressive disorder, recurrent, moderate: Secondary | ICD-10-CM

## 2020-08-19 NOTE — Telephone Encounter (Signed)
Prior Authorization started on 08/17/20 for patient medication Xopenex thru Cover My Meds. Waiting on response from insurance co.

## 2020-08-22 ENCOUNTER — Ambulatory Visit: Payer: BC Managed Care – PPO | Admitting: Clinical

## 2020-08-23 DIAGNOSIS — F4312 Post-traumatic stress disorder, chronic: Secondary | ICD-10-CM | POA: Diagnosis not present

## 2020-08-24 NOTE — Progress Notes (Signed)
Phone [phone] In person visit   Subjective:   James Matthews is a 25 y.o. year old very pleasant male patient who presents for/with See problem oriented charting Chief Complaint  Patient presents with  . Blurred Vision  . Dizziness  . Generalized Body Aches   This visit occurred during the SARS-CoV-2 public health emergency.  Safety protocols were in place, including screening questions prior to the visit, additional usage of staff PPE, and extensive cleaning of exam room while observing appropriate contact time as indicated for disinfecting solutions.   Past Medical History-  Patient Active Problem List   Diagnosis Date Noted  . Daily headache 06/02/2019    Priority: High  . Asthma, severe persistent 05/25/2019    Priority: High  . Major depression, recurrent (HCC) 06/04/2016    Priority: High  . Verbal apraxia     Priority: High  . PTSD (post-traumatic stress disorder) 10/19/2019    Priority: Medium  . Posterior urethral valves (PUV) 06/04/2016    Priority: Medium  . Essential hypertension 06/04/2016    Priority: Medium  . Bladder spasms 06/04/2016    Priority: Medium  . GAD (generalized anxiety disorder) 06/04/2016    Priority: Medium  . CKD (chronic kidney disease), stage II 06/04/2016    Priority: Medium  . Mesenteric lymphadenopathy 07/27/2016    Priority: Low  . GERD (gastroesophageal reflux disease) 06/04/2016    Priority: Low  . Premature birth     Priority: Low  . IBS (irritable bowel syndrome)     Priority: Low  . Syncope 05/27/2020  . Palpitations 05/27/2020  . Social anxiety disorder 05/04/2018  . Insomnia 05/04/2018    Medications- reviewed and updated Current Outpatient Medications  Medication Sig Dispense Refill  . ALPRAZolam (XANAX) 1 MG tablet Take 1 mg by mouth 2 (two) times daily as needed for anxiety.     Marland Kitchen BREO ELLIPTA 200-25 MCG/INH AEPB INHALE 1 PUFF INTO THE LUNGS EVERY DAY 60 each 5  . busPIRone (BUSPAR) 30 MG tablet  TAKE 1 TABLET BY MOUTH TWICE A DAY 180 tablet 1  . Desvenlafaxine Succinate (PRISTIQ PO) Take 150 mg by mouth. Take 1 tablet in the morning    . EPINEPHRINE 0.3 mg/0.3 mL IJ SOAJ injection INJECT 0.3 MLS INTO THE MUSCLE ONCE FOR 1 DOSE. AS DIRECTED FOR LIFE-THREATENING ALLERGIC REACTIONS 2 each 1  . Erenumab-aooe (AIMOVIG) 140 MG/ML SOAJ Inject 140 mg into the skin every 30 (thirty) days. 1 pen 11  . eszopiclone (LUNESTA) 2 MG TABS tablet Take 2 mg by mouth at bedtime.    . Fluticasone Furoate (ARNUITY ELLIPTA) 200 MCG/ACT AEPB Inhale 1 puff into the lungs daily. 1 each 5  . levalbuterol (XOPENEX HFA) 45 MCG/ACT inhaler TAKE 2 PUFFS BY MOUTH EVERY 4 HOURS AS NEEDED FOR WHEEZE 15 each 12  . losartan (COZAAR) 100 MG tablet Take 1 tablet by mouth every evening.     . ondansetron (ZOFRAN-ODT) 8 MG disintegrating tablet TAKE 1 TABLET (8 MG TOTAL) BY MOUTH EVERY 8 (EIGHT) HOURS AS NEEDED FOR NAUSEA OR VOMITING. 20 tablet 3  . prazosin (MINIPRESS) 2 MG capsule Take 4 mg by mouth at bedtime.     . rizatriptan (MAXALT) 10 MG tablet Take 1 tablet earliest onset of migraine.  May repeat in 2 hours if needed.  Maximum 2 tablets in 24 hours 10 tablet 5  . Mepolizumab (NUCALA) 100 MG/ML SOAJ Inject 100 mg into the skin every 30 (thirty) days. (Patient not taking: Reported  on 08/25/2020)    . MOTEGRITY 2 MG TABS Take 1 tablet by mouth at bedtime. (Patient not taking: Reported on 08/25/2020)     Current Facility-Administered Medications  Medication Dose Route Frequency Provider Last Rate Last Admin  . Mepolizumab SOLR 100 mg  100 mg Subcutaneous Q28 days Marcelyn Bruins, MD   100 mg at 01/12/20 1725     Objective:  BP 128/82   Pulse 100   Temp (!) 95.5 F (35.3 C) (Temporal)   Ht 6\' 1"  (1.854 m)   SpO2 97%   BMI 22.96 kg/m  Gen: NAD, resting comfortably CV: RRR no murmurs rubs or gallops Lungs: CTAB no crackles, wheeze, rhonchi Abdomen: soft/nontender/nondistended/normal bowel sounds. No  rebound or guarding.  Ext: no edema Skin: warm, dry  Neuro: CN II-XII intact, sensation and reflexes normal throughout, 5/5 muscle strength in bilateral upper and lower extremities. Normal finger to nose. Normal rapid alternating movements. No pronator drift. Normal romberg. Normal gait.  Stuttering per baseline    Assessment and Plan   #hypertension/hypotension/blurry vision/dizziness/body aches S: medication: Losartan 100Mg  increased to this last summer after hospitalization when blood pressure was running up closer to 140 systolic and over 100 diastolic-nephrology increase to 100 mg at that time. Dr. not yet aware. Outside of these episodes getting 130/85. Drinks 2 L a day  Twice a week for last 3 weeks episodes of low blood pressure- feels dizzy, notes blurry vision, has some achiness through upper back and neck. Blood pressure during these episodes can be really low-for instance 99/49 and with another episode was 64/49 (this was worst episode and lipids felt numb- salt water helped and got up to 110/60s). Feels poorly even into the next day when this occurs. Most recent episode on Tuesday of this week- coke and candy bars helped. Mom states seems to lose color in his face. Feeling more tired after activity lately- may seem tired after taking a shower. Standing or walking long times even outside of low Bp makes him feel weak.   No chest pain. Has felt short of breath related to asthma. Heart racing with episodes. HR up to 120s when feeling poorly  Has been having some headaches but realized he was spending too much time on his laptop-20 cut back on this was doing much better  BP Readings from Last 3 Encounters:  08/25/20 128/82  05/27/20 120/70  05/11/20 122/84  A/P: 25 year old male with about 2 episodes per week of feeling poorly with blurry vision, body aches, dizziness with blood pressures less than 100 systolic.  We are going to reduce losartan to 50 mg.  Seems to be driven by  blood pressure-neurological exam reassuring today.  I also do not want to let his blood pressure run too high due to only having unilateral kidney.  He is already doing a good job with hydration.  Encouraged him to continue this.  Check labs as below.  Has also had some pallor-make sure no anemia.  History of sepsis but does not appear acutely ill-we will still check UA.  I like the idea of him keeping around something with electrolytes as that has been helping push the blood pressure back up-we may need to further decrease blood pressure medicine-recommended to make follow-up [facility] next week  Patient with syncopal episode last year-was to wear cardiac monitor but apparently this got lost-we discussed reordering this but he sees cardiology next week and would prefer to discuss with them.  We also considered  EKG but he would like to do that with cardiology.  Asymptomatic right now but concern will be possible arrhythmia.  Recommended follow up: 3-week follow-up recommended Future Appointments  Date Time Provider Department Center  08/25/2020  3:00 PM Mendelson, Hulda Humphrey [facility] None  09/01/2020  2:30 PM Padgett, Pilar Grammes, MD [facility] None  09/01/2020  3:00 PM Mendelson, Hulda Humphrey [facility] None  09/02/2020  1:20 PM Christell Constant, MD [facility]  09/05/2020 12:00 PM Mendelson, Eileen Stanford L [facility] None  09/12/2020 12:00 PM Mendelson, Eileen Stanford L [facility] None  09/16/2020  2:20 PM Shelva Majestic, MD [facility]  09/19/2020 12:00 PM Mendelson, Eileen Stanford L [facility] None  09/26/2020 12:00 PM Mendelson, Eileen Stanford L [facility] None  10/03/2020 12:00 PM Mendelson, Eileen Stanford L [facility] None  10/06/2020  2:30 PM Drema Dallas, DO [facility] None  10/10/2020 12:00 PM Mendelson, Hulda Humphrey [facility] None  10/13/2020  2:40 PM Marycarmen Hagey, Aldine Contes, MD [facility]    Lab/Order associations:   ICD-10-CM   1. Essential hypertension  I10 CBC with Differential/Platelet    Comprehensive metabolic panel     TSH    POCT Urinalysis Dipstick (Automated)    POCT Urinalysis Dipstick (Automated)  2. Hypotension due to drugs  I95.2   3. Dizziness  R42      Time Spent: 34 minutes of total time (11:16 AM- 11:42 AM, 12: 40 PM-12:48 PM) was spent on the date of the encounter performing the following actions: chart review prior to seeing the patient, obtaining history, performing a medically necessary exam, counseling on the treatment plan, placing orders, and documenting in our EHR.   Return precautions advised.  Tana Conch, MD

## 2020-08-24 NOTE — Patient Instructions (Addendum)
Please stop by lab before you go- blood and urine If you have mychart- we will send your results within 3 business days of [place] receiving them.  If you do not have mychart- we will call you about results within 5 business days of [place] receiving them.  *please also note that you will see labs on mychart as soon as they post. I will later go in and write notes on them- will say "notes from Dr. Durene Cal"  Reduce losartan to 50mg  (med list will still say 100 for now until we know that lower dose is effective). Monitor blood pressure daily. Also keep a journal if recurrent episodes (but I am hopeful you do not have them with lower dose).   Keep cardiology visit  Recommended follow up: lets do a 3 week check in to see how you are doing- sooner if more frequent or worsening episodes

## 2020-08-25 ENCOUNTER — Ambulatory Visit (INDEPENDENT_AMBULATORY_CARE_PROVIDER_SITE_OTHER): Payer: BC Managed Care – PPO | Admitting: Family Medicine

## 2020-08-25 ENCOUNTER — Ambulatory Visit (INDEPENDENT_AMBULATORY_CARE_PROVIDER_SITE_OTHER): Payer: BC Managed Care – PPO | Admitting: Clinical

## 2020-08-25 ENCOUNTER — Encounter: Payer: Self-pay | Admitting: Family Medicine

## 2020-08-25 ENCOUNTER — Other Ambulatory Visit: Payer: Self-pay

## 2020-08-25 VITALS — BP 128/82 | HR 100 | Temp 95.5°F | Ht 73.0 in

## 2020-08-25 DIAGNOSIS — R42 Dizziness and giddiness: Secondary | ICD-10-CM

## 2020-08-25 DIAGNOSIS — I952 Hypotension due to drugs: Secondary | ICD-10-CM

## 2020-08-25 DIAGNOSIS — I10 Essential (primary) hypertension: Secondary | ICD-10-CM

## 2020-08-25 DIAGNOSIS — F331 Major depressive disorder, recurrent, moderate: Secondary | ICD-10-CM

## 2020-08-25 LAB — POC URINALSYSI DIPSTICK (AUTOMATED)
Bilirubin, UA: NEGATIVE
Blood, UA: NEGATIVE
Glucose, UA: NEGATIVE
Ketones, UA: NEGATIVE
Leukocytes, UA: NEGATIVE
Nitrite, UA: NEGATIVE
Protein, UA: POSITIVE — AB
Spec Grav, UA: 1.015 (ref 1.010–1.025)
Urobilinogen, UA: 1 E.U./dL
pH, UA: 7.5 (ref 5.0–8.0)

## 2020-08-25 LAB — CBC WITH DIFFERENTIAL/PLATELET
Basophils Absolute: 0 10*3/uL (ref 0.0–0.1)
Basophils Relative: 0.9 % (ref 0.0–3.0)
Eosinophils Absolute: 0 10*3/uL (ref 0.0–0.7)
Eosinophils Relative: 0.8 % (ref 0.0–5.0)
HCT: 43.4 % (ref 39.0–52.0)
Hemoglobin: 15.1 g/dL (ref 13.0–17.0)
Lymphocytes Relative: 27.4 % (ref 12.0–46.0)
Lymphs Abs: 1.4 10*3/uL (ref 0.7–4.0)
MCHC: 34.8 g/dL (ref 30.0–36.0)
MCV: 95.7 fl (ref 78.0–100.0)
Monocytes Absolute: 0.4 10*3/uL (ref 0.1–1.0)
Monocytes Relative: 8.6 % (ref 3.0–12.0)
Neutro Abs: 3.2 10*3/uL (ref 1.4–7.7)
Neutrophils Relative %: 62.3 % (ref 43.0–77.0)
Platelets: 227 10*3/uL (ref 150.0–400.0)
RBC: 4.53 Mil/uL (ref 4.22–5.81)
RDW: 12.4 % (ref 11.5–15.5)
WBC: 5.1 10*3/uL (ref 4.0–10.5)

## 2020-08-25 LAB — COMPREHENSIVE METABOLIC PANEL
ALT: 26 U/L (ref 0–53)
AST: 18 U/L (ref 0–37)
Albumin: 4.9 g/dL (ref 3.5–5.2)
Alkaline Phosphatase: 42 U/L (ref 39–117)
BUN: 15 mg/dL (ref 6–23)
CO2: 34 mEq/L — ABNORMAL HIGH (ref 19–32)
Calcium: 10.5 mg/dL (ref 8.4–10.5)
Chloride: 102 mEq/L (ref 96–112)
Creatinine, Ser: 1.26 mg/dL (ref 0.40–1.50)
GFR: 79.66 mL/min (ref >60.00)
Glucose, Bld: 90 mg/dL (ref 70–99)
Potassium: 4.2 mEq/L (ref 3.5–5.1)
Sodium: 140 mEq/L (ref 135–145)
Total Bilirubin: 0.8 mg/dL (ref 0.2–1.2)
Total Protein: 7.5 g/dL (ref 6.0–8.3)

## 2020-08-25 LAB — TSH: TSH: 2.01 u[IU]/mL (ref 0.35–4.50)

## 2020-08-29 ENCOUNTER — Ambulatory Visit: Payer: BC Managed Care – PPO | Admitting: Clinical

## 2020-08-30 DIAGNOSIS — F4312 Post-traumatic stress disorder, chronic: Secondary | ICD-10-CM | POA: Diagnosis not present

## 2020-09-01 ENCOUNTER — Other Ambulatory Visit: Payer: Self-pay

## 2020-09-01 ENCOUNTER — Ambulatory Visit: Payer: BC Managed Care – PPO | Admitting: Allergy

## 2020-09-01 ENCOUNTER — Encounter: Payer: Self-pay | Admitting: Allergy

## 2020-09-01 ENCOUNTER — Ambulatory Visit: Payer: BC Managed Care – PPO | Admitting: Clinical

## 2020-09-01 VITALS — BP 128/74 | HR 82 | Temp 98.2°F | Resp 16

## 2020-09-01 DIAGNOSIS — J4550 Severe persistent asthma, uncomplicated: Secondary | ICD-10-CM

## 2020-09-01 DIAGNOSIS — H1013 Acute atopic conjunctivitis, bilateral: Secondary | ICD-10-CM

## 2020-09-01 DIAGNOSIS — T7800XD Anaphylactic reaction due to unspecified food, subsequent encounter: Secondary | ICD-10-CM

## 2020-09-01 DIAGNOSIS — J3089 Other allergic rhinitis: Secondary | ICD-10-CM | POA: Diagnosis not present

## 2020-09-01 DIAGNOSIS — T7800XA Anaphylactic reaction due to unspecified food, initial encounter: Secondary | ICD-10-CM

## 2020-09-01 DIAGNOSIS — J4520 Mild intermittent asthma, uncomplicated: Secondary | ICD-10-CM | POA: Diagnosis not present

## 2020-09-01 DIAGNOSIS — F4312 Post-traumatic stress disorder, chronic: Secondary | ICD-10-CM | POA: Diagnosis not present

## 2020-09-01 MED ORDER — AIRDUO DIGIHALER 232-14 MCG/ACT IN AEPB: 1.0000 | INHALATION_SPRAY | Freq: Two times a day (BID) | RESPIRATORY_TRACT | 5 refills | Status: DC

## 2020-09-01 MED ORDER — LEVALBUTEROL HCL 1.25 MG/3ML IN NEBU: INHALATION_SOLUTION | RESPIRATORY_TRACT | 1 refills | Status: DC

## 2020-09-01 MED ORDER — ALVESCO 80 MCG/ACT IN AERS: 2.0000 | INHALATION_SPRAY | Freq: Two times a day (BID) | RESPIRATORY_TRACT | 5 refills | Status: DC

## 2020-09-01 NOTE — Progress Notes (Signed)
Immunotherapy   Patient Details  Name: James Matthews MRN: [id number] Date of Birth: 1996/06/15  09/01/2020  James Matthews started [place] injections today. He brought medication in with him and self adminitsered himself with no teaching needed. Consent signed and patient instructions given.   Exie Parody 09/01/2020, 3:39 PM

## 2020-09-01 NOTE — Progress Notes (Signed)
Follow-up Note  RE: James Matthews MRN: [id number] DOB: 05-01-1996 Date of Office Visit: 09/01/2020   History of present illness: James Matthews is a 25 y.o. male presenting today for follow-up of severe persistent asthma, allergic rhinitis with conjunctivitis and anaphylaxis due to food.  He presents today with his mother.  He was last seen in the office on 03/30/2020.  He has struggled with his asthma since the last visit.  He has required at least 1 course of prednisone in this timeframe if not more.  He states the prednisone does help somewhat but he has a lot of side effects as he does not feel himself but his behavior changes with it.  He would prefer not to have to use this medication if possible.  He states he still has trouble was showering with getting shortness of breath.  He has trouble with activity and wants to go to the gym but he cannot really exercise for long time as this causes shortness of breath symptoms.  He states he tried to go to [employer] once and was unable to walk around hard and had to leave due to the development of shortness of breath and and wheeze.  He does report having nighttime awakenings.  He is using his Xopenex about 1-2 times a day.  He has been on Breo 200 mcg 1 puff daily and Arnuity 100 mg 1 puff daily.  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  He is also been on new Calla injections monthly self administer at home.  He has not had any issues with self administration. He does take Zyrtec as needed and will typically take daily during the fall season.  He feels he may need to start taking it daily in prep for spring season.  Had a day and nasal steroid spray like Flonase have been recommended for ocular and nasal symptoms. He continues to avoid tree nuts and has not had any accidental ingestions or need to use his epinephrine device.  Review of systems: Review of  Systems  Constitutional: Negative.   HENT: Negative.   Eyes: Negative.   Respiratory: Positive for cough, shortness of breath and wheezing.   Cardiovascular: Negative.   Gastrointestinal: Negative.   Musculoskeletal: Negative.   Skin: Negative.   Neurological: Negative.     All other systems negative unless noted above in HPI  Past medical/social/surgical/family history have been reviewed and are unchanged unless specifically indicated below.  No changes  Medication List: Current Outpatient Medications  Medication Sig Dispense Refill  . ALPRAZolam (XANAX) 1 MG tablet Take 1 mg by mouth 2 (two) times daily as needed for anxiety.     . busPIRone (BUSPAR) 30 MG tablet TAKE 1 TABLET BY MOUTH TWICE A DAY 180 tablet 1  . ciclesonide (ALVESCO) 80 MCG/ACT inhaler Inhale 2 puffs into the lungs 2 (two) times daily. 1 each 5  . Desvenlafaxine Succinate (PRISTIQ PO) Take 150 mg by mouth. Take 1 tablet in the morning    . EPINEPHRINE 0.3 mg/0.3 mL IJ SOAJ injection INJECT 0.3 MLS INTO THE MUSCLE ONCE FOR 1 DOSE. AS DIRECTED FOR LIFE-THREATENING ALLERGIC REACTIONS 2 each 1  . Erenumab-aooe (AIMOVIG) 140 MG/ML SOAJ Inject 140 mg into the skin every 30 (thirty) days. 1 pen 11  . eszopiclone (LUNESTA) 2 MG TABS tablet Take 2 mg by mouth at bedtime.    . Fluticasone-Salmeterol,sensor, (AIRDUO DIGIHALER) [phone] MCG/ACT AEPB  Inhale 1 puff into the lungs in the morning and at bedtime. 1 each 5  . levalbuterol (XOPENEX HFA) 45 MCG/ACT inhaler TAKE 2 PUFFS BY MOUTH EVERY 4 HOURS AS NEEDED FOR WHEEZE 15 each 12  . levalbuterol (XOPENEX) 1.25 MG/3ML nebulizer solution 1 vial via nebulizer every 4-6 hours as needed. 75 mL 1  . losartan (COZAAR) 100 MG tablet Take 1 tablet by mouth every evening.     . ondansetron (ZOFRAN-ODT) 8 MG disintegrating tablet TAKE 1 TABLET (8 MG TOTAL) BY MOUTH EVERY 8 (EIGHT) HOURS AS NEEDED FOR NAUSEA OR VOMITING. 20 tablet 3  . prazosin (MINIPRESS) 2 MG capsule Take 4 mg by mouth  at bedtime.     . rizatriptan (MAXALT) 10 MG tablet Take 1 tablet earliest onset of migraine.  May repeat in 2 hours if needed.  Maximum 2 tablets in 24 hours 10 tablet 5   Current Facility-Administered Medications  Medication Dose Route Frequency Provider Last Rate Last Admin  . Mepolizumab SOLR 100 mg  100 mg Subcutaneous Q28 days Marcelyn Bruins, MD   100 mg at 01/12/20 1725     Known medication allergies: Allergies  Allergen Reactions  . Albuterol     High HR  . Chocolate     Face swelling/sounds like angioedema "I had to go to urgent care today due to an allergic reaction to a chocolate candy bar. My throat swelled up and was closing, my nasal passages were congested, and my face was hurting. They gave me a steroid shot at the clinic, and then they sent me home presidone to take for a week" 06/2019  . Lisinopril Other (See Comments)  . Other Other (See Comments)    Pain med? Unsure of medication, caused n/v     Physical examination: Blood pressure 128/74, pulse 82, temperature 98.2 F (36.8 C), temperature source Temporal, resp. rate 16, SpO2 100 %.  General: Alert, interactive, in no acute distress. HEENT: PERRLA, TMs pearly gray, turbinates non-edematous without discharge, post-pharynx non erythematous. Neck: Supple without lymphadenopathy. Lungs: Mildly decreased breath sounds bilaterally without wheezing, rhonchi or rales. {no increased work of breathing. CV: Normal S1, S2 without murmurs. Abdomen: Nondistended, nontender. Skin: Warm and dry, without lesions or rashes. Extremities:  No clubbing, cyanosis or edema. Neuro:   Grossly intact.  Diagnositics/Labs:  Spirometry: FEV1: 5.16 L 104%, FVC: 6.12 L 101%, ratio consistent with Nonobstructive pattern  Assessment and plan:   Asthma, severe persistent - at this time not well controlled.  Will change up regimen as below - have access to levalbuterol inhaler 2 puffs or Xopenex 1 vial via nebulizer every 4-6  hours as needed for cough/wheeze/shortness of breath/chest tightness.  May use 15-20 minutes prior to activity.   Monitor frequency of use.   Provided with nebulizer today.  - stop Breo and Arnuity.  Start AirDuo 1 puff twice a day with Alvesco  2 puffs twice a day.  Use Alvesco with spacer device.   - stop Nucala.  Start Fasenra injection (self-administered in office today) and take monthly x 3 months for loading dose then after that every 2 months for maintenance.   Asthma control goals:   Full participation in all desired activities (may need albuterol before activity)  Albuterol use two time or less a week on average (not counting use with activity)  Cough interfering with sleep two time or less a month  Oral steroids no more than once a year  No hospitalizations  Allergic  rhinitis with conjunctivitis - continue avoidance measures for grass pollens, weed pollens, tree pollens, molds, dust mite, cat, mouse, mixed feathers - continue Zyrtec 10mg  daily thru fall (weed pollen season ends at the first frost)   - for watery eyes can use of Pataday 1 drop each eye daily as needed - for nasal congestion use of nasal steroid spray like Flonase no more than 3 times a week due to nosebleeds.  Provided with nasal saline gel to help moisturize the nose a bit better to decrease risk of nosebleeds. - if medication management is not effective enough then consider allergen immunotherapy (allergy shots) and will be eligible once asthma is under good control before considering this option.  Anaphylaxis due to food  - continue avoidance of tree nuts  - have access to self-injectable epinephrine AuviQ 0.3mg  at all times  - follow emergency action plan in case of allergic reaction  Follow-up in 3 months or sooner if needed  I appreciate the opportunity to take part in Haldon's care. Please do not hesitate to contact me with questions.  Sincerely,   ,  MD Allergy/Immunology [facility]

## 2020-09-01 NOTE — Addendum Note (Signed)
Addended by: Orson Aloe on: 09/01/2020 07:07 PM   Modules accepted: Orders

## 2020-09-01 NOTE — Patient Instructions (Signed)
Asthma  - at this time not well controlled.  Will change up regimen as below - have access to levalbuterol inhaler 2 puffs or Xopenex 1 vial via nebulizer every 4-6 hours as needed for cough/wheeze/shortness of breath/chest tightness.  May use 15-20 minutes prior to activity.   Monitor frequency of use.   Provided with nebulizer today.  - stop Breo and Arnuity.  Start AirDuo 1 puff twice a day with Alvesco  2 puffs twice a day.  Use Alvesco with spacer device.   - stop Nucala.  Start Fasenra injection (self-administered in office today) and take monthly x 3 months for loading dose then after that every 2 months for maintenance.   Asthma control goals:   Full participation in all desired activities (may need albuterol before activity)  Albuterol use two time or less a week on average (not counting use with activity)  Cough interfering with sleep two time or less a month  Oral steroids no more than once a year  No hospitalizations  Allergies - continue avoidance measures for grass pollens, weed pollens, tree pollens, molds, dust mite, cat, mouse, mixed feathers - continue Zyrtec 10mg  daily thru fall (weed pollen season ends at the first frost)   - for watery eyes can use of Pataday 1 drop each eye daily as needed - for nasal congestion use of nasal steroid spray like Flonase no more than 3 times a week due to nosebleeds.  Provided with nasal saline gel to help moisturize the nose a bit better to decrease risk of nosebleeds. - if medication management is not effective enough then consider allergen immunotherapy (allergy shots) and will be eligible once asthma is under good control before considering this option.  Allergic reaction  - continue avoidance of tree nuts  - have access to self-injectable epinephrine AuviQ 0.3mg  at all times  - follow emergency action plan in case of allergic reaction  Follow-up in 3 months or sooner if needed

## 2020-09-02 ENCOUNTER — Encounter: Payer: Self-pay | Admitting: Radiology

## 2020-09-02 ENCOUNTER — Encounter: Payer: Self-pay | Admitting: Internal Medicine

## 2020-09-02 ENCOUNTER — Ambulatory Visit: Payer: BC Managed Care – PPO | Admitting: Internal Medicine

## 2020-09-02 ENCOUNTER — Ambulatory Visit (INDEPENDENT_AMBULATORY_CARE_PROVIDER_SITE_OTHER): Payer: BC Managed Care – PPO

## 2020-09-02 VITALS — BP 112/50 | HR 108 | Ht 73.0 in | Wt 165.0 lb

## 2020-09-02 DIAGNOSIS — R55 Syncope and collapse: Secondary | ICD-10-CM | POA: Diagnosis not present

## 2020-09-02 DIAGNOSIS — R002 Palpitations: Secondary | ICD-10-CM

## 2020-09-02 DIAGNOSIS — I10 Essential (primary) hypertension: Secondary | ICD-10-CM

## 2020-09-02 DIAGNOSIS — I951 Orthostatic hypotension: Secondary | ICD-10-CM

## 2020-09-02 NOTE — Progress Notes (Signed)
Enrolled patient for a 14 day ZIo XT Monitor to be mailed to patients home.

## 2020-09-02 NOTE — Progress Notes (Signed)
Cardiology Office Note:    Date:  09/02/2020   ID:  James Matthews, DOB March 29, 1996, MRN [id number]  PCP:  Shelva Majestic, MD  [facility] Cardiologist:  Riley Lam MD [facility] Electrophysiologist:  None   CC: Syncope follow up  History of Present Illness:    James Matthews is a 25 y.o. male with a hx of HTN, CKD Stage II, eosinophilic moderate persistent asthma, Anxiety, Depression, and PTSD  who presents for syncope evaluation 05/27/2020  Provider note:  Has a mild verbal apraxia  Patient notes that he is doing worse.  Since ast visit notes that he has had low blood pressure and associated near passing out.  On the 23rd- notes that he had a dull ringing in his ear.  Stood up and his blood pressure was 90/52.  Relevant interval testing or therapy include no heart monitor or echocardiogram.  There are no interval hospital/ED visit, but thought about going to the ED on 08/31/20.    No chest pain or pressure .  No SOB/DOE and no PND/Orthopnea.  No weight gain or leg swelling.  Patient notes that he was having an increase in frequency of these events:  When he was on the treadmill he notes neck pain with fatigue and blurry vision:  Sat on the gym floor for 10 minutes.  Went to the gas station, and a Coke and chocolate and got better.  Supine blood pressure: 120/80; headaches only occasional when laying down.   Past Medical History:  Diagnosis Date  . Asthma   . Hypertension   . IBS (irritable bowel syndrome)    probiotic  . Posterior urethral valves   . Premature birth    3 months early  . Renal disorder   . Verbal apraxia     Past Surgical History:  Procedure Laterality Date  . ADENOIDECTOMY    . ESOPHAGEAL MANOMETRY N/A 01/23/2016   Procedure: ESOPHAGEAL MANOMETRY (EM);  Surgeon: Charna Elizabeth, MD;  Location: [facility];  Service: Endoscopy;  Laterality: N/A;  . LASIK     eye surgery july 2017  . NEPHRECTOMY     rght   . TONSILLECTOMY       Current Medications: Current Meds  Medication Sig  . ALPRAZolam (XANAX) 1 MG tablet Take 1 mg by mouth 2 (two) times daily as needed for anxiety.   . busPIRone (BUSPAR) 30 MG tablet TAKE 1 TABLET BY MOUTH TWICE A DAY  . ciclesonide (ALVESCO) 80 MCG/ACT inhaler Inhale 2 puffs into the lungs 2 (two) times daily.  Marland Kitchen Desvenlafaxine Succinate (PRISTIQ PO) Take 150 mg by mouth. Take 1 tablet in the morning  . EPINEPHRINE 0.3 mg/0.3 mL IJ SOAJ injection INJECT 0.3 MLS INTO THE MUSCLE ONCE FOR 1 DOSE. AS DIRECTED FOR LIFE-THREATENING ALLERGIC REACTIONS  . Erenumab-aooe (AIMOVIG) 140 MG/ML SOAJ Inject 140 mg into the skin every 30 (thirty) days.  . eszopiclone (LUNESTA) 2 MG TABS tablet Take 2 mg by mouth at bedtime.  . Fluticasone-Salmeterol,sensor, (AIRDUO DIGIHALER) 232-14 MCG/ACT AEPB Inhale 1 puff into the lungs in the morning and at bedtime.  . levalbuterol (XOPENEX HFA) 45 MCG/ACT inhaler TAKE 2 PUFFS BY MOUTH EVERY 4 HOURS AS NEEDED FOR WHEEZE  . levalbuterol (XOPENEX) 1.25 MG/3ML nebulizer solution 1 vial via nebulizer every 4-6 hours as needed.  Marland Kitchen losartan (COZAAR) 100 MG tablet Take 1 tablet by mouth every evening.   . ondansetron (ZOFRAN-ODT) 8 MG disintegrating tablet TAKE 1 TABLET (8 MG TOTAL) BY  MOUTH EVERY 8 (EIGHT) HOURS AS NEEDED FOR NAUSEA OR VOMITING.  . prazosin (MINIPRESS) 2 MG capsule Take 4 mg by mouth at bedtime.   . rizatriptan (MAXALT) 10 MG tablet Take 1 tablet earliest onset of migraine.  May repeat in 2 hours if needed.  Maximum 2 tablets in 24 hours   Current Facility-Administered Medications for the 09/02/20 encounter (Office Visit) with Christell Constanthandrasekhar, Luigi Stuckey A, MD  Medication  . Mepolizumab SOLR 100 mg     Allergies:   Albuterol, Chocolate, Lisinopril, and Other   Social History   Socioeconomic History  . Marital status: Single    Spouse name: Not on file  . Number of children: Not on file  . Years of education: Not on file  . Highest education level: Not  on file  Occupational History  . Not on file  Tobacco Use  . Smoking status: Never Smoker  . Smokeless tobacco: Never Used  Vaping Use  . Vaping Use: Not on file  Substance and Sexual Activity  . Alcohol use: No  . Drug use: No  . Sexual activity: Not on file  Other Topics Concern  . Not on file  Social History Narrative   Single. Lives with mom and dad. His brother and wife and nephew also live with him. Brother and sister- healthy.       Full time at [employer]. Studying psychology with biology minor.    Thinking grad school for psychology- would plan for research over clinical   Right handed   Two story home   Does drink caffeine, one large coffee daily   Social Determinants of Health   Financial Resource Strain: Not on file  Food Insecurity: Not on file  Transportation Needs: Not on file  Physical Activity: Not on file  Stress: Not on file  Social Connections: Not on file    Family History: The patient's family history includes Breast cancer in his mother; Heart disease in his maternal grandfather; Hyperlipidemia in his mother; Hypertension in his mother; Melanoma in his sister; Other in his father; Ovarian cancer in his maternal aunt; Prostate cancer in his maternal grandfather and paternal grandfather. Grandfather had Head Disease NOS; died at age 25; heart attacks in his 3430s Aunt has had coronary artery disease No history of SCD, drownings or accidents. Brother has mitral valve proplapse  ROS:   Please see the history of present illness.    All other systems reviewed and are negative.  EKGs/Labs/Other Studies Reviewed:    The following studies were reviewed today:  EKG:   05/09/20  SR 88, LVH with repolarization  Recent Labs: 08/25/2020: ALT 26; BUN 15; Creatinine, Ser 1.26; Hemoglobin 15.1; Platelets 227.0; Potassium 4.2; Sodium 140; TSH 2.01  Recent Lipid Panel    Component Value Date/Time   CHOL 119 05/25/2019 1410   TRIG 95.0 05/25/2019 1410    HDL 36.90 (L) 05/25/2019 1410   CHOLHDL 3 05/25/2019 1410   VLDL 19.0 05/25/2019 1410   LDLCALC 64 05/25/2019 1410    Risk Assessment/Calculations:     None  Physical Exam:    VS:  BP (!) 112/50   Pulse (!) 108   Ht 6\' 1"  (1.854 m)   Wt 165 lb (74.8 kg)   SpO2 97%   BMI 21.77 kg/m     Orthostatic Vitals: Supine:  BP 112/58  HR 103 Sitting:  BP 112/50  HR 108 Standing:  BP 114/48  HR 116 Prolonged Stand:  BP 124/60  HR 121  Wt Readings from Last 3 Encounters:  09/02/20 165 lb (74.8 kg)  05/27/20 174 lb (78.9 kg)  05/06/20 165 lb (74.8 kg)    GEN: Well nourished, well developed in no acute distress HEENT: Normal NECK: No JVD; No carotid bruits LYMPHATICS: No lymphadenopathy CARDIAC: RRR, no murmurs, rubs, gallops RESPIRATORY:  Clear to auscultation without rales, wheezing or rhonchi  ABDOMEN: Soft, non-tender, non-distended MUSCULOSKELETAL:  No edema; No deformity  SKIN: Warm and dry NEUROLOGIC:  Alert and oriented x 3 PSYCHIATRIC:  Normal affect   ASSESSMENT:    1. Syncope, unspecified syncope type   2. Orthostatic hypotension   3. Palpitations   4. Essential hypertension    PLAN:    Syncope With palpitations - Echocardiogram - ZioPatch 14 day non-live  Orthostatic Hypotension With Hypertension and CK Stage II (prior kidney loss) - symptomatic - stress the importance of salt and water intake - on TCAs, alpha agonists, beta antagonists suitable for discontinuation - urine sodium goal of 150 mEq (will check) - gave education on slow rise, Valsalva maneuver exacerbation, temperature change - discussed muscle contraction and leg crossing - discussed elevation to 30-45 degrees when sleeping - exercise deconditioning exacerbates this issue, optimally benefits from bike/row or swimming - offered compression stockings and abdominal binders - caffeine for post prandial symptoms with hydration   Medication Adjustments/Labs and Tests Ordered: Current  medicines are reviewed at length with the patient today.  Concerns regarding medicines are outlined above.  Orders Placed This Encounter  Procedures  . Sodium, urine, random  . LONG TERM MONITOR (3-14 DAYS)  . ECHOCARDIOGRAM COMPLETE   No orders of the defined types were placed in this encounter.   Patient Instructions  Medication Instructions:  Your physician recommends that you continue on your current medications as directed. Please refer to the Current Medication list given to you today.  *If you need a refill on your cardiac medications before your next appointment, please call your pharmacy*   Lab Work: TODAY: random sodium urine  If you have labs (blood work) drawn today and your tests are completely normal, you will receive your results only by: Marland Kitchen MyChart Message (if you have MyChart) OR . A paper copy in the mail If you have any lab test that is abnormal or we need to change your treatment, we will call you to review the results.   Testing/Procedures: Your physician has requested that you wear a heart monitor.   Your physician has requested that you have an echocardiogram. Echocardiography is a painless test that uses sound waves to create images of your heart. It provides your doctor with information about the size and shape of your heart and how well your heart's chambers and valves are working. This procedure takes approximately one hour. There are no restrictions for this procedure.     Follow-Up: At [facility], you and your health needs are our priority.  As part of our continuing mission to provide you with exceptional heart care, we have created designated Provider Care Teams.  These Care Teams include your primary Cardiologist (physician) and Advanced Practice Providers (APPs -  Physician Assistants and Nurse Practitioners) who all work together to provide you with the care you need, when you need it.  We recommend signing up for the patient portal called  "MyChart".  Sign up information is provided on this After Visit Summary.  MyChart is used to connect with patients for Virtual Visits (Telemedicine).  Patients are able to view lab/test results, encounter  notes, upcoming appointments, etc.  Non-urgent messages can be sent to your provider as well.   To learn more about what you can do with MyChart, go to [email or web address].    Your next appointment:   4 month(s)  The format for your next appointment:   In Person  Provider:   You may see Izora Ribas, MD or one of the following Advanced Practice Providers on your designated Care Team:    Ronie Spies, PA-C  Jacolyn Reedy, PA-C    Other Instructions Christena Deem- Long Term Monitor Instructions   Your physician has requested you wear your ZIO patch monitor___14____days.   This is a single patch monitor.  Irhythm supplies one patch monitor per enrollment.  Additional stickers are not available.   Please do not apply patch if you will be having a Nuclear Stress Test, Echocardiogram, Cardiac CT, MRI, or Chest Xray during the time frame you would be wearing the monitor. The patch cannot be worn during these tests.  You cannot remove and re-apply the ZIO XT patch monitor.   Your ZIO patch monitor will be sent USPS Priority mail from [facility] directly to your home address. The monitor may also be mailed to a PO BOX if home delivery is not available.   It may take 3-5 days to receive your monitor after you have been enrolled.   Once you have received you monitor, please review enclosed instructions.  Your monitor has already been registered assigning a specific monitor serial # to you.   Applying the monitor   Shave hair from upper left chest.   Hold abrader disc by orange tab.  Rub abrader in 40 strokes over left upper chest as indicated in your monitor instructions.   Clean area with 4 enclosed alcohol pads .  Use all pads to assure are is cleaned thoroughly.  Let dry.    Apply patch as indicated in monitor instructions.  Patch will be place under collarbone on left side of chest with arrow pointing upward.   Rub patch adhesive wings for 2 minutes.Remove white label marked "1".  Remove white label marked "2".  Rub patch adhesive wings for 2 additional minutes.   While looking in a mirror, press and release button in center of patch.  A small green light will flash 3-4 times .  This will be your only indicator the monitor has been turned on.     Do not shower for the first 24 hours.  You may shower after the first 24 hours.   Press button if you feel a symptom. You will hear a small click.  Record Date, Time and Symptom in the Patient Log Book.   When you are ready to remove patch, follow instructions on last 2 pages of Patient Log Book.  Stick patch monitor onto last page of Patient Log Book.   Place Patient Log Book in [place] box.  Use locking tab on box and tape box closed securely.  The Orange and [employer] has [employer] on it.  Please place in mailbox as soon as possible.  Your physician should have your test results approximately 7 days after the monitor has been mailed back to [facility].   Call [facility] Customer Care at [phone] if you have questions regarding your ZIO XT patch monitor.  Call them immediately if you see an orange light blinking on your monitor.   If your monitor falls off in less than 4 days contact our Monitor department at [phone].  If your monitor becomes loose or falls off after 4 days call [employer] at [phone] for suggestions on securing your monitor.       Signed, Christell Constant, MD  09/02/2020 2:34 PM    [facility]

## 2020-09-02 NOTE — Patient Instructions (Addendum)
Medication Instructions:  Your physician recommends that you continue on your current medications as directed. Please refer to the Current Medication list given to you today.  *If you need a refill on your cardiac medications before your next appointment, please call your pharmacy*   Lab Work: TODAY: random sodium urine  If you have labs (blood work) drawn today and your tests are completely normal, you will receive your results only by: Marland Kitchen MyChart Message (if you have MyChart) OR . A paper copy in the mail If you have any lab test that is abnormal or we need to change your treatment, we will call you to review the results.   Testing/Procedures: Your physician has requested that you wear a heart monitor.   Your physician has requested that you have an echocardiogram. Echocardiography is a painless test that uses sound waves to create images of your heart. It provides your doctor with information about the size and shape of your heart and how well your heart's chambers and valves are working. This procedure takes approximately one hour. There are no restrictions for this procedure.     Follow-Up: At [facility], you and your health needs are our priority.  As part of our continuing mission to provide you with exceptional heart care, we have created designated Provider Care Teams.  These Care Teams include your primary Cardiologist (physician) and Advanced Practice Providers (APPs -  Physician Assistants and Nurse Practitioners) who all work together to provide you with the care you need, when you need it.  We recommend signing up for the patient portal called "MyChart".  Sign up information is provided on this After Visit Summary.  MyChart is used to connect with patients for Virtual Visits (Telemedicine).  Patients are able to view lab/test results, encounter notes, upcoming appointments, etc.  Non-urgent messages can be sent to your provider as well.   To learn more about what you can do  with MyChart, go to [email or web address].    Your next appointment:   4 month(s)  The format for your next appointment:   In Person  Provider:   You may see Izora Ribas, MD or one of the following Advanced Practice Providers on your designated Care Team:    Ronie Spies, PA-C  Jacolyn Reedy, PA-C    Other Instructions James Matthews- Long Term Monitor Instructions   Your physician has requested you wear your ZIO patch monitor___14____days.   This is a single patch monitor.  Irhythm supplies one patch monitor per enrollment.  Additional stickers are not available.   Please do not apply patch if you will be having a Nuclear Stress Test, Echocardiogram, Cardiac CT, MRI, or Chest Xray during the time frame you would be wearing the monitor. The patch cannot be worn during these tests.  You cannot remove and re-apply the ZIO XT patch monitor.   Your ZIO patch monitor will be sent USPS Priority mail from [facility] directly to your home address. The monitor may also be mailed to a PO BOX if home delivery is not available.   It may take 3-5 days to receive your monitor after you have been enrolled.   Once you have received you monitor, please review enclosed instructions.  Your monitor has already been registered assigning a specific monitor serial # to you.   Applying the monitor   Shave hair from upper left chest.   Hold abrader disc by orange tab.  Rub abrader in 40 strokes over left upper chest as indicated in  your monitor instructions.   Clean area with 4 enclosed alcohol pads .  Use all pads to assure are is cleaned thoroughly.  Let dry.   Apply patch as indicated in monitor instructions.  Patch will be place under collarbone on left side of chest with arrow pointing upward.   Rub patch adhesive wings for 2 minutes.Remove white label marked "1".  Remove white label marked "2".  Rub patch adhesive wings for 2 additional minutes.   While looking in a mirror, press and  release button in center of patch.  A small green light will flash 3-4 times .  This will be your only indicator the monitor has been turned on.     Do not shower for the first 24 hours.  You may shower after the first 24 hours.   Press button if you feel a symptom. You will hear a small click.  Record Date, Time and Symptom in the Patient Log Book.   When you are ready to remove patch, follow instructions on last 2 pages of Patient Log Book.  Stick patch monitor onto last page of Patient Log Book.   Place Patient Log Book in [place] box.  Use locking tab on box and tape box closed securely.  The Orange and [employer] has [employer] on it.  Please place in mailbox as soon as possible.  Your physician should have your test results approximately 7 days after the monitor has been mailed back to [facility].   Call [facility] Customer Care at [phone] if you have questions regarding your ZIO XT patch monitor.  Call them immediately if you see an orange light blinking on your monitor.   If your monitor falls off in less than 4 days contact our Monitor department at [phone].  If your monitor becomes loose or falls off after 4 days call [employer] at [phone] for suggestions on securing your monitor.

## 2020-09-03 DIAGNOSIS — F411 Generalized anxiety disorder: Secondary | ICD-10-CM | POA: Diagnosis not present

## 2020-09-03 DIAGNOSIS — R482 Apraxia: Secondary | ICD-10-CM | POA: Diagnosis not present

## 2020-09-03 DIAGNOSIS — F330 Major depressive disorder, recurrent, mild: Secondary | ICD-10-CM | POA: Diagnosis not present

## 2020-09-03 DIAGNOSIS — F5082 Avoidant/restrictive food intake disorder: Secondary | ICD-10-CM | POA: Diagnosis not present

## 2020-09-03 LAB — SODIUM, URINE, RANDOM: Sodium, Ur: 42 mmol/L

## 2020-09-05 ENCOUNTER — Ambulatory Visit: Payer: BC Managed Care – PPO | Admitting: Clinical

## 2020-09-06 ENCOUNTER — Other Ambulatory Visit: Payer: Self-pay | Admitting: *Deleted

## 2020-09-06 MED ORDER — AIRDUO DIGIHALER 232-14 MCG/ACT IN AEPB: 1.0000 | INHALATION_SPRAY | Freq: Two times a day (BID) | RESPIRATORY_TRACT | 5 refills | Status: DC

## 2020-09-06 MED ORDER — ALVESCO 80 MCG/ACT IN AERS
2.0000 | INHALATION_SPRAY | Freq: Two times a day (BID) | RESPIRATORY_TRACT | 5 refills | Status: DC
Start: 2020-09-06 — End: 2020-09-08

## 2020-09-07 DIAGNOSIS — R55 Syncope and collapse: Secondary | ICD-10-CM

## 2020-09-07 DIAGNOSIS — R002 Palpitations: Secondary | ICD-10-CM

## 2020-09-07 DIAGNOSIS — F4312 Post-traumatic stress disorder, chronic: Secondary | ICD-10-CM | POA: Diagnosis not present

## 2020-09-08 ENCOUNTER — Ambulatory Visit (INDEPENDENT_AMBULATORY_CARE_PROVIDER_SITE_OTHER): Payer: BC Managed Care – PPO | Admitting: Clinical

## 2020-09-08 ENCOUNTER — Other Ambulatory Visit: Payer: Self-pay

## 2020-09-08 ENCOUNTER — Other Ambulatory Visit: Payer: Self-pay | Admitting: Allergy

## 2020-09-08 DIAGNOSIS — F331 Major depressive disorder, recurrent, moderate: Secondary | ICD-10-CM | POA: Diagnosis not present

## 2020-09-08 MED ORDER — ALVESCO 80 MCG/ACT IN AERS: 2.0000 | INHALATION_SPRAY | Freq: Two times a day (BID) | RESPIRATORY_TRACT | 5 refills | Status: DC

## 2020-09-08 MED ORDER — ALVESCO 80 MCG/ACT IN AERS
2.0000 | INHALATION_SPRAY | Freq: Two times a day (BID) | RESPIRATORY_TRACT | 5 refills | Status: DC
Start: 2020-09-08 — End: 2020-09-08

## 2020-09-08 MED ORDER — ALVESCO 80 MCG/ACT IN AERS
2.0000 | INHALATION_SPRAY | Freq: Two times a day (BID) | RESPIRATORY_TRACT | 5 refills | Status: DC
Start: 2020-09-08 — End: 2020-12-01

## 2020-09-08 NOTE — Telephone Encounter (Signed)
Patient called stating his insurance doesn't cover Alvesco. Patient is wondering if we can do a Prior Authorization. Patient is also requesting more samples of Alvesco till we can get the medication approved or switched to a different medication.

## 2020-09-08 NOTE — Telephone Encounter (Signed)
A copay card was attached to this prescription. Will need to call pharmacy to see if they tried to run the copay card.

## 2020-09-08 NOTE — Addendum Note (Signed)
Addended by: Grier Rocher on: 09/08/2020 02:47 PM   Modules accepted: Orders

## 2020-09-08 NOTE — Progress Notes (Addendum)
Alvesco 80 mcg was sent in to [facility] due to being a specialist with Alvesco @ [phone]. Patient is made aware of prescription sent to pharmacy.  Patient was also given the name and number of pharmacy

## 2020-09-09 NOTE — Telephone Encounter (Signed)
No PA needed for the change from ODT to regular tablets. Patient has already picked up the Rizatriptan 10mg  Tablets per Pharmacy.

## 2020-09-12 ENCOUNTER — Ambulatory Visit: Payer: BC Managed Care – PPO | Admitting: Clinical

## 2020-09-13 DIAGNOSIS — F4312 Post-traumatic stress disorder, chronic: Secondary | ICD-10-CM | POA: Diagnosis not present

## 2020-09-15 ENCOUNTER — Ambulatory Visit: Payer: BC Managed Care – PPO | Admitting: Clinical

## 2020-09-15 NOTE — Progress Notes (Signed)
Phone [phone] In person visit   Subjective:   James Matthews is a 25 y.o. year old very pleasant male patient who presents for/with See problem oriented charting Chief Complaint  Patient presents with  . Hypertension    Follow up    This visit occurred during the SARS-CoV-2 public health emergency.  Safety protocols were in place, including screening questions prior to the visit, additional usage of staff PPE, and extensive cleaning of exam room while observing appropriate contact time as indicated for disinfecting solutions.   Past Medical History-  Patient Active Problem List   Diagnosis Date Noted  . Daily headache 06/02/2019    Priority: High  . Asthma, severe persistent 05/25/2019    Priority: High  . Major depression, recurrent (HCC) 06/04/2016    Priority: High  . Verbal apraxia     Priority: High  . PTSD (post-traumatic stress disorder) 10/19/2019    Priority: Medium  . Posterior urethral valves (PUV) 06/04/2016    Priority: Medium  . Essential hypertension 06/04/2016    Priority: Medium  . Bladder spasms 06/04/2016    Priority: Medium  . GAD (generalized anxiety disorder) 06/04/2016    Priority: Medium  . CKD (chronic kidney disease), stage II 06/04/2016    Priority: Medium  . Mesenteric lymphadenopathy 07/27/2016    Priority: Low  . GERD (gastroesophageal reflux disease) 06/04/2016    Priority: Low  . Premature birth     Priority: Low  . IBS (irritable bowel syndrome)     Priority: Low  . Orthostatic hypotension 09/02/2020  . Syncope 05/27/2020  . Palpitations 05/27/2020  . Social anxiety disorder 05/04/2018  . Insomnia 05/04/2018    Medications- reviewed and updated Current Outpatient Medications  Medication Sig Dispense Refill  . AIRDUO DIGIHALER 232-14 MCG/ACT AEPB Inhale 1 puff into the lungs in the morning and at bedtime. 1 each 5  . ALPRAZolam (XANAX) 1 MG tablet Take 1 mg by mouth 2 (two) times daily as needed for anxiety.     Marland Kitchen  ALVESCO 80 MCG/ACT inhaler Inhale 2 puffs into the lungs 2 (two) times daily. 2 each 5  . busPIRone (BUSPAR) 30 MG tablet TAKE 1 TABLET BY MOUTH TWICE A DAY 180 tablet 1  . Desvenlafaxine Succinate (PRISTIQ PO) Take 150 mg by mouth. Take 1 tablet in the morning    . EPINEPHRINE 0.3 mg/0.3 mL IJ SOAJ injection INJECT 0.3 MLS INTO THE MUSCLE ONCE FOR 1 DOSE. AS DIRECTED FOR LIFE-THREATENING ALLERGIC REACTIONS 2 each 1  . Erenumab-aooe (AIMOVIG) 140 MG/ML SOAJ Inject 140 mg into the skin every 30 (thirty) days. 1 pen 11  . eszopiclone (LUNESTA) 2 MG TABS tablet Take 2 mg by mouth at bedtime.    . levalbuterol (XOPENEX HFA) 45 MCG/ACT inhaler TAKE 2 PUFFS BY MOUTH EVERY 4 HOURS AS NEEDED FOR WHEEZE 15 each 12  . levalbuterol (XOPENEX) 1.25 MG/3ML nebulizer solution 1 vial via nebulizer every 4-6 hours as needed. 75 mL 1  . losartan (COZAAR) 100 MG tablet Take 1 tablet by mouth every evening.     . ondansetron (ZOFRAN-ODT) 8 MG disintegrating tablet TAKE 1 TABLET (8 MG TOTAL) BY MOUTH EVERY 8 (EIGHT) HOURS AS NEEDED FOR NAUSEA OR VOMITING. 20 tablet 3  . prazosin (MINIPRESS) 2 MG capsule Take 4 mg by mouth at bedtime.     . rizatriptan (MAXALT) 10 MG tablet Take 1 tablet earliest onset of migraine.  May repeat in 2 hours if needed.  Maximum 2 tablets in  24 hours 10 tablet 5   Current Facility-Administered Medications  Medication Dose Route Frequency Provider Last Rate Last Admin  . Mepolizumab SOLR 100 mg  100 mg Subcutaneous Q28 days Marcelyn Bruins, MD   100 mg at 01/12/20 1725     Objective:  BP 118/68   Pulse (!) 103   Temp 98.2 F (36.8 C) (Temporal)   Ht 6\' 1"  (1.854 m)   Wt 165 lb (74.8 kg) Comment: Patient reported.  SpO2 98%   BMI 21.77 kg/m  Gen: NAD, resting comfortably CV: RRR (not tachycardic on my exam but was high normal) no murmurs rubs or gallops Lungs: CTAB no crackles, wheeze, rhonchi Ext: no edema Skin: warm, dry     Assessment and Plan     #hypertension/orthostatic hypotension S: medication: Losartan 50 mg lowered from 50 mg last visit.  Last visit patient reported 2 episodes of feeling poorly with blurry vision, body aches, dizziness with blood pressures less than 100 systolic.  Reassuring neurological exam last visit.  Reinforced good hydration.  Basic labs reassuring  Patient had syncopal episode last year and was to wear cardiac monitor but that got lost-he wanted to check in with cardiology before reordering-they ordered Zio patch 14-day and  echocardiogram  Cardiology was worried about TCA, alpha agonist, beta agonist as potential cause  They also discussed regular exercise, elevation of head of bed, compression stockings and abdominal binders  Urine sodium slightly low was encouraged to increase salt intake  Today patient reports since last visit with me on 08/25/20  He had episode on 08/31/20- busy day and out most of day (tired, tinnitus noted, felt like he would pass out when stood up, BP 90/52 sitting and HR 125- drank coke, BP 127/76). Another episode march 6th (BP earlier in day 111/73 with pulse 111 and did not feel well later in day- BP 93/41 and pulse 129- salt in cup of water and ate chocolate - BP standing up 107/54 and pulse 145- with sitting 118/52 and pulse down to 103). Another episode last night- trying to carry groceries to car- had some groceries to car (bp 81/41- drank salt water and then coke and came up to 99/41 and pulse 88 and stood up 117/57 and HR 118)  Has tried to increase salt/have more electrolytes. Regular exercise not yet. 3L of water a day up from 2L and gatorade or 2 per day  On other days BP 130/72, 138/89 BP Readings from Last 3 Encounters:  09/16/20 118/68  09/02/20 (!) 112/50  09/01/20 128/74  A/P: Patient with orthostatic hypotension symptoms.  Currently undergoing cardiology work-up with history of syncope last year with [facility]ology (echocardiogram in less than 2 weeks and  currently wearing Zio patch for 2 weeks) -Patient states he would feel more confident if he got a second opinion-he requested to see [facility]. [facility] but I told him without any clear electrical issues I would be unlikely to get him in with him.  I would likely have to come from internal referral for cardiology.  After that he asked for referral to [facility] cardiology-referral was placed today per his request.  I told him I thought the current work-up was appropriate as well as advice -Patient is very concerned about potential pots-we checked heart rate elevation with standing for 10 minutes and was approximately 25 increase which is under diagnostic criteria of 30.  I told him that the treatment still remains very similar-see cardiology's last note and reinforced with  after visit summary -This is clearly very frustrating for patient and I am hoping with some adjustments/conservative care we can help him feel better -Also strongly consider reducing losartan to 25 mg but he is going to run this by nephrology-I told him I was thankful for this as I was somewhat hesitant to reduce the dose further  Recommended follow up: We have a visit in approximately a month-we can keep that to check in Future Appointments  Date Time Provider Department Center  09/22/2020  3:00 PM Mendelson, Hulda Humphrey [facility] None  09/27/2020  2:05 PM [facility] 1 [id number]  10/06/2020  2:30 PM Drema Dallas, DO [facility] None  10/13/2020  2:40 PM Shelva Majestic, MD [facility]  10/13/2020  3:00 PM Mendelson, Hulda Humphrey [facility] None  12/01/2020  2:30 PM Marcelyn Bruins, MD [facility] None  12/21/2020  9:40 AM Christell Constant, MD [facility]    Lab/Order associations:   ICD-10-CM   1. Orthostatic hypotension  I95.1 Ambulatory referral to Cardiology    Time Spent: 38 minutes of total time (2:38 PM- 3:57 PM but with 41 minutes of this time spent with another patient to allow time for 10-minute  orthostatic testing to be completed) was spent on the date of the encounter performing the following actions: chart review prior to seeing the patient, obtaining history, performing a medically necessary exam, counseling on the treatment plan, placing orders, and documenting in our EHR.   Return precautions advised.  Tana Conch, MD

## 2020-09-15 NOTE — Patient Instructions (Addendum)
See nephrology's thoughts on decreasing to 25 mg of losartan- im a little hesitant to go down further without their input  We will call you within two weeks about your referral to Cardiology. If you do not hear within 2 weeks, give [place] a call.   Keep pushing fluids. Try low level of exercise daily and build up- I do not want you overexerting with this - gradually build. Consider compression stockings, abdominal binder as cardiology mentioned. Keep pushing fluids, salt, electrolytes- you have done a great job so far- sorry it has not helped more.

## 2020-09-16 ENCOUNTER — Other Ambulatory Visit: Payer: Self-pay

## 2020-09-16 ENCOUNTER — Ambulatory Visit (INDEPENDENT_AMBULATORY_CARE_PROVIDER_SITE_OTHER): Payer: BC Managed Care – PPO | Admitting: Family Medicine

## 2020-09-16 ENCOUNTER — Encounter: Payer: Self-pay | Admitting: Family Medicine

## 2020-09-16 VITALS — BP 118/68 | HR 103 | Temp 98.2°F | Ht 73.0 in | Wt 165.0 lb

## 2020-09-16 DIAGNOSIS — I10 Essential (primary) hypertension: Secondary | ICD-10-CM | POA: Diagnosis not present

## 2020-09-16 DIAGNOSIS — I951 Orthostatic hypotension: Secondary | ICD-10-CM | POA: Diagnosis not present

## 2020-09-19 ENCOUNTER — Ambulatory Visit: Payer: BC Managed Care – PPO | Admitting: Clinical

## 2020-09-20 DIAGNOSIS — F4312 Post-traumatic stress disorder, chronic: Secondary | ICD-10-CM | POA: Diagnosis not present

## 2020-09-21 ENCOUNTER — Encounter: Payer: Self-pay | Admitting: Physician Assistant

## 2020-09-21 ENCOUNTER — Other Ambulatory Visit: Payer: Self-pay

## 2020-09-21 ENCOUNTER — Ambulatory Visit (INDEPENDENT_AMBULATORY_CARE_PROVIDER_SITE_OTHER): Payer: BC Managed Care – PPO | Admitting: Physician Assistant

## 2020-09-21 VITALS — BP 127/81 | HR 78 | Temp 98.0°F | Ht 73.0 in

## 2020-09-21 DIAGNOSIS — S66911A Strain of unspecified muscle, fascia and tendon at wrist and hand level, right hand, initial encounter: Secondary | ICD-10-CM

## 2020-09-21 NOTE — Patient Instructions (Signed)
I think this is a strain of the smaller muscles in your right hand. As you cannot take NSAIDs, I recommend Tylenol Arthritis two to three times daily for the next 7 days. Use gentle ice massages for about 10 minutes three times daily. Try to limit phone use with this hand. Recheck in the next two weeks if worse or no improvement.

## 2020-09-21 NOTE — Progress Notes (Signed)
Acute Office Visit  Subjective:    Patient ID: James Matthews, male    DOB: 11/06/1995, 25 y.o.   MRN: [id number]  Chief Complaint  Patient presents with  . Hand Pain    X 4-5 days    HPI Patient is in today for right hand pain x 4-5 days. Right hand dominant. Not constant. If he is holding his phone or driving his car, notices the pain more persistent. No swelling or numbness. Still able to grip. He is on the computer often for work. No new injury, no new hobbies involving repetitive hand or wrist use. He has not taken any medications for this or tried any treatments yet.   Past Medical History:  Diagnosis Date  . Asthma   . Hypertension   . IBS (irritable bowel syndrome)    probiotic  . Orthostatic hypertension   . Posterior urethral valves   . Premature birth    3 months early  . Renal disorder   . Verbal apraxia     Past Surgical History:  Procedure Laterality Date  . ADENOIDECTOMY    . ESOPHAGEAL MANOMETRY N/A 01/23/2016   Procedure: ESOPHAGEAL MANOMETRY (EM);  Surgeon: Charna Elizabeth, MD;  Location: [facility];  Service: Endoscopy;  Laterality: N/A;  . LASIK     eye surgery july 2017  . NEPHRECTOMY     rght   . TONSILLECTOMY      Family History  Problem Relation Age of Onset  . Breast cancer Mother        survivor  . Hypertension Mother   . Hyperlipidemia Mother   . Other Father        pituitary tumor- gamma knife radiation  . Heart disease Maternal Grandfather   . Prostate cancer Maternal Grandfather        46s  . Melanoma Sister   . Prostate cancer Paternal Grandfather        60s  . Ovarian cancer Maternal Aunt        mets to liver     Social History   Socioeconomic History  . Marital status: Single    Spouse name: Not on file  . Number of children: Not on file  . Years of education: Not on file  . Highest education level: Not on file  Occupational History  . Not on file  Tobacco Use  . Smoking status: Never Smoker  . Smokeless  tobacco: Never Used  Vaping Use  . Vaping Use: Not on file  Substance and Sexual Activity  . Alcohol use: No  . Drug use: No  . Sexual activity: Not on file  Other Topics Concern  . Not on file  Social History Narrative   Single. Lives with mom and dad. His brother and wife and nephew also live with him. Brother and sister- healthy.       Full time at [employer]. Studying psychology with biology minor.    Thinking grad school for psychology- would plan for research over clinical   Right handed   Two story home   Does drink caffeine, one large coffee daily   Social Determinants of Health   Financial Resource Strain: Not on file  Food Insecurity: Not on file  Transportation Needs: Not on file  Physical Activity: Not on file  Stress: Not on file  Social Connections: Not on file  Intimate Partner Violence: Not on file    Outpatient Medications Prior to Visit  Medication Sig Dispense Refill  .  AIRDUO DIGIHALER 232-14 MCG/ACT AEPB Inhale 1 puff into the lungs in the morning and at bedtime. 1 each 5  . ALPRAZolam (XANAX) 1 MG tablet Take 1 mg by mouth 2 (two) times daily as needed for anxiety.     Marland Kitchen ALVESCO 80 MCG/ACT inhaler Inhale 2 puffs into the lungs 2 (two) times daily. 2 each 5  . busPIRone (BUSPAR) 30 MG tablet TAKE 1 TABLET BY MOUTH TWICE A DAY 180 tablet 1  . Desvenlafaxine Succinate (PRISTIQ PO) Take 150 mg by mouth. Take 1 tablet in the morning    . EPINEPHRINE 0.3 mg/0.3 mL IJ SOAJ injection INJECT 0.3 MLS INTO THE MUSCLE ONCE FOR 1 DOSE. AS DIRECTED FOR LIFE-THREATENING ALLERGIC REACTIONS 2 each 1  . Erenumab-aooe (AIMOVIG) 140 MG/ML SOAJ Inject 140 mg into the skin every 30 (thirty) days. 1 pen 11  . eszopiclone (LUNESTA) 2 MG TABS tablet Take 2 mg by mouth at bedtime.    . levalbuterol (XOPENEX HFA) 45 MCG/ACT inhaler TAKE 2 PUFFS BY MOUTH EVERY 4 HOURS AS NEEDED FOR WHEEZE 15 each 12  . levalbuterol (XOPENEX) 1.25 MG/3ML nebulizer solution 1 vial via nebulizer  every 4-6 hours as needed. 75 mL 1  . losartan (COZAAR) 100 MG tablet Take 1 tablet by mouth every evening.     . ondansetron (ZOFRAN-ODT) 8 MG disintegrating tablet TAKE 1 TABLET (8 MG TOTAL) BY MOUTH EVERY 8 (EIGHT) HOURS AS NEEDED FOR NAUSEA OR VOMITING. 20 tablet 3  . prazosin (MINIPRESS) 2 MG capsule Take 4 mg by mouth at bedtime.     . rizatriptan (MAXALT) 10 MG tablet Take 1 tablet earliest onset of migraine.  May repeat in 2 hours if needed.  Maximum 2 tablets in 24 hours 10 tablet 5   Facility-Administered Medications Prior to Visit  Medication Dose Route Frequency Provider Last Rate Last Admin  . Mepolizumab SOLR 100 mg  100 mg Subcutaneous Q28 days Marcelyn Bruins, MD   100 mg at 01/12/20 1725    Allergies  Allergen Reactions  . Albuterol     High HR  . Chocolate     Face swelling/sounds like angioedema "I had to go to urgent care today due to an allergic reaction to a chocolate candy bar. My throat swelled up and was closing, my nasal passages were congested, and my face was hurting. They gave me a steroid shot at the clinic, and then they sent me home presidone to take for a week" 06/2019  . Lisinopril Other (See Comments)  . Other Other (See Comments)    Pain med? Unsure of medication, caused n/v    Review of Systems REFER TO HPI FOR PERTINENT POSITIVES AND NEGATIVES     Objective:    Physical Exam Constitutional:      Appearance: Normal appearance.  Musculoskeletal:     Right hand: Tenderness (hypothenar eminence) present. No swelling, deformity, lacerations or bony tenderness. Normal range of motion. Normal strength. Normal sensation. There is no disruption of two-point discrimination. Normal capillary refill. Normal pulse.     Left hand: Normal.  Neurological:     Mental Status: He is alert.     BP 127/81   Pulse 78   Temp 98 F (36.7 C)   Ht 6\' 1"  (1.854 m)   SpO2 98%   BMI 21.77 kg/m  Wt Readings from Last 3 Encounters:  09/16/20 165 lb  (74.8 kg)  09/02/20 165 lb (74.8 kg)  05/27/20 174 lb (78.9 kg)  Assessment & Plan:   Problem List Items Addressed This Visit   None     1. Strain of muscle of right hand No obvious injury or deformity to the right hand.  Reassured patient that I think this is a strain of the smaller muscles of his hypothenar eminence, most likely secondary to phone overuse.  He states he holds his phone in a certain way for about 8 hours a day because of his numerous jobs.  We will treat conservatively at this time.  As he only has one kidney we need to avoid NSAIDs.  He also does not do well with steroids.  I think Tylenol arthritis is reasonable, as well as ice and massage to area.  I do not feel like an x-ray is necessary at this time.  He will recheck in a few weeks if this does not seem to be improving or anything changes.  This note was prepared with assistance of Conservation officer, historic buildings. Occasional wrong-word or sound-a-like substitutions may have occurred due to the inherent limitations of voice recognition software.  Time spent today with patient was approximately 20 minutes reviewing his history and doing physical exam as well as charting.  This took slightly longer than expected as he does have a speech disturbance.   Euline Kimbler M Lithzy Bernard, PA-C

## 2020-09-22 ENCOUNTER — Ambulatory Visit: Payer: BC Managed Care – PPO | Admitting: Clinical

## 2020-09-26 ENCOUNTER — Ambulatory Visit: Payer: BC Managed Care – PPO | Admitting: Clinical

## 2020-09-27 ENCOUNTER — Ambulatory Visit (HOSPITAL_COMMUNITY): Payer: BC Managed Care – PPO | Attending: Cardiovascular Disease

## 2020-09-27 ENCOUNTER — Other Ambulatory Visit: Payer: Self-pay

## 2020-09-27 DIAGNOSIS — R55 Syncope and collapse: Secondary | ICD-10-CM | POA: Diagnosis not present

## 2020-09-27 DIAGNOSIS — I951 Orthostatic hypotension: Secondary | ICD-10-CM | POA: Insufficient documentation

## 2020-09-27 DIAGNOSIS — F4312 Post-traumatic stress disorder, chronic: Secondary | ICD-10-CM | POA: Diagnosis not present

## 2020-09-27 LAB — ECHOCARDIOGRAM COMPLETE
Area-P 1/2: 3.91 cm2
S' Lateral: 3 cm

## 2020-09-29 ENCOUNTER — Ambulatory Visit: Payer: BC Managed Care – PPO | Admitting: Clinical

## 2020-09-29 DIAGNOSIS — R55 Syncope and collapse: Secondary | ICD-10-CM | POA: Diagnosis not present

## 2020-09-29 DIAGNOSIS — R002 Palpitations: Secondary | ICD-10-CM | POA: Diagnosis not present

## 2020-10-01 ENCOUNTER — Other Ambulatory Visit: Payer: Self-pay | Admitting: Neurology

## 2020-10-01 DIAGNOSIS — F411 Generalized anxiety disorder: Secondary | ICD-10-CM | POA: Diagnosis not present

## 2020-10-01 DIAGNOSIS — F5082 Avoidant/restrictive food intake disorder: Secondary | ICD-10-CM | POA: Diagnosis not present

## 2020-10-01 DIAGNOSIS — F330 Major depressive disorder, recurrent, mild: Secondary | ICD-10-CM | POA: Diagnosis not present

## 2020-10-01 DIAGNOSIS — R482 Apraxia: Secondary | ICD-10-CM | POA: Diagnosis not present

## 2020-10-03 ENCOUNTER — Ambulatory Visit: Payer: BC Managed Care – PPO | Admitting: Clinical

## 2020-10-04 ENCOUNTER — Other Ambulatory Visit: Payer: Self-pay

## 2020-10-04 DIAGNOSIS — R55 Syncope and collapse: Secondary | ICD-10-CM

## 2020-10-04 DIAGNOSIS — I951 Orthostatic hypotension: Secondary | ICD-10-CM

## 2020-10-04 DIAGNOSIS — F4312 Post-traumatic stress disorder, chronic: Secondary | ICD-10-CM | POA: Diagnosis not present

## 2020-10-05 NOTE — Progress Notes (Signed)
NEUROLOGY FOLLOW UP OFFICE NOTE  James Matthews [id number]  Assessment/Plan:   1.  Migraine without aura, without status migrainosus, not intractable 2.  Syncope/orthostatic hypotension - dysautonomia - I think he would definitely need to see a neurologic autonomic specialist.  I am not sure that [facility] still has a specialist.  He may need to go to [facility].  Advised to discuss with his cardiologist.  1.  Aimovig 140mg  every 28 days 2.  Rizatriptan 10mg  PRN 3.  Limit use of pain relievers to no more than 2 days out of week to prevent risk of rebound or medication-overuse headache. 4.  Keep headache diary 5.  Follow up 6 months.  Subjective:  C. Hoos is a 25year old male with HTN and asthma who follows up for migraines.James GashHe is accompanied by his mother who supplements history.  UPDATE: Migraines are stable. Intensity:Moderate to severe Duration:1 hour with rizatriptan Frequency:6 in past 30 days  Patient has history of syncope and orthostatic hypotension.  There is associated confusion and blurred vision.  Also reports profuse sweating.  Evaluated by cardiology.  Echo and 14 day event monitor unremarkable.  Referred to [facility] autonomic clinic.   Current NSAIDS:none Current analgesics:Tylenol (he was previously taking 3 to 4 days a week) Current triptans:rizatriptan 10mg  (regular tablet as insurance would not cover MLT) Current ergotamine:none Current anti-emetic:Zofran ODT 8mg  Current muscle relaxants:none Current anti-anxiolytic:Clonazepam; busipirone Current sleep aide:none Current Antihypertensive medications:losartan Current Antidepressant medications:Pristiq Current Anticonvulsant medications:none Current anti-CGRP:Aimovig 140mg  Current Vitamins/Herbal/Supplements:none Current Antihistamines/Decongestants:Astelin Other therapy:none Hormone/birth control:none  Caffeine:1 cup coffee daily. Diet:2  liters water daily. No soda. Exercise:Once a week Depression:Yes but stable; Anxiety:Yes but stable Other pain:no Sleep hygiene:8 hours of sleep a night. Previously poor  HISTORY: He has history of migraines since childhood but have gotten worse about 2 months ago. He has no explanation for increased frequency. They are severe right worse than left parietal pressure-like headaches. They are associated with nausea, visual disturbance (red dots) photophobia and phonophobia. They last from several hours to several days. They have been occurring daily (previously occurring 3 to 4 times a week). Triggers include light and sound. Due to his speech disorder, prolonged talking may be a trigger. Laying down to rest in dark and quiet room helps.  Prednisone taper was ineffectiveafter it was finished.  MRI of brain with and without contrast on 08/06/2019 was normal.  Remote CT head without contrast from 02/19/2007 to evaluate headache was personally reviewed and demonstrated incidental findings consistent with right ethmoid sinusitis but no acute intracranial abnormality.  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 No known cause but possibly genetic. He was born 3 months premature.  Past NSAIDS/steroids:Prednisone. Unable to take NSAIDs due to renal disorder. Past analgesics:none Past abortive triptans:sumatriptan (ineffective) Past abortive ergotamine:none Past muscle relaxants:none Past anti-emetic:Zofran ODT 4mg  Past antihypertensive medications:lisinopril Past antidepressant/antipsychotic/moodmedications: Fluoxetine, mirtazepine, Seroquel, trazodone Past anticonvulsant medications:Gabapentin 600mg  Past anti-CGRP:nonw Past vitamins/Herbal/Supplements:none Past antihistamines/decongestants:none Other past therapies:none   Family history of headache:Mom (migraines); brother (migraines); sister (migraines)  PAST MEDICAL HISTORY: Past  Medical History:  Diagnosis Date  . Asthma   . Hypertension   . IBS (irritable bowel syndrome)    probiotic  . Orthostatic hypertension   . Posterior urethral valves   . Premature birth    3 months early  . Renal disorder   . Verbal apraxia     MEDICATIONS: Current Outpatient Medications on File Prior to Visit  Medication Sig Dispense Refill  . AIRDUO  DIGIHALER 232-14 MCG/ACT AEPB Inhale 1 puff into the lungs in the morning and at bedtime. 1 each 5  . ALPRAZolam (XANAX) 1 MG tablet Take 1 mg by mouth 2 (two) times daily as needed for anxiety.     Marland Kitchen ALVESCO 80 MCG/ACT inhaler Inhale 2 puffs into the lungs 2 (two) times daily. 2 each 5  . busPIRone (BUSPAR) 30 MG tablet TAKE 1 TABLET BY MOUTH TWICE A DAY 180 tablet 1  . Desvenlafaxine Succinate (PRISTIQ PO) Take 150 mg by mouth. Take 1 tablet in the morning    . EPINEPHRINE 0.3 mg/0.3 mL IJ SOAJ injection INJECT 0.3 MLS INTO THE MUSCLE ONCE FOR 1 DOSE. AS DIRECTED FOR LIFE-THREATENING ALLERGIC REACTIONS 2 each 1  . Erenumab-aooe (AIMOVIG) 140 MG/ML SOAJ Inject 140 mg into the skin every 30 (thirty) days. 1 pen 11  . eszopiclone (LUNESTA) 2 MG TABS tablet Take 2 mg by mouth at bedtime.    . levalbuterol (XOPENEX HFA) 45 MCG/ACT inhaler TAKE 2 PUFFS BY MOUTH EVERY 4 HOURS AS NEEDED FOR WHEEZE 15 each 12  . levalbuterol (XOPENEX) 1.25 MG/3ML nebulizer solution 1 vial via nebulizer every 4-6 hours as needed. 75 mL 1  . losartan (COZAAR) 100 MG tablet Take 1 tablet by mouth every evening.     . ondansetron (ZOFRAN-ODT) 8 MG disintegrating tablet TAKE 1 TABLET (8 MG TOTAL) BY MOUTH EVERY 8 (EIGHT) HOURS AS NEEDED FOR NAUSEA OR VOMITING. 20 tablet 3  . prazosin (MINIPRESS) 2 MG capsule Take 4 mg by mouth at bedtime.     . rizatriptan (MAXALT) 10 MG tablet Take 1 tablet earliest onset of migraine.  May repeat in 2 hours if needed.  Maximum 2 tablets in 24 hours 10 tablet 5   Current Facility-Administered Medications on File Prior to Visit   Medication Dose Route Frequency Provider Last Rate Last Admin  . Mepolizumab SOLR 100 mg  100 mg Subcutaneous Q28 days Marcelyn Bruins, MD   100 mg at 01/12/20 1725    ALLERGIES: Allergies  Allergen Reactions  . Albuterol     High HR  . Chocolate     Face swelling/sounds like angioedema "I had to go to urgent care today due to an allergic reaction to a chocolate candy bar. My throat swelled up and was closing, my nasal passages were congested, and my face was hurting. They gave me a steroid shot at the clinic, and then they sent me home presidone to take for a week" 06/2019  . Lisinopril Other (See Comments)  . Other Other (See Comments)    Pain med? Unsure of medication, caused n/v    FAMILY HISTORY: Family History  Problem Relation Age of Onset  . Breast cancer Mother        survivor  . Hypertension Mother   . Hyperlipidemia Mother   . Other Father        pituitary tumor- gamma knife radiation  . Heart disease Maternal Grandfather   . Prostate cancer Maternal Grandfather        91s  . Melanoma Sister   . Prostate cancer Paternal Grandfather        14s  . Ovarian cancer Maternal Aunt        mets to liver       Objective:  Blood pressure 109/73, pulse (!) 110, resp. rate 18, height 6\' 1"  (1.854 m), SpO2 99 %. General: No acute distress.  Patient appears well-groomed.        Tomi Likens, DO

## 2020-10-05 NOTE — Progress Notes (Signed)
Referral placed for [facility] per Dr. Izora Ribas request.  Put in comments that patient needs to be seen in the autonomic dysfunction clinic.

## 2020-10-06 ENCOUNTER — Encounter: Payer: Self-pay | Admitting: Neurology

## 2020-10-06 ENCOUNTER — Other Ambulatory Visit: Payer: Self-pay

## 2020-10-06 ENCOUNTER — Ambulatory Visit: Payer: BC Managed Care – PPO | Admitting: Neurology

## 2020-10-06 VITALS — BP 109/73 | HR 110 | Resp 18 | Ht 73.0 in

## 2020-10-06 DIAGNOSIS — I951 Orthostatic hypotension: Secondary | ICD-10-CM

## 2020-10-06 DIAGNOSIS — G43009 Migraine without aura, not intractable, without status migrainosus: Secondary | ICD-10-CM | POA: Diagnosis not present

## 2020-10-06 NOTE — Patient Instructions (Signed)
Continue Aimovig 140mg  every 28 days and rizatriptan as needed Follow up with cardiology regarding referral to an dysautonomia specialist

## 2020-10-10 ENCOUNTER — Ambulatory Visit: Payer: BC Managed Care – PPO | Admitting: Clinical

## 2020-10-11 ENCOUNTER — Other Ambulatory Visit: Payer: Self-pay

## 2020-10-11 DIAGNOSIS — F4312 Post-traumatic stress disorder, chronic: Secondary | ICD-10-CM | POA: Diagnosis not present

## 2020-10-12 NOTE — Progress Notes (Signed)
Phone: [phone]    Subjective:  Patient presents today for their annual physical. Chief complaint-noted.   See problem oriented charting- ROS- full  review of systems was completed and negative  except for: activity changes, sweating, fatigue, light sensitivity, shortness of breath, wheezing, palpitations, constipation, nausea, heat intolerance, increased thirst, difficulty urinating, back pain, muscle aches, neck pain, neck stiffness, pale skin, immunocompromised, dizziness, headaches, light headedness, decreased concentration, heavy sweating- we have discussed these previously and related to diseases  The following were reviewed and entered/updated in epic: Past Medical History:  Diagnosis Date  . Asthma   . Hypertension   . IBS (irritable bowel syndrome)    probiotic  . Orthostatic hypertension   . Posterior urethral valves   . Premature birth    3 months early  . Renal disorder   . Verbal apraxia    Patient Active Problem List   Diagnosis Date Noted  . Daily headache 06/02/2019    Priority: High  . Asthma, severe persistent 05/25/2019    Priority: High  . Major depression, recurrent (HCC) 06/04/2016    Priority: High  . Verbal apraxia     Priority: High  . PTSD (post-traumatic stress disorder) 10/19/2019    Priority: Medium  . Posterior urethral valves (PUV) 06/04/2016    Priority: Medium  . Essential hypertension 06/04/2016    Priority: Medium  . Bladder spasms 06/04/2016    Priority: Medium  . GAD (generalized anxiety disorder) 06/04/2016    Priority: Medium  . CKD (chronic kidney disease), stage II 06/04/2016    Priority: Medium  . Mesenteric lymphadenopathy 07/27/2016    Priority: Low  . GERD (gastroesophageal reflux disease) 06/04/2016    Priority: Low  . Premature birth     Priority: Low  . IBS (irritable bowel syndrome)     Priority: Low  . Orthostatic hypotension 09/02/2020  . Syncope 05/27/2020  . Palpitations 05/27/2020  . Social anxiety  disorder 05/04/2018  . Insomnia 05/04/2018   Past Surgical History:  Procedure Laterality Date  . ADENOIDECTOMY    . ESOPHAGEAL MANOMETRY N/A 01/23/2016   Procedure: ESOPHAGEAL MANOMETRY (EM);  Surgeon: Charna Elizabeth, MD;  Location: [facility];  Service: Endoscopy;  Laterality: N/A;  . LASIK     eye surgery july 2017  . NEPHRECTOMY     rght   . TONSILLECTOMY      Family History  Problem Relation Age of Onset  . Breast cancer Mother        survivor  . Hypertension Mother   . Hyperlipidemia Mother   . Other Father        pituitary tumor- gamma knife radiation  . Heart disease Maternal Grandfather   . Prostate cancer Maternal Grandfather        21s  . Melanoma Sister   . Prostate cancer Paternal Grandfather        14s  . Ovarian cancer Maternal Aunt        mets to liver     Medications- reviewed and updated Current Outpatient Medications  Medication Sig Dispense Refill  . AIRDUO DIGIHALER 232-14 MCG/ACT AEPB Inhale 1 puff into the lungs in the morning and at bedtime. 1 each 5  . ALPRAZolam (XANAX) 1 MG tablet Take 1 mg by mouth 2 (two) times daily as needed for anxiety.     Marland Kitchen aluminum chloride (DRYSOL) 20 % external solution Apply topically at bedtime. once excessive sweating has stopped, may decrease to once or twice weekly, or as needed. Wash  treated area in the morning. 60 mL 5  . ALVESCO 80 MCG/ACT inhaler Inhale 2 puffs into the lungs 2 (two) times daily. 2 each 5  . busPIRone (BUSPAR) 30 MG tablet TAKE 1 TABLET BY MOUTH TWICE A DAY 180 tablet 1  . Desvenlafaxine Succinate (PRISTIQ PO) Take 150 mg by mouth. Take 1 tablet in the morning    . EPINEPHRINE 0.3 mg/0.3 mL IJ SOAJ injection INJECT 0.3 MLS INTO THE MUSCLE ONCE FOR 1 DOSE. AS DIRECTED FOR LIFE-THREATENING ALLERGIC REACTIONS 2 each 1  . Erenumab-aooe (AIMOVIG) 140 MG/ML SOAJ Inject 140 mg into the skin every 30 (thirty) days. 1 pen 11  . ketoconazole (NIZORAL) 2 % cream Apply 1 application topically daily. For  athlete's foot 60 g 1  . levalbuterol (XOPENEX HFA) 45 MCG/ACT inhaler TAKE 2 PUFFS BY MOUTH EVERY 4 HOURS AS NEEDED FOR WHEEZE 15 each 12  . levalbuterol (XOPENEX) 1.25 MG/3ML nebulizer solution 1 vial via nebulizer every 4-6 hours as needed. 75 mL 1  . losartan (COZAAR) 50 MG tablet Take 50 mg by mouth daily.    . ondansetron (ZOFRAN-ODT) 8 MG disintegrating tablet TAKE 1 TABLET (8 MG TOTAL) BY MOUTH EVERY 8 (EIGHT) HOURS AS NEEDED FOR NAUSEA OR VOMITING. 20 tablet 3  . rizatriptan (MAXALT) 10 MG tablet Take 1 tablet earliest onset of migraine.  May repeat in 2 hours if needed.  Maximum 2 tablets in 24 hours 10 tablet 5   Current Facility-Administered Medications  Medication Dose Route Frequency Provider Last Rate Last Admin  . Mepolizumab SOLR 100 mg  100 mg Subcutaneous Q28 days Marcelyn Bruins, MD   100 mg at 01/12/20 1725    Allergies-reviewed and updated Allergies  Allergen Reactions  . Albuterol     High HR  . Chocolate     Face swelling/sounds like angioedema "I had to go to urgent care today due to an allergic reaction to a chocolate candy bar. My throat swelled up and was closing, my nasal passages were congested, and my face was hurting. They gave me a steroid shot at the clinic, and then they sent me home presidone to take for a week" 06/2019  . Lisinopril Other (See Comments)  . Other Other (See Comments)    Pain med? Unsure of medication, caused n/v    Social History   Social History Narrative   Single. Lives with mom and dad. His brother and wife and nephew also live with him. Brother and sister- healthy.       Full time at [employer]. Studying psychology with biology minor.    Thinking grad school for psychology- would plan for research over clinical   Right handed   Two story home   Does drink caffeine, one large coffee daily      Objective:  BP 120/68   Pulse (!) 108   Temp (!) 97.3 F (36.3 C) (Temporal)   Ht 6\' 1"  (1.854 m)   Wt 172 lb  (78 kg)   SpO2 96%   BMI 22.69 kg/m  Gen: NAD, resting comfortably HEENT: Mucous membranes are moist. Oropharynx normal Neck: no thyromegaly CV: High normal heart rate but regular no murmurs rubs or gallops Lungs: CTAB no crackles, wheeze, rhonchi Abdomen: soft/nontender/nondistended/normal bowel sounds. No rebound or guarding.  Ext: no edema Skin: warm, dry on majority of exam-see separate note for rash on foot Neuro: grossly normal, moves all extremities, PERRLA     Assessment and Plan:  25 y.o. male  presenting for annual physical.  Health Maintenance counseling: 1. Anticipatory guidance: Patient counseled regarding regular dental exams -q6 months, eye exams - yearly juts had,  avoiding smoking and second hand smoke , limiting alcohol to 2 beverages per day- doesn't drink, no drugs   2. Risk factor reduction:  Advised patient of need for regular exercise and diet rich and fruits and vegetables to reduce risk of heart attack and stroke. Exercise- limited by dysautonomia/orthostatic symptoms- trying to do twice a week walking- asthma also bothers him. Diet-reasonably healthy weight.  Wt Readings from Last 3 Encounters:  10/13/20 172 lb (78 kg)  09/16/20 165 lb (74.8 kg)  09/02/20 165 lb (74.8 kg)  3. Immunizations/screenings/ancillary studies- had HPV vaccine as teenager he believes Immunization History  Administered Date(s) Administered  . Influenza, High Dose Seasonal PF 05/22/2018, 03/12/2019  . Influenza, Seasonal, Injecte, Preservative Fre 05/22/2018, 03/12/2019  . Influenza,inj,Quad PF,6+ Mos 05/22/2018, 03/12/2019, 02/27/2020  . Influenza-Unspecified 03/08/2016  . PFIZER Comirnaty(Gray Top)Covid-19 Tri-Sucrose Vaccine 07/29/2020  . PFIZER(Purple Top)SARS-COV-2 Vaccination 02/06/2020, 02/27/2020  . Tdap 05/22/2018  4. Prostate cancer screening- grandfather on both sides in 75s- consider starting age 39 or so.  5. Colon cancer screening -no family history, start at age  34  6. Skin cancer screening/prevention- saw dermatology within last 18 months. advised regular sunscreen use. Denies worrisome, changing, or new skin lesions.  7. Testicular cancer screening- advised monthly self exams  8. STD screening- patient opts  Out- not dating and would use protection if active 9. Never smoker-   Status of chronic or acute concerns   #Dysautonomia/orthostatic hypotension -Patient followed locally by Dr. Salena Saner of cardiology -Patient is pending evaluation with [facility] in [facility] Dr. Everlena Cooper had recommended this after [facility] specialist was not available - symptoms slightly better- stopping lunesta and minipress due to possible correlatoins with symptoms -wearing compression stockings and doing pedialyte -Patient would like to cancel appointment with cardiology at [facility] will call to do so  #hypertension/CKD stage II S: medication: losartan 50 mg BP Readings from Last 3 Encounters:  10/13/20 120/68  10/06/20 109/73  09/21/20 127/81  A/P: With prior orthostatic symptoms patient ran current dose of losartan by Dr. Jean Rosenthal who recommended maintaining current dose-we opted to continue current medicine today -CKD stage II has been stable  #Asthma-managed by allergist  #Migraines-managed by neurology  #PTSD/GAD-patient is being managed by psychiatry on alprazolam, buspirone, Pristiq Recommended follow up: Return in about 6 months (around 04/14/2021) for follow up- or sooner if needed. Future Appointments  Date Time Provider Department Center  12/01/2020  2:30 PM Marcelyn Bruins, MD [facility] None  12/21/2020  9:40 AM Christell Constant, MD [facility]  04/10/2021  2:30 PM Drema Dallas, DO [facility] None  04/14/2021  3:00 PM Shelva Majestic, MD [facility]    Lab/Order associations: Has had labs recently-lipids largely reassuring within a few years-we opted not to repeat at this time.   ICD-10-CM   1.  Encounter for general adult medical examination with abnormal findings  Z00.01   2. Hyperhidrosis  R61   3. Tinea pedis of right foot  B35.3   4. Essential hypertension  I10   5. Severe persistent asthma without complication  J45.50   6. Gastroesophageal reflux disease without esophagitis  K21.9   7. CKD (chronic kidney disease), stage II  N18.2   8. Recurrent major depressive disorder, in full remission (HCC)  F33.42   9. GAD (generalized anxiety disorder)  F41.1  Meds ordered this encounter  Medications  . aluminum chloride (DRYSOL) 20 % external solution    Sig: Apply topically at bedtime. once excessive sweating has stopped, may decrease to once or twice weekly, or as needed. Wash treated area in the morning.    Dispense:  60 mL    Refill:  5  . ketoconazole (NIZORAL) 2 % cream    Sig: Apply 1 application topically daily. For athlete's foot    Dispense:  60 g    Refill:  1    Return precautions advised.   Tana ConchStephen Candiss Galeana, MD

## 2020-10-12 NOTE — Patient Instructions (Addendum)
No changes today  Keep me updated on your [place] journey - glad things are doing a little better already for you and hope continues to improve  Use ketoconazole for athlete's foot  Can try Drysol for the excess sweating in the armpits  Recommended follow up: Return in about 6 months (around 04/14/2021) for follow up- or sooner if needed.

## 2020-10-13 ENCOUNTER — Ambulatory Visit: Payer: BC Managed Care – PPO | Admitting: Clinical

## 2020-10-13 ENCOUNTER — Encounter: Payer: Self-pay | Admitting: Family Medicine

## 2020-10-13 ENCOUNTER — Other Ambulatory Visit: Payer: Self-pay

## 2020-10-13 ENCOUNTER — Ambulatory Visit (INDEPENDENT_AMBULATORY_CARE_PROVIDER_SITE_OTHER): Payer: BC Managed Care – PPO | Admitting: Family Medicine

## 2020-10-13 VITALS — BP 120/68 | HR 108 | Temp 97.3°F | Ht 73.0 in | Wt 172.0 lb

## 2020-10-13 DIAGNOSIS — R61 Generalized hyperhidrosis: Secondary | ICD-10-CM | POA: Diagnosis not present

## 2020-10-13 DIAGNOSIS — F411 Generalized anxiety disorder: Secondary | ICD-10-CM

## 2020-10-13 DIAGNOSIS — F4010 Social phobia, unspecified: Secondary | ICD-10-CM

## 2020-10-13 DIAGNOSIS — B353 Tinea pedis: Secondary | ICD-10-CM | POA: Diagnosis not present

## 2020-10-13 DIAGNOSIS — K219 Gastro-esophageal reflux disease without esophagitis: Secondary | ICD-10-CM

## 2020-10-13 DIAGNOSIS — Z0001 Encounter for general adult medical examination with abnormal findings: Secondary | ICD-10-CM

## 2020-10-13 DIAGNOSIS — F3342 Major depressive disorder, recurrent, in full remission: Secondary | ICD-10-CM

## 2020-10-13 DIAGNOSIS — I10 Essential (primary) hypertension: Secondary | ICD-10-CM | POA: Diagnosis not present

## 2020-10-13 DIAGNOSIS — Z0000 Encounter for general adult medical examination without abnormal findings: Secondary | ICD-10-CM

## 2020-10-13 DIAGNOSIS — N182 Chronic kidney disease, stage 2 (mild): Secondary | ICD-10-CM

## 2020-10-13 DIAGNOSIS — J4550 Severe persistent asthma, uncomplicated: Secondary | ICD-10-CM | POA: Diagnosis not present

## 2020-10-13 MED ORDER — KETOCONAZOLE 2 % EX CREA: 1.0000 "application " | TOPICAL_CREAM | Freq: Every day | CUTANEOUS | 1 refills | Status: DC

## 2020-10-13 MED ORDER — ALUMINUM CHLORIDE 20 % EX SOLN: Freq: Every day | CUTANEOUS | 5 refills | Status: DC

## 2020-10-13 NOTE — Progress Notes (Signed)
Phone [phone] In person visit   Subjective:   James Matthews is a 25 y.o. year old very pleasant male patient who presents for/with See problem oriented charting This visit occurred during the SARS-CoV-2 public health emergency.  Safety protocols were in place, including screening questions prior to the visit, additional usage of staff PPE, and extensive cleaning of exam room while observing appropriate contact time as indicated for disinfecting solutions.   Past Medical History-  Patient Active Problem List   Diagnosis Date Noted  . Daily headache 06/02/2019    Priority: High  . Asthma, severe persistent 05/25/2019    Priority: High  . Major depression, recurrent (HCC) 06/04/2016    Priority: High  . Verbal apraxia     Priority: High  . PTSD (post-traumatic stress disorder) 10/19/2019    Priority: Medium  . Posterior urethral valves (PUV) 06/04/2016    Priority: Medium  . Essential hypertension 06/04/2016    Priority: Medium  . Bladder spasms 06/04/2016    Priority: Medium  . GAD (generalized anxiety disorder) 06/04/2016    Priority: Medium  . CKD (chronic kidney disease), stage II 06/04/2016    Priority: Medium  . Mesenteric lymphadenopathy 07/27/2016    Priority: Low  . GERD (gastroesophageal reflux disease) 06/04/2016    Priority: Low  . Premature birth     Priority: Low  . IBS (irritable bowel syndrome)     Priority: Low  . Orthostatic hypotension 09/02/2020  . Syncope 05/27/2020  . Palpitations 05/27/2020  . Social anxiety disorder 05/04/2018  . Insomnia 05/04/2018    Medications- reviewed and updated Current Outpatient Medications  Medication Sig Dispense Refill  . AIRDUO DIGIHALER 232-14 MCG/ACT AEPB Inhale 1 puff into the lungs in the morning and at bedtime. 1 each 5  . ALPRAZolam (XANAX) 1 MG tablet Take 1 mg by mouth 2 (two) times daily as needed for anxiety.     Marland Kitchen aluminum chloride (DRYSOL) 20 % external solution Apply topically at  bedtime. once excessive sweating has stopped, may decrease to once or twice weekly, or as needed. Wash treated area in the morning. 60 mL 5  . ALVESCO 80 MCG/ACT inhaler Inhale 2 puffs into the lungs 2 (two) times daily. 2 each 5  . busPIRone (BUSPAR) 30 MG tablet TAKE 1 TABLET BY MOUTH TWICE A DAY 180 tablet 1  . Desvenlafaxine Succinate (PRISTIQ PO) Take 150 mg by mouth. Take 1 tablet in the morning    . EPINEPHRINE 0.3 mg/0.3 mL IJ SOAJ injection INJECT 0.3 MLS INTO THE MUSCLE ONCE FOR 1 DOSE. AS DIRECTED FOR LIFE-THREATENING ALLERGIC REACTIONS 2 each 1  . Erenumab-aooe (AIMOVIG) 140 MG/ML SOAJ Inject 140 mg into the skin every 30 (thirty) days. 1 pen 11  . ketoconazole (NIZORAL) 2 % cream Apply 1 application topically daily. For athlete's foot 60 g 1  . levalbuterol (XOPENEX HFA) 45 MCG/ACT inhaler TAKE 2 PUFFS BY MOUTH EVERY 4 HOURS AS NEEDED FOR WHEEZE 15 each 12  . levalbuterol (XOPENEX) 1.25 MG/3ML nebulizer solution 1 vial via nebulizer every 4-6 hours as needed. 75 mL 1  . losartan (COZAAR) 50 MG tablet Take 50 mg by mouth daily.    . ondansetron (ZOFRAN-ODT) 8 MG disintegrating tablet TAKE 1 TABLET (8 MG TOTAL) BY MOUTH EVERY 8 (EIGHT) HOURS AS NEEDED FOR NAUSEA OR VOMITING. 20 tablet 3  . rizatriptan (MAXALT) 10 MG tablet Take 1 tablet earliest onset of migraine.  May repeat in 2 hours if needed.  Maximum 2  tablets in 24 hours 10 tablet 5   Current Facility-Administered Medications  Medication Dose Route Frequency Provider Last Rate Last Admin  . Mepolizumab SOLR 100 mg  100 mg Subcutaneous Q28 days Marcelyn Bruins, MD   100 mg at 01/12/20 1725     Objective:  BP 120/68   Pulse (!) 108   Temp (!) 97.3 F (36.3 C) (Temporal)   Ht 6\' 1"  (1.854 m)   Wt 172 lb (78 kg)   SpO2 96%   BMI 22.69 kg/m  Gen: NAD, resting comfortably  Skin: warm, dry, patient with fine erythema and some scaling/dry skin in almost moccasin type pattern around the right foot     Assessment  and Plan   #Hyperhidrosis S: Patient reports about a years worth of symptoms-excessive sweating where he has to change shirts twice a day even if not really being active.  Sweating is most intense in the armpits but also can occur in his legs-noticed the leg issues in his sheets which can get wet and he has to change them at times. A/P: Patient with hyperhidrosis-I wonder dysautonomia is related.  We opted to try a course of Drysol-this was sent to his local pharmacy -TSH level has been normal Lab Results  Component Value Date   TSH 2.01 08/25/2020   #Rash on foot S: Patient with rash on foot with slight redness and some dry skin/scaling worse on the right foot-occurring for several months A/P: This appears to be tinea pedis moccasin pattern-we will place patient on ketoconazole-request prescription over over-the-counter treatment-if fails to improve we can reevaluate and refer to dermatology   Recommended follow up: Return in about 6 months (around 04/14/2021) for follow up- or sooner if needed. Future Appointments  Date Time Provider Department Center  12/01/2020  2:30 PM 12/03/2020, MD [facility] None  12/21/2020  9:40 AM 12/23/2020, MD [facility]  04/10/2021  2:30 PM 06/10/2021, DO [facility] None  04/14/2021  3:00 PM 06/14/2021, MD [facility]    Lab/Order associations:   ICD-10-CM   1. Hyperhidrosis  R61   2. Tinea pedis of right foot  B35.3    Meds ordered this encounter  Medications  . aluminum chloride (DRYSOL) 20 % external solution    Sig: Apply topically at bedtime. once excessive sweating has stopped, may decrease to once or twice weekly, or as needed. Wash treated area in the morning.    Dispense:  60 mL    Refill:  5  . ketoconazole (NIZORAL) 2 % cream    Sig: Apply 1 application topically daily. For athlete's foot    Dispense:  60 g    Refill:  1   Return precautions advised.  Shelva Majestic, MD

## 2020-10-14 ENCOUNTER — Other Ambulatory Visit: Payer: Self-pay

## 2020-10-14 DIAGNOSIS — F4312 Post-traumatic stress disorder, chronic: Secondary | ICD-10-CM | POA: Diagnosis not present

## 2020-10-14 DIAGNOSIS — I951 Orthostatic hypotension: Secondary | ICD-10-CM

## 2020-10-14 DIAGNOSIS — R55 Syncope and collapse: Secondary | ICD-10-CM

## 2020-10-18 DIAGNOSIS — F4312 Post-traumatic stress disorder, chronic: Secondary | ICD-10-CM | POA: Diagnosis not present

## 2020-10-20 ENCOUNTER — Encounter: Payer: Self-pay | Admitting: Neurology

## 2020-10-20 NOTE — Progress Notes (Signed)
James Matthews (Key: [id number]) Aimovig 140MG /ML auto-injectors   Form Blue Form (CB) Created 2 days ago Sent to Plan 2 days ago Plan Response 2 days ago Submit Clinical Questions 2 days ago Determination Favorable 3 hours ago Message from Plan Effective from 10/18/2020 through 10/17/2021.

## 2020-10-20 NOTE — Telephone Encounter (Signed)
Monitor was never returned and marked "lost" in the [employer] system. Order will be canceled.  

## 2020-10-25 DIAGNOSIS — F4312 Post-traumatic stress disorder, chronic: Secondary | ICD-10-CM | POA: Diagnosis not present

## 2020-10-26 ENCOUNTER — Other Ambulatory Visit: Payer: Self-pay | Admitting: Neurology

## 2020-10-31 ENCOUNTER — Other Ambulatory Visit: Payer: Self-pay | Admitting: Neurology

## 2020-10-31 ENCOUNTER — Encounter: Payer: Self-pay | Admitting: Family Medicine

## 2020-11-01 DIAGNOSIS — F4312 Post-traumatic stress disorder, chronic: Secondary | ICD-10-CM | POA: Diagnosis not present

## 2020-11-01 NOTE — Telephone Encounter (Signed)
I have messaged pt in regards to scheduling (so you can look past that). See below regading the good news and CT scans.

## 2020-11-02 ENCOUNTER — Ambulatory Visit (INDEPENDENT_AMBULATORY_CARE_PROVIDER_SITE_OTHER): Payer: BC Managed Care – PPO | Admitting: Family Medicine

## 2020-11-02 ENCOUNTER — Encounter: Payer: Self-pay | Admitting: Family Medicine

## 2020-11-02 ENCOUNTER — Other Ambulatory Visit: Payer: Self-pay

## 2020-11-02 VITALS — BP 110/64 | HR 100 | Temp 97.6°F | Wt 171.2 lb

## 2020-11-02 DIAGNOSIS — B370 Candidal stomatitis: Secondary | ICD-10-CM

## 2020-11-02 MED ORDER — NYSTATIN 100000 UNIT/ML MT SUSP: OROMUCOSAL | 0 refills | Status: DC

## 2020-11-02 NOTE — Progress Notes (Signed)
Subjective  CC:  Chief Complaint  Patient presents with  . tongue    White coating on tongue, noticed about a week ago. Denies recently being on any antibiotics    Same day acute visit; PCP not available. New pt to me. Chart reviewed.   HPI: James Matthews is a 25 y.o. male who presents to the office today to address the problems listed above in the chief complaint.  25 year old male with complicated past medical history who presents for coating on his tongue for the last week or 2.  He denies pain or soreness.  He admits that his mouth is been more dry over the last 2 months.  He was recently started on steroid containing inhaler.  He denies sore throat, fevers, chills, postnasal drainage, dental pain or malaise.  He denies other new medications.  He was recently diagnosed with this dysautonomia and will be seeing specialist at the [facility].   Assessment  1. Oral thrush      Plan   Oral thrush, related to recent steroids inhaler use: Education given.  Recommend rinsing mouth out after using inhaler on a regular basis.  Treat with nystatin oral suspension, swish and spit 4 times daily for 7 days.  Follow-up if not improving  Follow up: As scheduled 04/14/2021  No orders of the defined types were placed in this encounter.  Meds ordered this encounter  Medications  . nystatin (MYCOSTATIN) 100000 UNIT/ML suspension    Sig: Take 73ml-10ml by mouth, swish and spit, 4 times daily for a week.    Dispense:  60 mL    Refill:  0      I reviewed the patients updated PMH, FH, and SocHx.    Patient Active Problem List   Diagnosis Date Noted  . Orthostatic hypotension 09/02/2020  . Syncope 05/27/2020  . Palpitations 05/27/2020  . PTSD (post-traumatic stress disorder) 10/19/2019  . Daily headache 06/02/2019  . Asthma, severe persistent 05/25/2019  . Social anxiety disorder 05/04/2018  . Insomnia 05/04/2018  . Mesenteric lymphadenopathy 07/27/2016  . Posterior urethral  valves (PUV) 06/04/2016  . GERD (gastroesophageal reflux disease) 06/04/2016  . Essential hypertension 06/04/2016  . Bladder spasms 06/04/2016  . GAD (generalized anxiety disorder) 06/04/2016  . CKD (chronic kidney disease), stage II 06/04/2016  . Major depression, recurrent (HCC) 06/04/2016  . Premature birth   . IBS (irritable bowel syndrome)   . Verbal apraxia    Current Meds  Medication Sig  . AIMOVIG 140 MG/ML SOAJ INJECT INTO THE SKIN EVERY 30 DAYS  . AIRDUO DIGIHALER 232-14 MCG/ACT AEPB Inhale 1 puff into the lungs in the morning and at bedtime.  . ALPRAZolam (XANAX) 1 MG tablet Take 1 mg by mouth 2 (two) times daily as needed for anxiety.   Marland Kitchen aluminum chloride (DRYSOL) 20 % external solution Apply topically at bedtime. once excessive sweating has stopped, may decrease to once or twice weekly, or as needed. Wash treated area in the morning.  Marland Kitchen ALVESCO 80 MCG/ACT inhaler Inhale 2 puffs into the lungs 2 (two) times daily.  . busPIRone (BUSPAR) 30 MG tablet TAKE 1 TABLET BY MOUTH TWICE A DAY  . Desvenlafaxine Succinate (PRISTIQ PO) Take 150 mg by mouth. Take 1 tablet in the morning  . EPINEPHRINE 0.3 mg/0.3 mL IJ SOAJ injection INJECT 0.3 MLS INTO THE MUSCLE ONCE FOR 1 DOSE. AS DIRECTED FOR LIFE-THREATENING ALLERGIC REACTIONS  . ketoconazole (NIZORAL) 2 % cream Apply 1 application topically daily. For athlete's foot  .  levalbuterol (XOPENEX HFA) 45 MCG/ACT inhaler TAKE 2 PUFFS BY MOUTH EVERY 4 HOURS AS NEEDED FOR WHEEZE  . levalbuterol (XOPENEX) 1.25 MG/3ML nebulizer solution 1 vial via nebulizer every 4-6 hours as needed.  Marland Kitchen losartan (COZAAR) 50 MG tablet Take 50 mg by mouth daily.  Marland Kitchen nystatin (MYCOSTATIN) 100000 UNIT/ML suspension Take 29ml-10ml by mouth, swish and spit, 4 times daily for a week.  . ondansetron (ZOFRAN-ODT) 8 MG disintegrating tablet TAKE 1 TABLET (8 MG TOTAL) BY MOUTH EVERY 8 (EIGHT) HOURS AS NEEDED FOR NAUSEA OR VOMITING.  . rizatriptan (MAXALT) 10 MG tablet Take 1  tablet earliest onset of migraine.  May repeat in 2 hours if needed.  Maximum 2 tablets in 24 hours   Current Facility-Administered Medications for the 11/02/20 encounter (Office Visit) with Willow Ora, MD  Medication  . Mepolizumab SOLR 100 mg    Allergies: Patient is allergic to albuterol, chocolate, lisinopril, and other. Family History: Patient family history includes Breast cancer in his mother; Heart disease in his maternal grandfather; Hyperlipidemia in his mother; Hypertension in his mother; Melanoma in his sister; Other in his father; Ovarian cancer in his maternal aunt; Prostate cancer in his maternal grandfather and paternal grandfather. Social History:  Patient  reports that he has never smoked. He has never used smokeless tobacco. He reports that he does not drink alcohol and does not use drugs.  Review of Systems: Constitutional: Negative for fever malaise or anorexia Cardiovascular: negative for chest pain Respiratory: negative for SOB or persistent cough Gastrointestinal: negative for abdominal pain  Objective  Vitals: BP 110/64   Pulse 100   Temp 97.6 F (36.4 C) (Temporal)   Wt 171 lb 3.2 oz (77.7 kg)   SpO2 99%   BMI 22.59 kg/m  General: no acute distress , A&Ox3, appears well HEENT: PEERL, conjunctiva normal, neck is supple, no lymphadenopathy, entire tongue is coated with white to yellowish    Commons side effects, risks, benefits, and alternatives for medications and treatment plan prescribed today were discussed, and the patient expressed understanding of the given instructions. Patient is instructed to call or message via MyChart if he/she has any questions or concerns regarding our treatment plan. No barriers to understanding were identified. We discussed Red Flag symptoms and signs in detail. Patient expressed understanding regarding what to do in case of urgent or emergency type symptoms.   Medication list was reconciled, printed and provided to the  patient in AVS. Patient instructions and summary information was reviewed with the patient as documented in the AVS. This note was prepared with assistance of Dragon voice recognition software. Occasional wrong-word or sound-a-like substitutions may have occurred due to the inherent limitations of voice recognition software  This visit occurred during the SARS-CoV-2 public health emergency.  Safety protocols were in place, including screening questions prior to the visit, additional usage of staff PPE, and extensive cleaning of exam room while observing appropriate contact time as indicated for disinfecting solutions.

## 2020-11-02 NOTE — Patient Instructions (Addendum)
Please follow up if symptoms do not improve or as needed.   Start rinsing your mouth out with water after using your Airduo digihaler. This can help prevent thrush.  Oral Thrush, Adult Oral thrush, also called oral candidiasis, is a fungal infection that develops in the mouth and throat and on the tongue. It causes white patches to form in the mouth and on the tongue. Many cases of thrush are mild, but this infection can also be serious. [place] can be a repeated (recurrent) problem for certain people who have a weak body defense system (immune system). The weakness can be caused by chronic illnesses, or by taking medicines that limit the body's ability to fight infection. If a person has difficulty fighting infection, the fungus that causes thrush can spread through the body. This can cause life-threatening blood or organ infections. What are the causes? This condition is caused by a fungus (yeast) called Candida albicans.  This fungus is normally present in small amounts in the mouth and on other mucous membranes. It usually causes no harm.  If conditions are present that allow the fungus to grow without control, it invades surrounding tissues and becomes an infection.  Other Candida species can also lead to thrush, though this is rare. What increases the risk? The following factors may make you more likely to develop this condition:  Having a weakened immune system.  Being an older adult.  Having diabetes, cancer, or HIV (human immunodeficiency virus).  Having dry mouth (xerostomia).  Being pregnant or breastfeeding.  Having poor dental care, especially in those who have dentures.  Using antibiotic or steroid medicines. What are the signs or symptoms? Symptoms of this condition can vary from mild and moderate to severe and persistent. Symptoms may include:  A burning feeling in the mouth and throat. This can occur at the start of a thrush infection.  White patches that stick to  the mouth and tongue. The tissue around the patches may be red, raw, and painful. If rubbed (during tooth brushing, for example), the patches and the tissue of the mouth may bleed easily.  A bad taste in the mouth or difficulty tasting foods.  A cottony feeling in the mouth.  Pain during eating and swallowing.  Poor appetite.  Cracking at the corners of the mouth.   How is this diagnosed? This condition is diagnosed based on:  A physical exam.  Your medical history. How is this treated? This condition is treated with medicines called antifungals, which prevent the growth of fungi. These medicines are either applied directly to the affected area (topical) or swallowed (oral). The treatment will depend on the severity of the condition.  Mild cases of thrush may be treated with an antifungal mouth rinse or lozenges. Treatment usually lasts about 14 days.  Moderate to severe cases of thrush can be treated with oral antifungal medicine, if they have spread to the esophagus. A topical antifungal medicine may also be used. For some severe infections, treatment may need to continue for more than 14 days. ? Oral antifungal medicines are rarely used during pregnancy because they may be harmful to the unborn child. If you are pregnant, talk with your health care provider about options for treatment.  Persistent or recurrent thrush. For cases of thrush that do not go away or keep coming back: ? Treatment may be needed twice as long as the symptoms last. ? Treatment will include both oral and topical antifungal medicines. ? People with a weakened immune  system can take an antifungal medicine on a continuous basis to prevent thrush infections. It is important to treat conditions that make a person more likely to get thrush, such as diabetes or HIV. Follow these instructions at home: Medicines  Take or use over-the-counter and prescription medicines only as told by your health care  provider.  Talk with your health care provider about an over-the-counter medicine called gentian violet, which kills bacteria and fungi. Relieving soreness and discomfort To help reduce the discomfort of thrush:  Drink cold liquids such as water or iced tea.  Try flavored ice treats or frozen juices.  Eat foods that are easy to swallow, such as gelatin, ice cream, or custard.  Try drinking from a straw if the patches in your mouth are painful.   General instructions  Eat plain, unflavored yogurt as directed by your health care provider. Check the label to make sure the yogurt contains live cultures. This yogurt can help healthy bacteria grow in the mouth and can stop the growth of the fungus that causes thrush.  If you wear dentures, remove the dentures before going to bed, brush them vigorously, and soak them in a cleaning solution as directed by your health care provider.  Rinse your mouth with a warm salt-water mixture several times a day. To make a salt-water mixture, dissolve -1 tsp (3-6 g) of salt in 1 cup (237 mL) of warm water. Contact a health care provider if:  Your symptoms are getting worse or are not improving within 7 days of starting treatment.  You have symptoms of a spreading infection, such as white patches on the skin outside of the mouth.  You are breastfeeding your baby and you have redness and pain in the nipples. Summary  Oral thrush, also called oral candidiasis, is a fungal infection that develops in the mouth and throat and on the tongue. It causes white patches to form in the mouth and on the tongue.  You are more likely to get this condition if you have a weakened immune system or an underlying condition, such as HIV, cancer, or diabetes.  This condition is treated with medicines called antifungals, which prevent the growth of fungi.  Contact a health care provider if your symptoms do not improve, or get worse, within 7 days of starting treatment. This  information is not intended to replace advice given to you by your health care provider. Make sure you discuss any questions you have with your health care provider. Document Revised: 05/01/2019 Document Reviewed: 05/01/2019 Elsevier Patient Education  2021 [employer].

## 2020-11-03 ENCOUNTER — Ambulatory Visit: Payer: BC Managed Care – PPO | Admitting: Family Medicine

## 2020-11-10 DIAGNOSIS — F4312 Post-traumatic stress disorder, chronic: Secondary | ICD-10-CM | POA: Diagnosis not present

## 2020-11-11 ENCOUNTER — Other Ambulatory Visit: Payer: Self-pay | Admitting: Neurology

## 2020-11-14 ENCOUNTER — Encounter: Payer: Self-pay | Admitting: Family Medicine

## 2020-11-14 ENCOUNTER — Other Ambulatory Visit: Payer: Self-pay | Admitting: Neurology

## 2020-11-14 MED ORDER — ONDANSETRON 8 MG PO TBDP: 8.0000 mg | ORAL_TABLET | Freq: Three times a day (TID) | ORAL | 3 refills | Status: DC | PRN

## 2020-11-15 DIAGNOSIS — F4312 Post-traumatic stress disorder, chronic: Secondary | ICD-10-CM | POA: Diagnosis not present

## 2020-11-22 ENCOUNTER — Ambulatory Visit: Payer: BC Managed Care – PPO | Admitting: Family Medicine

## 2020-11-22 DIAGNOSIS — F4312 Post-traumatic stress disorder, chronic: Secondary | ICD-10-CM | POA: Diagnosis not present

## 2020-11-22 NOTE — Patient Instructions (Addendum)
Consistent bedtime and take melatonin an hour before bedtime 1-3 mg. Can take hydroxyzine if needed as well  Update [place] if not making progress in 2-3 weeks- I think sleeping better will help you feel better  Consider dermatology visit if lip spot does not improve- not sure what this is.   Please stop by lab before you go If you have mychart- we will send your results within 3 business days of [place] receiving them.  If you do not have mychart- we will call you about results within 5 business days of [place] receiving them.  *please also note that you will see labs on mychart as soon as they post. I will later go in and write notes on them- will say "notes from Dr. Durene Cal"

## 2020-11-22 NOTE — Progress Notes (Incomplete)
Phone: [phone]    Subjective:  Patient presents today for their annual physical. Chief complaint-noted.   See problem oriented charting- ROS- full  review of systems was completed and negative  except for: ***  The following were reviewed and entered/updated in epic: Past Medical History:  Diagnosis Date  . Asthma   . Hypertension   . IBS (irritable bowel syndrome)    probiotic  . Orthostatic hypertension   . Posterior urethral valves   . Premature birth    3 months early  . Renal disorder   . Verbal apraxia    Patient Active Problem List   Diagnosis Date Noted  . Orthostatic hypotension 09/02/2020  . Syncope 05/27/2020  . Palpitations 05/27/2020  . PTSD (post-traumatic stress disorder) 10/19/2019  . Daily headache 06/02/2019  . Asthma, severe persistent 05/25/2019  . Social anxiety disorder 05/04/2018  . Insomnia 05/04/2018  . Mesenteric lymphadenopathy 07/27/2016  . Posterior urethral valves (PUV) 06/04/2016  . GERD (gastroesophageal reflux disease) 06/04/2016  . Essential hypertension 06/04/2016  . Bladder spasms 06/04/2016  . GAD (generalized anxiety disorder) 06/04/2016  . CKD (chronic kidney disease), stage II 06/04/2016  . Major depression, recurrent (HCC) 06/04/2016  . Premature birth   . IBS (irritable bowel syndrome)   . Verbal apraxia    Past Surgical History:  Procedure Laterality Date  . ADENOIDECTOMY    . ESOPHAGEAL MANOMETRY N/A 01/23/2016   Procedure: ESOPHAGEAL MANOMETRY (EM);  Surgeon: Charna Elizabeth, MD;  Location: [facility];  Service: Endoscopy;  Laterality: N/A;  . LASIK     eye surgery july 2017  . NEPHRECTOMY     rght   . TONSILLECTOMY      Family History  Problem Relation Age of Onset  . Breast cancer Mother        survivor  . Hypertension Mother   . Hyperlipidemia Mother   . Other Father        pituitary tumor- gamma knife radiation  . Heart disease Maternal Grandfather   . Prostate cancer Maternal Grandfather         73s  . Melanoma Sister   . Prostate cancer Paternal Grandfather        38s  . Ovarian cancer Maternal Aunt        mets to liver     Medications- reviewed and updated Current Outpatient Medications  Medication Sig Dispense Refill  . AIMOVIG 140 MG/ML SOAJ INJECT INTO THE SKIN EVERY 30 DAYS 1 mL 3  . AIRDUO DIGIHALER 232-14 MCG/ACT AEPB Inhale 1 puff into the lungs in the morning and at bedtime. 1 each 5  . ALPRAZolam (XANAX) 1 MG tablet Take 1 mg by mouth 2 (two) times daily as needed for anxiety.     Marland Kitchen aluminum chloride (DRYSOL) 20 % external solution Apply topically at bedtime. once excessive sweating has stopped, may decrease to once or twice weekly, or as needed. Wash treated area in the morning. 60 mL 5  . ALVESCO 80 MCG/ACT inhaler Inhale 2 puffs into the lungs 2 (two) times daily. 2 each 5  . busPIRone (BUSPAR) 30 MG tablet TAKE 1 TABLET BY MOUTH TWICE A DAY 180 tablet 1  . Desvenlafaxine Succinate (PRISTIQ PO) Take 150 mg by mouth. Take 1 tablet in the morning    . EPINEPHRINE 0.3 mg/0.3 mL IJ SOAJ injection INJECT 0.3 MLS INTO THE MUSCLE ONCE FOR 1 DOSE. AS DIRECTED FOR LIFE-THREATENING ALLERGIC REACTIONS 2 each 1  . ketoconazole (NIZORAL) 2 % cream Apply  1 application topically daily. For athlete's foot 60 g 1  . levalbuterol (XOPENEX HFA) 45 MCG/ACT inhaler TAKE 2 PUFFS BY MOUTH EVERY 4 HOURS AS NEEDED FOR WHEEZE 15 each 12  . levalbuterol (XOPENEX) 1.25 MG/3ML nebulizer solution 1 vial via nebulizer every 4-6 hours as needed. 75 mL 1  . losartan (COZAAR) 50 MG tablet Take 50 mg by mouth daily.    Marland Kitchen nystatin (MYCOSTATIN) 100000 UNIT/ML suspension Take 92ml-10ml by mouth, swish and spit, 4 times daily for a week. 60 mL 0  . ondansetron (ZOFRAN-ODT) 8 MG disintegrating tablet Take 1 tablet (8 mg total) by mouth every 8 (eight) hours as needed for nausea or vomiting. 20 tablet 3  . rizatriptan (MAXALT) 10 MG tablet Take 1 tablet earliest onset of migraine.  May repeat in 2 hours if  needed.  Maximum 2 tablets in 24 hours 10 tablet 5   Current Facility-Administered Medications  Medication Dose Route Frequency Provider Last Rate Last Admin  . Mepolizumab SOLR 100 mg  100 mg Subcutaneous Q28 days Marcelyn Bruins, MD   100 mg at 01/12/20 1725    Allergies-reviewed and updated Allergies  Allergen Reactions  . Albuterol     High HR  . Chocolate     Face swelling/sounds like angioedema "I had to go to urgent care today due to an allergic reaction to a chocolate candy bar. My throat swelled up and was closing, my nasal passages were congested, and my face was hurting. They gave me a steroid shot at the clinic, and then they sent me home presidone to take for a week" 06/2019  . Lisinopril Other (See Comments)  . Other Other (See Comments)    Pain med? Unsure of medication, caused n/v    Social History   Social History Narrative   Single. Lives with mom and dad. His brother and wife and nephew also live with him. Brother and sister- healthy.       Full time at [employer]. Studying psychology with biology minor.    Thinking grad school for psychology- would plan for research over clinical   Right handed   Two story home   Does drink caffeine, one large coffee daily      Objective:  There were no vitals taken for this visit. Gen: NAD, resting comfortably HEENT: Mucous membranes are moist. Oropharynx normal Neck: no thyromegaly CV: RRR no murmurs rubs or gallops Lungs: CTAB no crackles, wheeze, rhonchi Abdomen: soft/nontender/nondistended/normal bowel sounds. No rebound or guarding.  Ext: no edema Skin: warm, dry Neuro: grossly normal, moves all extremities, PERRLA ***    Assessment and Plan:  25 y.o. male presenting for annual physical.  Health Maintenance counseling: 1. Anticipatory guidance: Patient counseled regarding regular dental exams ***q6 months, eye exams ***,  avoiding smoking and second hand smoke*** , limiting alcohol to 2  beverages per day***.   2. Risk factor reduction:  Advised patient of need for regular exercise and diet rich and fruits and vegetables to reduce risk of heart attack and stroke. Exercise- ***. Diet-***.  Wt Readings from Last 3 Encounters:  11/02/20 171 lb 3.2 oz (77.7 kg)  10/13/20 172 lb (78 kg)  09/16/20 165 lb (74.8 kg)   3. Immunizations/screenings/ancillary studies Immunization History  Administered Date(s) Administered  . Influenza, High Dose Seasonal PF 05/22/2018, 03/12/2019  . Influenza, Seasonal, Injecte, Preservative Fre 05/22/2018, 03/12/2019  . Influenza,inj,Quad PF,6+ Mos 05/22/2018, 03/12/2019, 02/27/2020  . Influenza-Unspecified 03/08/2016  . PFIZER Comirnaty(Gray Top)Covid-19 Tri-Sucrose Vaccine  07/29/2020  . PFIZER(Purple Top)SARS-COV-2 Vaccination 02/06/2020, 02/27/2020  . Tdap 05/22/2018   There are no preventive care reminders to display for this patient.  Family History  Problem Relation Age of Onset  . Breast cancer Mother        survivor  . Hypertension Mother   . Hyperlipidemia Mother   . Other Father        pituitary tumor- gamma knife radiation  . Heart disease Maternal Grandfather   . Prostate cancer Maternal Grandfather        3s  . Melanoma Sister   . Prostate cancer Paternal Grandfather        45s  . Ovarian cancer Maternal Aunt        mets to liver    4. Prostate cancer screening- *** No results found for: PSA 5. Colon cancer screening - *** 6. Skin cancer screening/prevention- ***advised regular sunscreen use. Denies worrisome, changing, or new skin lesions.  7. Testicular cancer screening- advised monthly self exams *** 8. STD screening- patient opts *** 9. *** smoker-   Status of chronic or acute concerns   @SPECCOMM @ *** No diagnosis found.  Recommended follow up:No follow-ups on file. Future Appointments  Date Time Provider Department Center  11/22/2020 10:40 AM 11/24/2020, MD [facility]  12/01/2020  2:30 PM 12/03/2020, MD [facility] None  12/21/2020  9:40 AM 12/23/2020, MD [facility]  04/10/2021  2:30 PM 06/10/2021, DO [facility] None  04/14/2021  3:00 PM 06/14/2021, MD [facility]    No chief complaint on file.  Lab/Order associations:*** fasting No diagnosis found.  No orders of the defined types were placed in this encounter.  I,Harris Phan,acting as a Shelva Majestic for Neurosurgeon, MD.,have documented all relevant documentation on the behalf of Tana Conch, MD,as directed by  Tana Conch, MD while in the presence of Tana Conch, MD.  ***  Return precautions advised.   Tana Conch

## 2020-11-22 NOTE — Progress Notes (Signed)
Phone [phone] In person visit   Subjective:   [name] is a 25 y.o. year old very pleasant male patient who presents for/with See problem oriented charting Chief Complaint  Patient presents with  . Dysautonomia    Pt said he is feeling more tired and some lightheadedness x 2 weeks.  James Matthews. Thrush    Pt was told by Dentist that he has a fungal infection on his tongue.   This visit occurred during the SARS-CoV-2 public health emergency.  Safety protocols were in place, including screening questions prior to the visit, additional usage of staff PPE, and extensive cleaning of exam room while observing appropriate contact time as indicated for disinfecting solutions.   Past Medical History-  Patient Active Problem List   Diagnosis Date Noted  . Daily headache 06/02/2019    Priority: High  . Asthma, severe persistent 05/25/2019    Priority: High  . Major depression, recurrent (HCC) 06/04/2016    Priority: High  . Verbal apraxia     Priority: High  . PTSD (post-traumatic stress disorder) 10/19/2019    Priority: Medium  . Posterior urethral valves (PUV) 06/04/2016    Priority: Medium  . Essential hypertension 06/04/2016    Priority: Medium  . Bladder spasms 06/04/2016    Priority: Medium  . GAD (generalized anxiety disorder) 06/04/2016    Priority: Medium  . CKD (chronic kidney disease), stage II 06/04/2016    Priority: Medium  . Mesenteric lymphadenopathy 07/27/2016    Priority: Low  . GERD (gastroesophageal reflux disease) 06/04/2016    Priority: Low  . Premature birth     Priority: Low  . IBS (irritable bowel syndrome)     Priority: Low  . Orthostatic hypotension 09/02/2020  . Syncope 05/27/2020  . Palpitations 05/27/2020  . Social anxiety disorder 05/04/2018  . Insomnia 05/04/2018    Medications- reviewed and updated Current Outpatient Medications  Medication Sig Dispense Refill  . AIMOVIG 140 MG/ML SOAJ INJECT INTO THE SKIN EVERY 30 DAYS 1 mL 3  .  AIRDUO DIGIHALER 232-14 MCG/ACT AEPB Inhale 1 puff into the lungs in the morning and at bedtime. 1 each 5  . ALPRAZolam (XANAX) 1 MG tablet Take 1 mg by mouth 2 (two) times daily as needed for anxiety.     Marland Kitchen. aluminum chloride (DRYSOL) 20 % external solution Apply topically at bedtime. once excessive sweating has stopped, may decrease to once or twice weekly, or as needed. Wash treated area in the morning. 60 mL 5  . ALVESCO 80 MCG/ACT inhaler Inhale 2 puffs into the lungs 2 (two) times daily. 2 each 5  . ARNUITY ELLIPTA 100 MCG/ACT AEPB Inhale 1 puff into the lungs daily.    . busPIRone (BUSPAR) 30 MG tablet TAKE 1 TABLET BY MOUTH TWICE A DAY 180 tablet 1  . desvenlafaxine (PRISTIQ) 100 MG 24 hr tablet Take 200 mg by mouth daily.    Marland Kitchen. EPINEPHRINE 0.3 mg/0.3 mL IJ SOAJ injection INJECT 0.3 MLS INTO THE MUSCLE ONCE FOR 1 DOSE. AS DIRECTED FOR LIFE-THREATENING ALLERGIC REACTIONS 2 each 1  . FASENRA PEN 30 MG/ML SOAJ Inject into the skin.    . hydrOXYzine (ATARAX/VISTARIL) 25 MG tablet Take 0.5-1 tablets (12.5-25 mg total) by mouth at bedtime as needed for anxiety (insomnia). Take one 25 mg tablet 30-60 minutes prior to bedtime for insomnia, anxiety. 30 tablet 0  . ketoconazole (NIZORAL) 2 % cream Apply 1 application topically daily. For athlete's foot 60 g 1  . levalbuterol (XOPENEX  HFA) 45 MCG/ACT inhaler TAKE 2 PUFFS BY MOUTH EVERY 4 HOURS AS NEEDED FOR WHEEZE 15 each 12  . levalbuterol (XOPENEX) 1.25 MG/3ML nebulizer solution 1 vial via nebulizer every 4-6 hours as needed. 75 mL 1  . losartan (COZAAR) 50 MG tablet Take 50 mg by mouth daily.    Marland Kitchen nystatin (MYCOSTATIN) 100000 UNIT/ML suspension Take 86ml-10ml by mouth, swish and spit, 4 times daily for a week. 60 mL 0  . ondansetron (ZOFRAN-ODT) 8 MG disintegrating tablet Take 1 tablet (8 mg total) by mouth every 8 (eight) hours as needed for nausea or vomiting. 20 tablet 3  . rizatriptan (MAXALT) 10 MG tablet Take 1 tablet earliest onset of migraine.   May repeat in 2 hours if needed.  Maximum 2 tablets in 24 hours 10 tablet 5   Current Facility-Administered Medications  Medication Dose Route Frequency Provider Last Rate Last Admin  . Mepolizumab SOLR 100 mg  100 mg Subcutaneous Q28 days Marcelyn Bruins, MD   100 mg at 01/12/20 1725     Objective:  BP 100/70 (BP Location: Left Arm, Patient Position: Sitting, Cuff Size: Normal)   Pulse 87   Temp 97.8 F (36.6 C) (Temporal)   Ht 6\' 1"  (1.854 m)   Wt 166 lb 6.1 oz (75.5 kg)   SpO2 97%   BMI 21.95 kg/m  Gen: NAD, resting comfortably No thyromegaly CV: RRR no murmurs rubs or gallops Lungs: CTAB no crackles, wheeze, rhonchi Abdomen: soft/nontender/nondistended/normal bowel sounds.  Ext: no edema Skin: warm, dry     Assessment and Plan   #Dysautonomia #Insomnia #Thrush S:he feels like he is having worsening issues with this-he feels like heart rate is running lower but also when he is active heart rate elevates still such as standing up from seated position- he feels exhausted most of the time. 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 Falling asleep 3 AM and has to be ready for work by 10 so getting under 6 hours at a time he has to wake up. He functions his best at 9-10 hours of sleep   Started not feeling well and then this seemed to affect sleep- sleep issues seem to come after him starting to feel poorly.  In regards to sleep hygiene and insomnia-tries to avoid screens an hour before bed- taking a bath, cleaning up room some, brush teeth. CBT- I before. Uses eye cover already. Exercises in the morning when exercising. Amitriptyline helpful in past- has tried 10 other medicines int he past- lunesta, ambien, trazodone. Melatonin from 3-10 mg not effetive in the past.   Has had some numb sensation on lips on both sides- also noted a dry patch above left lip that will flake off at times- moisturizer  only helps slightly but recurs. Does not hurt or itch in particularly. Slight red patch on right neck starting in last few hours- no itch or pain with that.  He did start Drysol within the last month as well as Zofran for nausea intermittently.Drysol working well. zofran helping nausea.   He has started to feel somewhat better-Drinking liquid IV- needs 2-3L of this to feel better typically per day- feels lightheaded, fatigued if doesn't drink this.   He also developed thrush and is being treated by the dentist and working with allergist about potentially changing his inhalers  Patient also with a slight crusted over area over left lip.  He also developed a slight rash on his right neck sometime this morning but was resolving by the time of end of visit A/P: Dysautonomia seems to have worsened but is improving with liquid IV.  Patient asks about doing actual IV fluids- we do not have a great set up for this and currently do not have any when trying to do IV within the office.  I do not believe the infusion center does IV fluids at this time.  I encouraged him to continue the liquid IV which has been most helpful for him-he also has an upcoming visit in [place] with the specialty center  For insomnia-melatonin can actually be somewhat beneficial for POTS or orthostatic symptoms-I wonder if you may see some benefit with this.  In addition it may help reset his sleep clock- discussed using 1 to 3 mg nightly for up to 2 weeks-can also use hydroxyzine as needed-new prescription today -Patient wanted to retrial amitriptyline which worked for him in the past for sleep but I was worried about orthostatic symptoms and plus he is on Pristiq 200 mg and buspirone  For thrush- he is being treated by his dentist and allergist is adjusting medications-no additional treatment needed  For fatigue-we will update labs and see if we see an obvious cause but I am thankful it is improving with the liquid IV  For lip  numbness issues-recommended trialing off of Drysol- I know this is not immediately where he has been contacted with the Drysol but I think it is reasonable to stop this since numbness started after starting medication.  Doubt this is related to stroke.  For lip lesion-recommended following up with dermatology if fails to improve-I think a lubricant like Vaseline would be reasonable for now-does not appear to be cancerous growth  #Vitamin D deficiency- has been told he was low when 18  S: Medication: Not currently on therapy Last vitamin D Lab Results  Component Value Date   VD25OH 53.64 05/25/2019  A/P: When last checked was adequately repleted but we will recheck since patient is experiencing fatigue.  Hopefully well controlled-we will replete if needed  Recommended follow up: Keep October visit or sooner if needed Future Appointments  Date Time Provider Department Center  12/01/2020  2:30 PM Marcelyn Bruins, MD [facility] None  12/21/2020  9:40 AM Christell Constant, MD [facility]  04/10/2021  2:30 PM Drema Dallas, DO [facility] None  04/14/2021  3:00 PM Shelva Majestic, MD [facility]    Lab/Order associations:   ICD-10-CM   1. Fatigue, unspecified type  R53.83 CBC with Differential/Platelet    Comprehensive metabolic panel    TSH    Vitamin B12  2. Vitamin D deficiency  E55.9 VITAMIN D 25 Hydroxy (Vit-D Deficiency, Fractures)  3. Paresthesias  R20.2 Vitamin B12    Meds ordered this encounter  Medications  . hydrOXYzine (ATARAX/VISTARIL) 25 MG tablet    Sig: Take 0.5-1 tablets (12.5-25 mg total) by mouth at bedtime as needed for anxiety (insomnia). Take one 25 mg tablet 30-60 minutes prior to bedtime for insomnia, anxiety.    Dispense:  30 tablet    Refill:  0    Time Spent: 41 minutes of total time (11:17 AM-11:52 AM, 1:30 PM- 1:36 PM) was spent on the date of the encounter performing the following actions: chart review prior to seeing the patient,  obtaining history, performing a medically necessary exam, counseling on the treatment plan including reinforcing sleep hygiene-patient honestly doing pretty  well with this, discussing limitations of certain medications, placing orders, and documenting in our EHR.   Return precautions advised.  Tana Conch, MD

## 2020-11-23 ENCOUNTER — Other Ambulatory Visit: Payer: Self-pay

## 2020-11-23 ENCOUNTER — Ambulatory Visit (INDEPENDENT_AMBULATORY_CARE_PROVIDER_SITE_OTHER): Payer: BC Managed Care – PPO | Admitting: Family Medicine

## 2020-11-23 ENCOUNTER — Encounter: Payer: Self-pay | Admitting: Family Medicine

## 2020-11-23 VITALS — BP 100/70 | HR 87 | Temp 97.8°F | Ht 73.0 in | Wt 166.4 lb

## 2020-11-23 DIAGNOSIS — R5383 Other fatigue: Secondary | ICD-10-CM | POA: Diagnosis not present

## 2020-11-23 DIAGNOSIS — E559 Vitamin D deficiency, unspecified: Secondary | ICD-10-CM

## 2020-11-23 DIAGNOSIS — R202 Paresthesia of skin: Secondary | ICD-10-CM

## 2020-11-23 LAB — CBC WITH DIFFERENTIAL/PLATELET
Basophils Absolute: 0 10*3/uL (ref 0.0–0.1)
Basophils Relative: 0.1 % (ref 0.0–3.0)
Eosinophils Absolute: 0 10*3/uL (ref 0.0–0.7)
Eosinophils Relative: 0 % (ref 0.0–5.0)
HCT: 39.2 % (ref 39.0–52.0)
Hemoglobin: 14.1 g/dL (ref 13.0–17.0)
Lymphocytes Relative: 31.9 % (ref 12.0–46.0)
Lymphs Abs: 1.2 10*3/uL (ref 0.7–4.0)
MCHC: 36 g/dL (ref 30.0–36.0)
MCV: 93 fl (ref 78.0–100.0)
Monocytes Absolute: 0.3 10*3/uL (ref 0.1–1.0)
Monocytes Relative: 8.8 % (ref 3.0–12.0)
Neutro Abs: 2.3 10*3/uL (ref 1.4–7.7)
Neutrophils Relative %: 59.2 % (ref 43.0–77.0)
Platelets: 253 10*3/uL (ref 150.0–400.0)
RBC: 4.22 Mil/uL (ref 4.22–5.81)
RDW: 12.1 % (ref 11.5–15.5)
WBC: 3.9 10*3/uL — ABNORMAL LOW (ref 4.0–10.5)

## 2020-11-23 LAB — COMPREHENSIVE METABOLIC PANEL
ALT: 18 U/L (ref 0–53)
AST: 16 U/L (ref 0–37)
Albumin: 5.1 g/dL (ref 3.5–5.2)
Alkaline Phosphatase: 46 U/L (ref 39–117)
BUN: 13 mg/dL (ref 6–23)
CO2: 31 mEq/L (ref 19–32)
Calcium: 10.6 mg/dL — ABNORMAL HIGH (ref 8.4–10.5)
Chloride: 101 mEq/L (ref 96–112)
Creatinine, Ser: 1.33 mg/dL (ref 0.40–1.50)
GFR: 74.53 mL/min (ref >60.00)
Glucose, Bld: 89 mg/dL (ref 70–99)
Potassium: 5 mEq/L (ref 3.5–5.1)
Sodium: 142 mEq/L (ref 135–145)
Total Bilirubin: 0.9 mg/dL (ref 0.2–1.2)
Total Protein: 7.3 g/dL (ref 6.0–8.3)

## 2020-11-23 LAB — TSH: TSH: 2.57 u[IU]/mL (ref 0.35–4.50)

## 2020-11-23 LAB — VITAMIN B12: Vitamin B-12: 862 pg/mL (ref 211–911)

## 2020-11-23 LAB — VITAMIN D 25 HYDROXY (VIT D DEFICIENCY, FRACTURES): VITD: 39.61 ng/mL (ref 30.00–100.00)

## 2020-11-23 MED ORDER — HYDROXYZINE HCL 25 MG PO TABS: 12.5000 mg | ORAL_TABLET | Freq: Every evening | ORAL | 0 refills | Status: DC | PRN

## 2020-11-24 ENCOUNTER — Encounter: Payer: Self-pay | Admitting: Family Medicine

## 2020-11-25 ENCOUNTER — Ambulatory Visit: Payer: BC Managed Care – PPO | Admitting: Family Medicine

## 2020-11-25 NOTE — Telephone Encounter (Signed)
LVM for patient to call back and schedule appt

## 2020-11-29 DIAGNOSIS — F4312 Post-traumatic stress disorder, chronic: Secondary | ICD-10-CM | POA: Diagnosis not present

## 2020-12-01 ENCOUNTER — Ambulatory Visit (INDEPENDENT_AMBULATORY_CARE_PROVIDER_SITE_OTHER): Payer: BC Managed Care – PPO | Admitting: Allergy

## 2020-12-01 ENCOUNTER — Other Ambulatory Visit: Payer: Self-pay

## 2020-12-01 ENCOUNTER — Other Ambulatory Visit: Payer: Self-pay | Admitting: Allergy

## 2020-12-01 ENCOUNTER — Encounter: Payer: Self-pay | Admitting: Allergy

## 2020-12-01 VITALS — BP 102/78 | HR 100 | Temp 97.8°F | Resp 18 | Ht 73.0 in | Wt 164.4 lb

## 2020-12-01 DIAGNOSIS — T7800XA Anaphylactic reaction due to unspecified food, initial encounter: Secondary | ICD-10-CM

## 2020-12-01 DIAGNOSIS — H1013 Acute atopic conjunctivitis, bilateral: Secondary | ICD-10-CM

## 2020-12-01 DIAGNOSIS — J3089 Other allergic rhinitis: Secondary | ICD-10-CM | POA: Diagnosis not present

## 2020-12-01 DIAGNOSIS — J4550 Severe persistent asthma, uncomplicated: Secondary | ICD-10-CM

## 2020-12-01 MED ORDER — STRIVERDI RESPIMAT 2.5 MCG/ACT IN AERS: 2.0000 | INHALATION_SPRAY | Freq: Every day | RESPIRATORY_TRACT | 5 refills | Status: DC

## 2020-12-01 MED ORDER — LEVALBUTEROL TARTRATE 45 MCG/ACT IN AERO: INHALATION_SPRAY | RESPIRATORY_TRACT | 2 refills | Status: DC

## 2020-12-01 MED ORDER — ALVESCO 160 MCG/ACT IN AERS: 2.0000 | INHALATION_SPRAY | Freq: Two times a day (BID) | RESPIRATORY_TRACT | 5 refills | Status: DC

## 2020-12-01 NOTE — Patient Instructions (Addendum)
Asthma  - have access to levalbuterol inhaler 2 puffs or Xopenex 1 vial via nebulizer every 4-6 hours as needed for cough/wheeze/shortness of breath/chest tightness.  May use 15-20 minutes prior to activity.   Monitor frequency of use.   Provided with nebulizer today.  -stop AirDuo due to issues with thrush -use your Alvesco  4 puffs twice a day spacer device.    This is a pro-drug thus it is not active until it reaches lung tissue thus should reduce your risk for thrush.  -will prescribe a long-acting albuterol inhaler to use with Alvesco which would equal taking inhalers like AirDuo or Breo - Fasenra at this time does not appear to be providing the control I would like.  Thus have discussed changing to [place] monthly injections.  Riki Altes benefits, risks and protocol discussed today.  Will have Tammy call you to discuss this change -next steps would be pulmonary referral for pulmonary rehab if above changes are not effective enough in controlling asthma symptoms -good news is lung function looks great today!  Asthma control goals:   Full participation in all desired activities (may need albuterol before activity)  Albuterol use two time or less a week on average (not counting use with activity)  Cough interfering with sleep two time or less a month  Oral steroids no more than once a year  No hospitalizations  Allergies - continue avoidance measures for grass pollens, weed pollens, tree pollens, molds, dust mite, cat, mouse, mixed feathers - can use Zyrtec 10mg  daily as needed even with hydroxyzine if needed   - for watery eyes can use of Pataday 1 drop each eye daily as needed - for nasal congestion use of nasal steroid spray like Flonase no more than 3 times a week due to nosebleeds.  Provided with nasal saline gel to help moisturize the nose a bit better to decrease risk of nosebleeds.  Allergic reaction  - continue avoidance of tree nuts  - have access to self-injectable  epinephrine AuviQ 0.3mg  at all times  - follow emergency action plan in case of allergic reaction  Follow-up in 2-3 months or sooner if needed

## 2020-12-01 NOTE — Progress Notes (Signed)
Follow-up Note  RE: James Matthews MRN: [id number] DOB: 02/09/1996 Date of Office Visit: 12/01/2020   History of present illness: James Matthews is a 25 y.o. male presenting today for follow-up of severe persistent asthma, food allergy. He was last seen in the office on 09/01/20 by myself.  He presents today with his mother.   After his last visit he was changed from [place] to [place] as he was having more asthma flares on N[place].  It seemingly was becoming less effective.  He is not noting any improvement in his asthma symptoms on [place] now.  He is getting out of breath very fast.  When he goes to do regular activities like take a shower or his nighttime routine he has more shortness of breath.  He does require frequent use of his rescue medication.  He is currently taking air duo 1 puff twice a day with Alvesco 80 mcg 2 puffs twice a day.  He has been dealing with issues of thrush which she states is improved today.  He has been seeing his dentist who will states that his medications are driving the thrush.  He states when he uses his inhaler medications he does rinse his mouth after use.  The thrush is causing his taste to be affected as well as the self-confidence.  He is already been on 2 rounds of antifungals for thrush management.  He did contact me in regards to this and I discussed that the air duo likely it can contribute to thrush development as it does have inhaled steroid and it is not a pro drug.  I have discussed with him that Alvesco is a pro drug thus it is not active until his lung tissue dust in the mouth that should not be active in leading to the thrush.    He was told by his cardiologist that he has dysautonomia.  He states he is not sure which type of dysautonomia he has and will be going to [facility] clinic in June to the dysautonomia clinic there.  He is not sure if dysautonomia is impacting his asthma.  He states his body temperature can change drastically with  changes in his body position like going from laying down flat to being upright.  He is quite fatigued often.  He has increase in heart rate at times.  He continues to avoid tree nuts without accidental ingestion or need for epinephrine use.    His PCP prescribed hydroxyzine due to issues with insomia.    Review of systems: Review of Systems  Constitutional: Positive for malaise/fatigue.  HENT: Negative.   Eyes: Negative.   Respiratory: Positive for cough, shortness of breath and wheezing.   Cardiovascular: Positive for palpitations.  Gastrointestinal: Negative.   Musculoskeletal: Negative.   Skin: Negative.   Neurological: Negative.     All other systems negative unless noted above in HPI  Past medical/social/surgical/family history have been reviewed and are unchanged unless specifically indicated below.  No changes  Medication List: Current Outpatient Medications  Medication Sig Dispense Refill  . AIMOVIG 140 MG/ML SOAJ INJECT INTO THE SKIN EVERY 30 DAYS 1 mL 3  . ALPRAZolam (XANAX) 1 MG tablet Take 1 mg by mouth 2 (two) times daily as needed for anxiety.     Marland Kitchen aluminum chloride (DRYSOL) 20 % external solution Apply topically at bedtime. once excessive sweating has stopped, may decrease to once or twice weekly, or as needed. Wash treated area in the morning. 60 mL 5  .  busPIRone (BUSPAR) 30 MG tablet TAKE 1 TABLET BY MOUTH TWICE A DAY 180 tablet 1  . ciclesonide (ALVESCO) 160 MCG/ACT inhaler Inhale 2 puffs into the lungs 2 (two) times daily. 1 each 5  . desvenlafaxine (PRISTIQ) 100 MG 24 hr tablet Take 200 mg by mouth daily.    Marland Kitchen EPINEPHRINE 0.3 mg/0.3 mL IJ SOAJ injection INJECT 0.3 MLS INTO THE MUSCLE ONCE FOR 1 DOSE. AS DIRECTED FOR LIFE-THREATENING ALLERGIC REACTIONS 2 each 1  . FASENRA PEN 30 MG/ML SOAJ Inject into the skin.    . hydrOXYzine (ATARAX/VISTARIL) 25 MG tablet Take 0.5-1 tablets (12.5-25 mg total) by mouth at bedtime as needed for anxiety (insomnia). Take one 25  mg tablet 30-60 minutes prior to bedtime for insomnia, anxiety. 30 tablet 0  . levalbuterol (XOPENEX) 1.25 MG/3ML nebulizer solution 1 vial via nebulizer every 4-6 hours as needed. 75 mL 1  . losartan (COZAAR) 50 MG tablet Take 50 mg by mouth daily.    . Olodaterol HCl (STRIVERDI RESPIMAT) 2.5 MCG/ACT AERS Inhale 2 puffs into the lungs daily. Take with Alvesco 4 g 5  . ondansetron (ZOFRAN-ODT) 8 MG disintegrating tablet Take 1 tablet (8 mg total) by mouth every 8 (eight) hours as needed for nausea or vomiting. 20 tablet 3  . rizatriptan (MAXALT) 10 MG tablet Take 1 tablet earliest onset of migraine.  May repeat in 2 hours if needed.  Maximum 2 tablets in 24 hours 10 tablet 5  . fluconazole (DIFLUCAN) 100 MG tablet Take by mouth.    Marland Kitchen ketoconazole (NIZORAL) 2 % cream Apply 1 application topically daily. For athlete's foot (Patient not taking: Reported on 12/01/2020) 60 g 1  . levalbuterol (XOPENEX HFA) 45 MCG/ACT inhaler TAKE 2 PUFFS BY MOUTH EVERY 4 HOURS AS NEEDED FOR WHEEZE 15 each 2  . nystatin (MYCOSTATIN) 100000 UNIT/ML suspension Take 66ml-10ml by mouth, swish and spit, 4 times daily for a week. (Patient not taking: Reported on 12/01/2020) 60 mL 0   Current Facility-Administered Medications  Medication Dose Route Frequency Provider Last Rate Last Admin  . Mepolizumab SOLR 100 mg  100 mg Subcutaneous Q28 days Marcelyn Bruins, MD   100 mg at 01/12/20 1725     Known medication allergies: Allergies  Allergen Reactions  . Albuterol     High HR  . Chocolate     Face swelling/sounds like angioedema "I had to go to urgent care today due to an allergic reaction to a chocolate candy bar. My throat swelled up and was closing, my nasal passages were congested, and my face was hurting. They gave me a steroid shot at the clinic, and then they sent me home presidone to take for a week" 06/2019  . Lisinopril Other (See Comments)  . Other Other (See Comments)    Pain med? Unsure of medication,  caused n/v     Physical examination: Blood pressure 102/78, pulse 100, temperature 97.8 F (36.6 C), temperature source Temporal, resp. rate 18, height 6\' 1"  (1.854 m), weight 164 lb 6.4 oz (74.6 kg), SpO2 96 %.  General: Alert, interactive, in no acute distress. HEENT: PERRLA, TMs pearly gray, turbinates non-edematous without discharge, post-pharynx non erythematous. Neck: Supple without lymphadenopathy. Lungs: Mildly decreased breath sounds bilaterally without wheezing, rhonchi or rales. {no increased work of breathing. CV: Normal S1, S2 without murmurs. Abdomen: Nondistended, nontender. Skin: Warm and dry, without lesions or rashes. Extremities:  No clubbing, cyanosis or edema. Neuro:   Grossly intact.  Diagnositics/Labs:  Spirometry: FEV1: 5.61L  120%, FVC: 6.47L 114%, ratio consistent with nonobstructive pattern  Assessment and plan:   Asthma  - have access to levalbuterol inhaler 2 puffs or Xopenex 1 vial via nebulizer every 4-6 hours as needed for cough/wheeze/shortness of breath/chest tightness.  May use 15-20 minutes prior to activity.   Monitor frequency of use.   Provided with nebulizer today.  -stop AirDuo due to issues with thrush -use your Alvesco  4 puffs twice a day spacer device.    This is a pro-drug thus it is not active until it reaches lung tissue thus should reduce your risk for thrush.  -will prescribe a long-acting albuterol inhaler (Striverdi) to use with Alvesco which would equal taking inhalers like AirDuo or Breo - Fasenra at this time does not appear to be providing the control I would like.  Thus have discussed changing to [place] monthly injections.  Riki Altes benefits, risks and protocol discussed today.  Will have Tammy call you to discuss this change -next steps would be pulmonary referral for pulmonary rehab if above changes are not effective enough in controlling asthma symptoms -good news is lung function looks great today!  Asthma control  goals:   Full participation in all desired activities (may need albuterol before activity)  Albuterol use two time or less a week on average (not counting use with activity)  Cough interfering with sleep two time or less a month  Oral steroids no more than once a year  No hospitalizations  Allergies - continue avoidance measures for grass pollens, weed pollens, tree pollens, molds, dust mite, cat, mouse, mixed feathers - can use Zyrtec 10mg  daily as needed even with hydroxyzine if needed   - for watery eyes can use of Pataday 1 drop each eye daily as needed - for nasal congestion use of nasal steroid spray like Flonase no more than 3 times a week due to nosebleeds.  Provided with nasal saline gel to help moisturize the nose a bit better to decrease risk of nosebleeds.  Allergic reaction  - continue avoidance of tree nuts  - have access to self-injectable epinephrine AuviQ 0.3mg  at all times  - follow emergency action plan in case of allergic reaction  Follow-up in 2-3 months or sooner if needed  I appreciate the opportunity to take part in Osamu's care. Please do not hesitate to contact me with questions.  Sincerely,   , MD Allergy/Immunology [facility]

## 2020-12-02 ENCOUNTER — Encounter: Payer: Self-pay | Admitting: Family Medicine

## 2020-12-02 NOTE — Telephone Encounter (Signed)
Please advise to change from xopenex to albuterol 

## 2020-12-02 NOTE — Telephone Encounter (Signed)
PA has been submitted thru cover my meds waiting on response from [facility]

## 2020-12-06 ENCOUNTER — Telehealth: Payer: Self-pay

## 2020-12-06 ENCOUNTER — Other Ambulatory Visit: Payer: Self-pay

## 2020-12-06 DIAGNOSIS — R482 Apraxia: Secondary | ICD-10-CM

## 2020-12-06 NOTE — Telephone Encounter (Signed)
Please schedule an OV or virtual for pt mom to discuss her sons health with Dr. Durene Cal.

## 2020-12-06 NOTE — Telephone Encounter (Signed)
Received a call from Veto Kemps, James Matthews's mother is wanting to talk with Dr.Hunter about her son's health issues. DPR on file that states okay for [facility] to be able to talk with mom.

## 2020-12-07 ENCOUNTER — Telehealth: Payer: Self-pay | Admitting: *Deleted

## 2020-12-07 NOTE — Telephone Encounter (Signed)
Pa has been approved and sent to pharmacy

## 2020-12-07 NOTE — Telephone Encounter (Signed)
L./m for patient to contact me  to advise approval, copay card that will be emailed to him and submit to [facility]

## 2020-12-07 NOTE — Telephone Encounter (Signed)
Spoke to patient and advised approval and submit. Will reach once delivery set to advise appt to start therapy

## 2020-12-07 NOTE — Telephone Encounter (Signed)
-----   Message from [facility] Larose Hires, MD sent at 12/02/2020  5:10 PM EDT ----- He is not having the response to Archie" hope he would.  He is still quite symptomatic with his asthma.  Discussed [place] with him and he is interested in changing to this injectable.

## 2020-12-08 NOTE — Telephone Encounter (Signed)
LVM to schedule an appointment or come with patient next Thursday.

## 2020-12-12 ENCOUNTER — Other Ambulatory Visit: Payer: Self-pay

## 2020-12-12 DIAGNOSIS — R482 Apraxia: Secondary | ICD-10-CM

## 2020-12-13 DIAGNOSIS — F4312 Post-traumatic stress disorder, chronic: Secondary | ICD-10-CM | POA: Diagnosis not present

## 2020-12-15 ENCOUNTER — Other Ambulatory Visit: Payer: Self-pay

## 2020-12-15 ENCOUNTER — Encounter: Payer: Self-pay | Admitting: Family Medicine

## 2020-12-15 ENCOUNTER — Ambulatory Visit (INDEPENDENT_AMBULATORY_CARE_PROVIDER_SITE_OTHER): Payer: BC Managed Care – PPO | Admitting: Family Medicine

## 2020-12-15 ENCOUNTER — Other Ambulatory Visit: Payer: Self-pay | Admitting: Family Medicine

## 2020-12-15 VITALS — BP 108/60 | HR 115 | Temp 97.6°F | Ht 73.0 in | Wt 163.6 lb

## 2020-12-15 DIAGNOSIS — K130 Diseases of lips: Secondary | ICD-10-CM

## 2020-12-15 DIAGNOSIS — I10 Essential (primary) hypertension: Secondary | ICD-10-CM | POA: Diagnosis not present

## 2020-12-15 MED ORDER — LOSARTAN POTASSIUM 50 MG PO TABS: 75.0000 mg | ORAL_TABLET | Freq: Every day | ORAL | 5 refills | Status: AC

## 2020-12-15 NOTE — Progress Notes (Signed)
Phone [phone] In person visit   Subjective:   James Matthews is a 25 y.o. year old very pleasant male patient who presents for/with See problem oriented charting Chief Complaint  Patient presents with   Dizziness   This visit occurred during the SARS-CoV-2 public health emergency.  Safety protocols were in place, including screening questions prior to the visit, additional usage of staff PPE, and extensive cleaning of exam room while observing appropriate contact time as indicated for disinfecting solutions.   Past Medical History-  Patient Active Problem List   Diagnosis Date Noted   Daily headache 06/02/2019    Priority: High   Asthma, severe persistent 05/25/2019    Priority: High   Major depression, recurrent (HCC) 06/04/2016    Priority: High   Verbal apraxia     Priority: High   PTSD (post-traumatic stress disorder) 10/19/2019    Priority: Medium   Posterior urethral valves (PUV) 06/04/2016    Priority: Medium   Essential hypertension 06/04/2016    Priority: Medium   Bladder spasms 06/04/2016    Priority: Medium   GAD (generalized anxiety disorder) 06/04/2016    Priority: Medium   CKD (chronic kidney disease), stage II 06/04/2016    Priority: Medium   Mesenteric lymphadenopathy 07/27/2016    Priority: Low   GERD (gastroesophageal reflux disease) 06/04/2016    Priority: Low   Premature birth     Priority: Low   IBS (irritable bowel syndrome)     Priority: Low   Orthostatic hypotension 09/02/2020   Syncope 05/27/2020   Palpitations 05/27/2020   Social anxiety disorder 05/04/2018   Insomnia 05/04/2018    Medications- reviewed and updated Current Outpatient Medications  Medication Sig Dispense Refill   AIMOVIG 140 MG/ML SOAJ INJECT INTO THE SKIN EVERY 30 DAYS 1 mL 3   ALPRAZolam (XANAX) 1 MG tablet Take 1 mg by mouth 2 (two) times daily as needed for anxiety.      aluminum chloride (DRYSOL) 20 % external solution Apply topically at bedtime. once  excessive sweating has stopped, may decrease to once or twice weekly, or as needed. Wash treated area in the morning. 60 mL 5   busPIRone (BUSPAR) 30 MG tablet TAKE 1 TABLET BY MOUTH TWICE A DAY 180 tablet 1   ciclesonide (ALVESCO) 160 MCG/ACT inhaler Inhale 2 puffs into the lungs 2 (two) times daily. 1 each 5   desvenlafaxine (PRISTIQ) 100 MG 24 hr tablet Take 200 mg by mouth daily.     EPINEPHRINE 0.3 mg/0.3 mL IJ SOAJ injection INJECT 0.3 MLS INTO THE MUSCLE ONCE FOR 1 DOSE. AS DIRECTED FOR LIFE-THREATENING ALLERGIC REACTIONS 2 each 1   hydrOXYzine (ATARAX/VISTARIL) 25 MG tablet TAKE 0.5-1 TABLETS (12.5-25 MG TOTAL) BY MOUTH AT BEDTIME AS NEEDED FOR ANXIETY (INSOMNIA). TAKE ONE 25 MG TABLET 30-60 MINUTES PRIOR TO BEDTIME FOR INSOMNIA, ANXIETY. 90 tablet 1   levalbuterol (XOPENEX HFA) 45 MCG/ACT inhaler Inhale 2 puffs into the lungs every 4 (four) hours as needed for wheezing. 15 g 1   levalbuterol (XOPENEX) 1.25 MG/3ML nebulizer solution 1 vial via nebulizer every 4-6 hours as needed. 75 mL 1   Olodaterol HCl (STRIVERDI RESPIMAT) 2.5 MCG/ACT AERS Inhale 2 puffs into the lungs daily. Take with Alvesco 4 g 5   ondansetron (ZOFRAN-ODT) 8 MG disintegrating tablet Take 1 tablet (8 mg total) by mouth every 8 (eight) hours as needed for nausea or vomiting. 20 tablet 3   rizatriptan (MAXALT) 10 MG tablet Take 1 tablet earliest onset  of migraine.  May repeat in 2 hours if needed.  Maximum 2 tablets in 24 hours 10 tablet 5   ketoconazole (NIZORAL) 2 % cream Apply 1 application topically daily. For athlete's foot (Patient not taking: No sig reported) 60 g 1   losartan (COZAAR) 50 MG tablet Take 1.5 tablets (75 mg total) by mouth daily. 45 tablet 5   nystatin (MYCOSTATIN) 100000 UNIT/ML suspension Take 67ml-10ml by mouth, swish and spit, 4 times daily for a week. (Patient not taking: No sig reported) 60 mL 0   No current facility-administered medications for this visit.     Objective:  BP 108/60   Pulse  (!) 115   Temp 97.6 F (36.4 C) (Temporal)   Ht 6\' 1"  (1.854 m)   Wt 163 lb 9.6 oz (74.2 kg)   SpO2 98%   BMI 21.58 kg/m  Gen: NAD, resting comfortably     Assessment and Plan  #Social Updates: He has a on July 18th-he is worried how his fatigue may affect this.  #hypertension S: medication:  Losartan 100 mg Home readings #s: Have also been running lower at times BP Readings from Last 3 Encounters:  12/15/20 108/60  12/01/20 102/78  11/23/20 100/70  A/P: Perhaps overcontrolled and contributing to fatigue-on the other hand when he was on 50 mg his blood pressures significantly increased-we opted to take an intermediate step and reduce Losartan to 75 mg.  He is going to run this by his nephrologist before making the change  # Insomnia S:medications: Hydroxyzine 25 mg- he reports that it is working- getting 9 hours of sleep per night.  He feels like his overall fatigue has improved with this though certainly not resolved A/P: Significant improvement-thrilled hydroxyzine has been helpful-continue current medication -Severe insomnia issues in the past and has been tried on multiple medications  # Dry Spot on Left Upper Lip S:medications: Has been trying Aquaphor consistently -Patient noted dry scaly spot on left upper lip at last visit. -He also reports some numbness on lower portion of face bilaterally but this has been a more long-term issue-not severe enough at present to seek neurology visit A/P: I am not sure what is causing the dry flaky patch on his left upper lip-Aquaphor has been helpful-he may continue this medication  # Weakness/Fatigue/dizziness-issue started when dysautonomia symptoms started earlier this year S:His mother reports that his strength seems to be worsening and that he fatigues easily- when they are out in public he gets very tired sometimes not even able to go into stores. He has a 11/25/20 coming up and she is very concerned. He  recently had head shots- around 3 p.m.- and noted that in every picture that his eye will start drooping. He is unable to walk the stairs at home at times due to fatigue.  Family members feel like he is just not acting like himself since he started having the symptoms.  Patient has had cardiology evaluation including echocardiogram  Many of patient's issues started once he started having more consistent issues of dysautonomia.  Has upcoming specialty visit with a clinic in [place]  Of note fatigue and sleepiness have improved since he has been getting better sleep recently with started hydroxyzine A/P: I do wonder if lower blood pressures are contributing to excess sensation of fatigue-recommended trying reducing BP meds as above.  I am also interested to see what the dysautonomia clinic recommends for patient.  Patient also has upcoming cardiology follow-up-at this  time I do not think he needs additional cardiac work-up for his symptoms  #Repeat blood work-mild abnormalities last visit with slight white count decrease and very slight hypercalcemia-patient would like to repeat at this time  Recommended follow up: No follow-ups on file. Future Appointments  Date Time Provider Department Center  12/21/2020  9:40 AM Christell Constant, MD [facility]  02/23/2021  3:10 PM Delorse Lek, Pilar Grammes, MD [facility] None  04/10/2021  2:30 PM Drema Dallas, DO [facility] None  04/14/2021  3:00 PM Shelva Majestic, MD [facility]    Lab/Order associations:   ICD-10-CM   1. Lip dryness  K13.0 Ambulatory referral to Dermatology    2. Essential hypertension  I10 CBC with Differential/Platelet    Comprehensive metabolic panel      Meds ordered this encounter  Medications   losartan (COZAAR) 50 MG tablet    Sig: Take 1.5 tablets (75 mg total) by mouth daily.    Dispense:  45 tablet    Refill:  5    I,Harris Phan,acting as a scribe for Tana Conch, MD.,have documented all relevant  documentation on the behalf of Tana Conch, MD,as directed by  Tana Conch, MD while in the presence of Tana Conch, MD.   I, Tana Conch, MD, have reviewed all documentation for this visit. The documentation on 12/15/20 for the exam, diagnosis, procedures, and orders are all accurate and complete.   Return precautions advised.  Tana Conch, MD

## 2020-12-15 NOTE — Patient Instructions (Addendum)
We will call you within two weeks about your referral to dermatology. If you do not hear within 2 weeks, give [place] a call.   Reduce losartan to 75 mg if your kidney doctor is ok with it  Please stop by lab before you go If you have mychart- we will send your results within 3 business days of [place] receiving them.  If you do not have mychart- we will call you about results within 5 business days of [place] receiving them.  *please also note that you will see labs on mychart as soon as they post. I will later go in and write notes on them- will say "notes from Dr. Durene Cal"   Recommended follow up: keep October visit

## 2020-12-16 LAB — CBC WITH DIFFERENTIAL/PLATELET
Basophils Absolute: 0 10*3/uL (ref 0.0–0.1)
Basophils Relative: 0.3 % (ref 0.0–3.0)
Eosinophils Absolute: 0 10*3/uL (ref 0.0–0.7)
Eosinophils Relative: 0.1 % (ref 0.0–5.0)
HCT: 40 % (ref 39.0–52.0)
Hemoglobin: 14.4 g/dL (ref 13.0–17.0)
Lymphocytes Relative: 50.4 % — ABNORMAL HIGH (ref 12.0–46.0)
Lymphs Abs: 2.3 10*3/uL (ref 0.7–4.0)
MCHC: 36 g/dL (ref 30.0–36.0)
MCV: 91.9 fl (ref 78.0–100.0)
Monocytes Absolute: 0.6 10*3/uL (ref 0.1–1.0)
Monocytes Relative: 13 % — ABNORMAL HIGH (ref 3.0–12.0)
Neutro Abs: 1.7 10*3/uL (ref 1.4–7.7)
Neutrophils Relative %: 36.2 % — ABNORMAL LOW (ref 43.0–77.0)
Platelets: 251 10*3/uL (ref 150.0–400.0)
RBC: 4.35 Mil/uL (ref 4.22–5.81)
RDW: 12.2 % (ref 11.5–15.5)
WBC: 4.6 10*3/uL (ref 4.0–10.5)

## 2020-12-16 LAB — COMPREHENSIVE METABOLIC PANEL
ALT: 27 U/L (ref 0–53)
AST: 26 U/L (ref 0–37)
Albumin: 4.8 g/dL (ref 3.5–5.2)
Alkaline Phosphatase: 51 U/L (ref 39–117)
BUN: 13 mg/dL (ref 6–23)
CO2: 31 mEq/L (ref 19–32)
Calcium: 10.1 mg/dL (ref 8.4–10.5)
Chloride: 102 mEq/L (ref 96–112)
Creatinine, Ser: 1.22 mg/dL (ref 0.40–1.50)
GFR: 82.63 mL/min (ref >60.00)
Glucose, Bld: 86 mg/dL (ref 70–99)
Potassium: 4 mEq/L (ref 3.5–5.1)
Sodium: 142 mEq/L (ref 135–145)
Total Bilirubin: 0.6 mg/dL (ref 0.2–1.2)
Total Protein: 7.1 g/dL (ref 6.0–8.3)

## 2020-12-19 ENCOUNTER — Telehealth: Payer: Self-pay

## 2020-12-19 NOTE — Telephone Encounter (Signed)
No abnormalities on his lab tests from last week.  James Matthews. [name], MD 12/19/2020 10:50 AM

## 2020-12-19 NOTE — Telephone Encounter (Signed)
The relative indices on his CBC with differential are not significant. His WBC is normal as are all of his absolute values.  Katina Degree. [name], MD 12/19/2020 1:10 PM

## 2020-12-19 NOTE — Telephone Encounter (Signed)
Patient is requesting a call back by the end of the day regarding labs

## 2020-12-19 NOTE — Telephone Encounter (Signed)
Notified patient of lab results. Neutrophils Relative % 43.0 - 77.0 % 36.2 Low       Lymphocytes Relative 12.0 - 46.0 % 50.4 High       Monocytes Relative 3.0 - 12.0 % 13.0 High         Patient still has concerns about lab results informed Dr.Parker did not note any concerns.

## 2020-12-19 NOTE — Telephone Encounter (Signed)
Notified Patient of labs results,

## 2020-12-19 NOTE — Telephone Encounter (Signed)
Called and spoke with pt mom and made them aware that James Matthews is not in office and I have sent a message to one of the other providers to see if he will interpret results for pt and I would be back in contact with pt once I have a response, pt and pt mom voiced understanding.

## 2020-12-19 NOTE — Telephone Encounter (Signed)
Patient is calling in stating he seen his lab results were back but there was no result message. Advised Dr.Hunter is out all week but patient is concerned and would like to know if there is someone who can result them.

## 2020-12-19 NOTE — Telephone Encounter (Signed)
Left message for patient to call clinic

## 2020-12-19 NOTE — Telephone Encounter (Signed)
Can you look at pt results and see if there is anything to be concerned about since Dr. Durene Cal is out?

## 2020-12-21 ENCOUNTER — Ambulatory Visit: Payer: BC Managed Care – PPO | Admitting: Internal Medicine

## 2020-12-21 ENCOUNTER — Other Ambulatory Visit: Payer: Self-pay

## 2020-12-21 DIAGNOSIS — F4312 Post-traumatic stress disorder, chronic: Secondary | ICD-10-CM | POA: Diagnosis not present

## 2020-12-21 MED ORDER — HYDROXYZINE HCL 25 MG PO TABS: ORAL_TABLET | ORAL | 1 refills | Status: DC

## 2020-12-21 NOTE — Progress Notes (Deleted)
Cardiology Office Note:    Date:  12/21/2020   ID:  James Matthews, DOB Feb 22, 1996, MRN [id number]  PCP:  Shelva Majestic, MD  [facility] Cardiologist:  Riley Lam MD [facility] Electrophysiologist:  None   CC: Syncope follow up  Orthostatics for possible BB/ ivabradine start ***  History of Present Illness:    James Matthews is a 25 y.o. male with a hx of HTN, CKD Stage II, eosinophilic moderate persistent asthma, Anxiety, Depression, and PTSD  who presents for syncope evaluation 05/27/2020.  After worsening symptoms 09/02/20, Echo and ZioPatch were done to evaluate cardiac cause of discomfort.  In interim of this visit, patient had normal echo and ZioPatch and was referred to [facility]; patient preferred to see [facility].  Provider note:  Has a mild verbal apraxia  Patient notes that he is doing ***.  Since day prior/last visit notes *** changes.  Relevant interval testing or therapy include ***.  There are no*** interval hospital/ED visit.    No chest pain or pressure ***.  No SOB/DOE*** and no PND/Orthopnea***.  No weight gain or leg swelling***.  No palpitations or syncope ***.  Ambulatory blood pressure ***.    Past Medical History:  Diagnosis Date   Asthma    Hypertension    IBS (irritable bowel syndrome)    probiotic   Orthostatic hypertension    Posterior urethral valves    Premature birth    3 months early   Renal disorder    Verbal apraxia     Past Surgical History:  Procedure Laterality Date   ADENOIDECTOMY     ESOPHAGEAL MANOMETRY N/A 01/23/2016   Procedure: ESOPHAGEAL MANOMETRY (EM);  Surgeon: Charna Elizabeth, MD;  Location: [facility];  Service: Endoscopy;  Laterality: N/A;   LASIK     eye surgery july 2017   NEPHRECTOMY     rght    TONSILLECTOMY      Current Medications: No outpatient medications have been marked as taking for the 12/21/20 encounter (Appointment) with Christell Constant, MD.      Allergies:   Albuterol, Chocolate, Lisinopril, and Other   Social History   Socioeconomic History   Marital status: Single    Spouse name: Not on file   Number of children: Not on file   Years of education: Not on file   Highest education level: Not on file  Occupational History   Not on file  Tobacco Use   Smoking status: Never   Smokeless tobacco: Never  Vaping Use   Vaping Use: Not on file  Substance and Sexual Activity   Alcohol use: No   Drug use: No   Sexual activity: Not on file  Other Topics Concern   Not on file  Social History Narrative   Single. Lives with mom and dad. His brother and wife and nephew also live with him. Brother and sister- healthy.       Full time at [employer]. Studying psychology with biology minor.    Thinking grad school for psychology- would plan for research over clinical   Right handed   Two story home   Does drink caffeine, one large coffee daily   Social Determinants of Health   Financial Resource Strain: Not on file  Food Insecurity: Not on file  Transportation Needs: Not on file  Physical Activity: Not on file  Stress: Not on file  Social Connections: Not on file    Family History: The patient's family history  includes Breast cancer in his mother; Heart disease in his maternal grandfather; Hyperlipidemia in his mother; Hypertension in his mother; Melanoma in his sister; Other in his father; Ovarian cancer in his maternal aunt; Prostate cancer in his maternal grandfather and paternal grandfather. Grandfather had Head Disease NOS; died at age 83; heart attacks in his 14s Aunt has had coronary artery disease No history of SCD, drownings or accidents. Brother has mitral valve proplapse  ROS:   Please see the history of present illness.    All other systems reviewed and are negative.  EKGs/Labs/Other Studies Reviewed:    The following studies were reviewed today:  EKG:   05/09/20  SR 88, LVH with  repolarization  Recent Labs: 11/23/2020: TSH 2.57 12/15/2020: ALT 27; BUN 13; Creatinine, Ser 1.22; Hemoglobin 14.4; Platelets 251.0; Potassium 4.0; Sodium 142  Recent Lipid Panel    Component Value Date/Time   CHOL 119 05/25/2019 1410   TRIG 95.0 05/25/2019 1410   HDL 36.90 (L) 05/25/2019 1410   CHOLHDL 3 05/25/2019 1410   VLDL 19.0 05/25/2019 1410   LDLCALC 64 05/25/2019 1410    Risk Assessment/Calculations:     None  Physical Exam:    VS:  There were no vitals taken for this visit.     Wt Readings from Last 3 Encounters:  12/15/20 74.2 kg  12/01/20 74.6 kg  11/23/20 75.5 kg    GEN: Well nourished, well developed in no acute distress HEENT: Normal NECK: No JVD; No carotid bruits LYMPHATICS: No lymphadenopathy *** CARDIAC: RRR, no murmurs, rubs, gallops RESPIRATORY:  Clear to auscultation without rales, wheezing or rhonchi  ABDOMEN: Soft, non-tender, non-distended MUSCULOSKELETAL:  No edema; No deformity  SKIN: Warm and dry NEUROLOGIC:  Alert and oriented x 3 PSYCHIATRIC:  Normal affect   ASSESSMENT:    No diagnosis found.  PLAN:     Orthostatic Hypotension With Hypertension and CK Stage II (prior kidney loss) -  - symptomatic - stressed the importance of salt and water intake - gave education on slow rise, Valsalva maneuver exacerbation, temperature change - discussed muscle contraction and leg crossing - offered Levine Protocol for improvement in Dysautonomia - offered compression stockings and abdominal binders   Medication Adjustments/Labs and Tests Ordered: Current medicines are reviewed at length with the patient today.  Concerns regarding medicines are outlined above.  No orders of the defined types were placed in this encounter.  No orders of the defined types were placed in this encounter.   There are no Patient Instructions on file for this visit.   Signed, Christell Constant, MD  12/21/2020 8:10 AM    [facility]

## 2020-12-22 ENCOUNTER — Encounter: Payer: Self-pay | Admitting: Family Medicine

## 2020-12-22 DIAGNOSIS — J4550 Severe persistent asthma, uncomplicated: Secondary | ICD-10-CM | POA: Diagnosis not present

## 2020-12-22 NOTE — Telephone Encounter (Signed)
100% agree with Dr. [name] on this. If the WBC is normal and the absolute #s are normal then the relative percentages are not significantly important. I will interpret on the labs soon as well and send message to him

## 2020-12-22 NOTE — Telephone Encounter (Signed)
Unable to reach patient, LMTRC  

## 2020-12-23 ENCOUNTER — Other Ambulatory Visit: Payer: Self-pay | Admitting: Allergy

## 2020-12-23 DIAGNOSIS — F4312 Post-traumatic stress disorder, chronic: Secondary | ICD-10-CM | POA: Diagnosis not present

## 2020-12-26 ENCOUNTER — Other Ambulatory Visit: Payer: Self-pay

## 2020-12-26 ENCOUNTER — Ambulatory Visit (INDEPENDENT_AMBULATORY_CARE_PROVIDER_SITE_OTHER): Payer: BC Managed Care – PPO

## 2020-12-26 DIAGNOSIS — J4550 Severe persistent asthma, uncomplicated: Secondary | ICD-10-CM | POA: Diagnosis not present

## 2020-12-26 DIAGNOSIS — N3000 Acute cystitis without hematuria: Secondary | ICD-10-CM | POA: Diagnosis not present

## 2020-12-26 MED ORDER — TEZEPELUMAB-EKKO 210 MG/1.91ML ~~LOC~~ SOSY
210.0000 mg | PREFILLED_SYRINGE | SUBCUTANEOUS | Status: AC
  Administered 2020-12-26 – 2023-02-26 (×26): 210 mg via SUBCUTANEOUS

## 2020-12-26 NOTE — Progress Notes (Signed)
Immunotherapy   Patient Details  Name: James Matthews MRN: [id number] Date of Birth: [birth year]  12/26/2020  James Matthews here to start Tezspire for asthma. Patient waited in office for 30 minutes with no problems. Frequency: every 4 weeks Epi-Pen:Epi-Pen Available  Consent signed and patient instructions given.   Dub Mikes 12/26/2020, 3:19 PM

## 2020-12-27 DIAGNOSIS — F4312 Post-traumatic stress disorder, chronic: Secondary | ICD-10-CM | POA: Diagnosis not present

## 2021-01-03 DIAGNOSIS — F4312 Post-traumatic stress disorder, chronic: Secondary | ICD-10-CM | POA: Diagnosis not present

## 2021-01-10 DIAGNOSIS — F4312 Post-traumatic stress disorder, chronic: Secondary | ICD-10-CM | POA: Diagnosis not present

## 2021-01-17 DIAGNOSIS — F4312 Post-traumatic stress disorder, chronic: Secondary | ICD-10-CM | POA: Diagnosis not present

## 2021-01-18 DIAGNOSIS — J4550 Severe persistent asthma, uncomplicated: Secondary | ICD-10-CM | POA: Diagnosis not present

## 2021-01-19 DIAGNOSIS — N182 Chronic kidney disease, stage 2 (mild): Secondary | ICD-10-CM | POA: Diagnosis not present

## 2021-01-23 ENCOUNTER — Ambulatory Visit (INDEPENDENT_AMBULATORY_CARE_PROVIDER_SITE_OTHER): Payer: BC Managed Care – PPO | Admitting: *Deleted

## 2021-01-23 ENCOUNTER — Other Ambulatory Visit: Payer: Self-pay

## 2021-01-23 DIAGNOSIS — J4550 Severe persistent asthma, uncomplicated: Secondary | ICD-10-CM

## 2021-01-30 ENCOUNTER — Other Ambulatory Visit: Payer: Self-pay | Admitting: Neurology

## 2021-02-09 DIAGNOSIS — F4312 Post-traumatic stress disorder, chronic: Secondary | ICD-10-CM | POA: Diagnosis not present

## 2021-02-14 DIAGNOSIS — F4312 Post-traumatic stress disorder, chronic: Secondary | ICD-10-CM | POA: Diagnosis not present

## 2021-02-15 DIAGNOSIS — J4550 Severe persistent asthma, uncomplicated: Secondary | ICD-10-CM | POA: Diagnosis not present

## 2021-02-20 ENCOUNTER — Ambulatory Visit (INDEPENDENT_AMBULATORY_CARE_PROVIDER_SITE_OTHER): Payer: BC Managed Care – PPO

## 2021-02-20 ENCOUNTER — Other Ambulatory Visit: Payer: Self-pay

## 2021-02-20 DIAGNOSIS — J4550 Severe persistent asthma, uncomplicated: Secondary | ICD-10-CM | POA: Diagnosis not present

## 2021-02-21 DIAGNOSIS — F4312 Post-traumatic stress disorder, chronic: Secondary | ICD-10-CM | POA: Diagnosis not present

## 2021-02-23 ENCOUNTER — Encounter: Payer: Self-pay | Admitting: Allergy

## 2021-02-23 ENCOUNTER — Ambulatory Visit: Payer: BC Managed Care – PPO | Admitting: Allergy

## 2021-02-23 ENCOUNTER — Other Ambulatory Visit: Payer: Self-pay

## 2021-02-23 VITALS — BP 126/74 | HR 98 | Temp 97.8°F | Resp 18 | Ht 72.0 in | Wt 160.4 lb

## 2021-02-23 DIAGNOSIS — H1013 Acute atopic conjunctivitis, bilateral: Secondary | ICD-10-CM

## 2021-02-23 DIAGNOSIS — J4550 Severe persistent asthma, uncomplicated: Secondary | ICD-10-CM

## 2021-02-23 DIAGNOSIS — J3089 Other allergic rhinitis: Secondary | ICD-10-CM

## 2021-02-23 DIAGNOSIS — T7800XA Anaphylactic reaction due to unspecified food, initial encounter: Secondary | ICD-10-CM

## 2021-02-23 MED ORDER — FLUTICASONE PROPIONATE 50 MCG/ACT NA SUSP: 1.0000 | Freq: Every day | NASAL | 2 refills | Status: DC | PRN

## 2021-02-23 MED ORDER — OLOPATADINE HCL 0.1 % OP SOLN: 1.0000 [drp] | Freq: Every day | OPHTHALMIC | 12 refills | Status: DC | PRN

## 2021-02-23 NOTE — Progress Notes (Signed)
Follow-up Note  RE: James Matthews MRN: [id number] DOB: 1996-04-05 Date of Office Visit: 02/23/2021   History of present illness: James Matthews is a 25 y.o. male presenting today for follow-up of severe asthma, allergic rhinitis and food allergy.  He was last seen in the office on 12/01/20 by myself.  Since last visit he was changed from [place] to [place] and he has had 2 doses thus far.  Tolerating injections well.  He does feel this change has been helpful as he feels a lot better and his asthma has been beter controlled.  He states he has been able to get back into the gym which is important for him.  He has not use xopenex in the past 4 weeks.  He also is not taking Alvesco at all as he states quite frankly he just forgot.  Thus at this time on Tezspire as control.  He also states since not using alvesco daily he has not had issues with thrush.  He has not required any systemic steroids.  With his allergies he states the zyrtec is not effective.  He primarily uses hydroxyzine as antihistamine for allergy symptom control and it also helps with his sleep/insomnia.   He continues to avoid tree nuts with accidental ingestion or epinephrine use.   Review of systems in past 4 weeks: Review of Systems  Constitutional: Negative.   HENT: Negative.    Eyes: Negative.   Respiratory: Negative.    Cardiovascular: Negative.   Gastrointestinal: Negative.   Musculoskeletal: Negative.   Skin: Negative.   Neurological: Negative.    All other systems negative unless noted above in HPI  Past medical/social/surgical/family history have been reviewed and are unchanged unless specifically indicated below.  No changes  Medication List: Current Outpatient Medications  Medication Sig Dispense Refill   AIMOVIG 140 MG/ML SOAJ INJECT INTO THE SKIN EVERY 30 DAYS 1 mL 2   ALPRAZolam (XANAX) 1 MG tablet Take 1 mg by mouth 2 (two) times daily as needed for anxiety.      busPIRone (BUSPAR)  30 MG tablet TAKE 1 TABLET BY MOUTH TWICE A DAY 180 tablet 1   desvenlafaxine (PRISTIQ) 100 MG 24 hr tablet Take 200 mg by mouth daily.     EPINEPHRINE 0.3 mg/0.3 mL IJ SOAJ injection INJECT 0.3 MLS INTO THE MUSCLE ONCE FOR 1 DOSE. AS DIRECTED FOR LIFE-THREATENING ALLERGIC REACTIONS 2 each 1   fluticasone (FLONASE) 50 MCG/ACT nasal spray Place 1 spray into both nostrils daily as needed for allergies or rhinitis. 9.9 mL 2   hydrOXYzine (ATARAX/VISTARIL) 25 MG tablet Take one 25 mg tablet 30-60 minutes prior to bedtime for insomnia, anxiety. 90 tablet 1   levalbuterol (XOPENEX HFA) 45 MCG/ACT inhaler Inhale 2 puffs into the lungs every 4 (four) hours as needed for wheezing. 15 g 1   losartan (COZAAR) 50 MG tablet Take 1.5 tablets (75 mg total) by mouth daily. (Patient taking differently: Take 25 mg by mouth daily.) 45 tablet 5   olopatadine (PATADAY) 0.1 % ophthalmic solution Place 1 drop into both eyes daily as needed for allergies. 5 mL 12   aluminum chloride (DRYSOL) 20 % external solution Apply topically at bedtime. once excessive sweating has stopped, may decrease to once or twice weekly, or as needed. Wash treated area in the morning. (Patient not taking: Reported on 02/23/2021) 60 mL 5   ciclesonide (ALVESCO) 160 MCG/ACT inhaler Inhale 2 puffs into the lungs 2 (two) times daily. (Patient not taking:  Reported on 02/23/2021) 1 each 5   ketoconazole (NIZORAL) 2 % cream Apply 1 application topically daily. For athlete's foot (Patient not taking: Reported on 02/23/2021) 60 g 1   levalbuterol (XOPENEX) 1.25 MG/3ML nebulizer solution 1 vial via nebulizer every 4-6 hours as needed. (Patient not taking: Reported on 02/23/2021) 75 mL 1   nystatin (MYCOSTATIN) 100000 UNIT/ML suspension Take 20ml-10ml by mouth, swish and spit, 4 times daily for a week. (Patient not taking: Reported on 02/23/2021) 60 mL 0   Olodaterol HCl (STRIVERDI RESPIMAT) 2.5 MCG/ACT AERS Inhale 2 puffs into the lungs daily. Take with Alvesco  (Patient not taking: Reported on 02/23/2021) 4 g 5   ondansetron (ZOFRAN-ODT) 8 MG disintegrating tablet Take 1 tablet (8 mg total) by mouth every 8 (eight) hours as needed for nausea or vomiting. (Patient not taking: Reported on 02/23/2021) 20 tablet 3   rizatriptan (MAXALT) 10 MG tablet Take 1 tablet earliest onset of migraine.  May repeat in 2 hours if needed.  Maximum 2 tablets in 24 hours (Patient not taking: Reported on 02/23/2021) 10 tablet 5   Current Facility-Administered Medications  Medication Dose Route Frequency Provider Last Rate Last Admin   tezepelumab-ekko (TEZSPIRE) 210 MG/1. syringe 210 mg  210 mg Subcutaneous Q28 days Marcelyn Bruins, MD   210 mg at 02/20/21 1330     Known medication allergies: Allergies  Allergen Reactions   Albuterol     High HR   Chocolate     Face swelling/sounds like angioedema "I had to go to urgent care today due to an allergic reaction to a chocolate candy bar. My throat swelled up and was closing, my nasal passages were congested, and my face was hurting. They gave me a steroid shot at the clinic, and then they sent me home presidone to take for a week" 06/2019   Lisinopril Other (See Comments)   Other Other (See Comments)    Pain med? Unsure of medication, caused n/v     Physical examination: Blood pressure 126/74, pulse 98, temperature 97.8 F (36.6 C), resp. rate 18, height 6' (1.829 m), weight 160 lb 6.4 oz (72.8 kg).  General: Alert, interactive, in no acute distress. HEENT: PERRLA, TMs pearly gray, turbinates non-edematous without discharge, post-pharynx non erythematous. Neck: Supple without lymphadenopathy. Lungs: Clear to auscultation without wheezing, rhonchi or rales. {no increased work of breathing. CV: Normal S1, S2 without murmurs. Abdomen: Nondistended, nontender. Skin: Warm and dry, without lesions or rashes. Extremities:  No clubbing, cyanosis or edema. Neuro:   Grossly  intact.  Diagnositics/Labs:  Spirometry: FEV1: 5.71 L 124%, FVC: 6.4 L 115%, ratio consistent with nonobstructive pattern    Assessment and plan:   Asthma  - have access to levalbuterol inhaler 2 puffs or Xopenex 1 vial via nebulizer every 4-6 hours as needed for cough/wheeze/shortness of breath/chest tightness.  May use 15-20 minutes prior to activity.   Monitor frequency of use.   -Asthma action plan (if having respiratory illness or asthma flare) initiate: Alvesco  4 puffs twice a day spacer device.    This is a pro-drug thus it is not active until it reaches lung tissue thus should reduce your risk for thrush.  - Continue Tezpire monthly injections.   -if needed consider pulmonary referral for pulmonary rehab if above regimen not effective enough in controlling asthma symptoms -good news is lung function looks great today!  Asthma control goals:  Full participation in all desired activities (may need albuterol before activity) Albuterol use  two time or less a week on average (not counting use with activity) Cough interfering with sleep two time or less a month Oral steroids no more than once a year No hospitalizations  Allergies - continue avoidance measures for grass pollens, weed pollens, tree pollens, molds, dust mite, cat, mouse, mixed feathers - can use Allegra 180mg  or Xyzal 5mg  daily as needed even with hydroxyzine if needed   - hydroxyzine 25-50mg  nightly as needed - for watery eyes can use of Pataday 1 drop each eye daily as needed - for nasal congestion use of nasal steroid spray like Flonase no more than 3 times a week due to nosebleeds.    Allergic reaction  - continue avoidance of tree nuts  - have access to self-injectable epinephrine AuviQ 0.3mg  at all times  - follow emergency action plan in case of allergic reaction  Follow-up in 4-6 months or sooner if needed  I appreciate the opportunity to take part in James Matthews's care. Please do not hesitate to  contact me with questions.  Sincerely,   , MD Allergy/Immunology [facility]

## 2021-02-23 NOTE — Patient Instructions (Signed)
Asthma  - have access to levalbuterol inhaler 2 puffs or Xopenex 1 vial via nebulizer every 4-6 hours as needed for cough/wheeze/shortness of breath/chest tightness.  May use 15-20 minutes prior to activity.   Monitor frequency of use.   -Asthma action plan (if having respiratory illness or asthma flare) initiate: Alvesco  4 puffs twice a day spacer device.    This is a pro-drug thus it is not active until it reaches lung tissue thus should reduce your risk for thrush.  - Continue Tezpire monthly injections.   -if needed consider pulmonary referral for pulmonary rehab if above regimen not effective enough in controlling asthma symptoms -good news is lung function looks great today!  Asthma control goals:  Full participation in all desired activities (may need albuterol before activity) Albuterol use two time or less a week on average (not counting use with activity) Cough interfering with sleep two time or less a month Oral steroids no more than once a year No hospitalizations  Allergies - continue avoidance measures for grass pollens, weed pollens, tree pollens, molds, dust mite, cat, mouse, mixed feathers - can use Allegra 180mg  or Xyzal 5mg  daily as needed even with hydroxyzine if needed   - hydroxyzine 25-50mg  nightly - for watery eyes can use of Pataday 1 drop each eye daily as needed - for nasal congestion use of nasal steroid spray like Flonase no more than 3 times a week due to nosebleeds.    Allergic reaction  - continue avoidance of tree nuts  - have access to self-injectable epinephrine AuviQ 0.3mg  at all times  - follow emergency action plan in case of allergic reaction  Follow-up in 4-6 months or sooner if needed

## 2021-02-25 MED ORDER — HYDROXYZINE HCL 25 MG PO TABS: ORAL_TABLET | ORAL | 2 refills | Status: DC

## 2021-02-28 DIAGNOSIS — F4312 Post-traumatic stress disorder, chronic: Secondary | ICD-10-CM | POA: Diagnosis not present

## 2021-03-06 DIAGNOSIS — F330 Major depressive disorder, recurrent, mild: Secondary | ICD-10-CM | POA: Diagnosis not present

## 2021-03-06 DIAGNOSIS — F5082 Avoidant/restrictive food intake disorder: Secondary | ICD-10-CM | POA: Diagnosis not present

## 2021-03-06 DIAGNOSIS — R482 Apraxia: Secondary | ICD-10-CM | POA: Diagnosis not present

## 2021-03-06 DIAGNOSIS — F411 Generalized anxiety disorder: Secondary | ICD-10-CM | POA: Diagnosis not present

## 2021-03-08 DIAGNOSIS — F4312 Post-traumatic stress disorder, chronic: Secondary | ICD-10-CM | POA: Diagnosis not present

## 2021-03-14 DIAGNOSIS — F4312 Post-traumatic stress disorder, chronic: Secondary | ICD-10-CM | POA: Diagnosis not present

## 2021-03-15 DIAGNOSIS — J4550 Severe persistent asthma, uncomplicated: Secondary | ICD-10-CM | POA: Diagnosis not present

## 2021-03-20 ENCOUNTER — Ambulatory Visit: Payer: BC Managed Care – PPO

## 2021-03-22 ENCOUNTER — Other Ambulatory Visit: Payer: Self-pay

## 2021-03-22 ENCOUNTER — Ambulatory Visit (INDEPENDENT_AMBULATORY_CARE_PROVIDER_SITE_OTHER): Payer: BC Managed Care – PPO | Admitting: Allergy

## 2021-03-22 DIAGNOSIS — J4550 Severe persistent asthma, uncomplicated: Secondary | ICD-10-CM | POA: Diagnosis not present

## 2021-03-28 DIAGNOSIS — F4312 Post-traumatic stress disorder, chronic: Secondary | ICD-10-CM | POA: Diagnosis not present

## 2021-04-05 ENCOUNTER — Other Ambulatory Visit: Payer: Self-pay | Admitting: Allergy

## 2021-04-05 DIAGNOSIS — F4312 Post-traumatic stress disorder, chronic: Secondary | ICD-10-CM | POA: Diagnosis not present

## 2021-04-06 DIAGNOSIS — Z23 Encounter for immunization: Secondary | ICD-10-CM | POA: Diagnosis not present

## 2021-04-06 DIAGNOSIS — Z0000 Encounter for general adult medical examination without abnormal findings: Secondary | ICD-10-CM | POA: Diagnosis not present

## 2021-04-06 DIAGNOSIS — N182 Chronic kidney disease, stage 2 (mild): Secondary | ICD-10-CM | POA: Diagnosis not present

## 2021-04-06 DIAGNOSIS — Z13220 Encounter for screening for lipoid disorders: Secondary | ICD-10-CM | POA: Diagnosis not present

## 2021-04-09 NOTE — Progress Notes (Signed)
NEUROLOGY FOLLOW UP OFFICE NOTE  James Matthews [id number]  Assessment/Plan:   Migraine without aura, without status migrainosus, not intractable Orthostatic hypotension - medication-induced, now resolved.  Migraine prevention:  Aimovig 140mg  every 28 days Migraine rescue:  Tylenol (may use rizatriptan 10mg  if needed), Zofran Limit use of pain relievers to no more than 2 days out of week to prevent risk of rebound or medication-overuse headache. Keep headache diary Follow up 9 months   Subjective:  [name] is a 25 year old male with HTN, CKD and asthma who follows up for migraines.James Matthews  He is accompanied by his mother who supplements history.   UPDATE: Migraines are stable. Intensity:  Moderate to severe Duration:  1 hour with acetaminophen Frequency:  2 in past 30 days   Patient has history of syncope and orthostatic hypotension.  There is associated confusion and blurred vision.  Also reports profuse sweating.  Evaluated by cardiology.  Echo and 14 day event monitor unremarkable.  his nephrologist took him off losartan and symptoms resolved.   Current NSAIDS:  none Current analgesics:  Tylenol  Current triptans:  rizatriptan 10mg  (has not needed - treating with Tylenol instead).   Current ergotamine:  none Current anti-emetic:  Zofran ODT 8mg  Current muscle relaxants:  none Current anti-anxiolytic:  Clonazepam; busipirone Current sleep aide:  none Current Antihypertensive medications:  none Current Antidepressant medications:  Pristiq Current Anticonvulsant medications:  none Current anti-CGRP:  Aimovig 140mg  Current Vitamins/Herbal/Supplements:  none Current Antihistamines/Decongestants:  Astelin Other therapy:  none Hormone/birth control:  none   Caffeine:  1 cup coffee daily. Diet:  2 liters water daily.  No soda. Exercise:  Once a week Depression:  Yes but stable; Anxiety:  Yes but stable Other pain:  no Sleep hygiene:  8 hours of sleep a night.   Previously poor   HISTORY:  He has history of migraines since childhood but have gotten worse about 2 months ago.  He has no explanation for increased frequency.  They are severe right worse than left parietal pressure-like headaches. They are associated with nausea, visual disturbance (red dots) photophobia and phonophobia.  They last from several hours to several days.  They have been occurring daily (previously occurring 3 to 4 times a week).  Triggers include light and sound.  Due to his speech disorder, prolonged talking may be a trigger.  Laying down to rest in dark and quiet room helps.  Prednisone taper was ineffective after it was finished.  MRI of brain with and without contrast on 08/06/2019 was normal.   Remote CT head without contrast from 02/19/2007 to evaluate headache was personally reviewed and demonstrated incidental findings consistent with right ethmoid sinusitis but no acute intracranial abnormality.   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  No known cause but possibly genetic.  He was born 3 months premature.   Past NSAIDS/steroids:  Prednisone. Unable to take NSAIDs due to renal disorder. Past analgesics:  none Past abortive triptans:  sumatriptan (ineffective) Past abortive ergotamine:  none Past muscle relaxants:  none Past anti-emetic:  Zofran ODT 4mg  Past antihypertensive medications:  lisinopril Past antidepressant/antipsychotic/mood medications:  Fluoxetine, mirtazepine, Seroquel, trazodone Past anticonvulsant medications:  Gabapentin 600mg  Past anti-CGRP:  nonw Past vitamins/Herbal/Supplements:  none Past antihistamines/decongestants:  none Other past therapies:  none     Family history of headache:  Mom (migraines); brother (migraines); sister (migraines)  PAST MEDICAL HISTORY: Past Medical History:  Diagnosis Date   Asthma    Hypertension  IBS (irritable bowel syndrome)    probiotic   Orthostatic hypertension    Posterior urethral valves     Premature birth    3 months early   Renal disorder    Verbal apraxia     MEDICATIONS: Current Outpatient Medications on File Prior to Visit  Medication Sig Dispense Refill   AIMOVIG 140 MG/ML SOAJ INJECT INTO THE SKIN EVERY 30 DAYS 1 mL 2   ALPRAZolam (XANAX) 1 MG tablet Take 1 mg by mouth 2 (two) times daily as needed for anxiety.      aluminum chloride (DRYSOL) 20 % external solution Apply topically at bedtime. once excessive sweating has stopped, may decrease to once or twice weekly, or as needed. Wash treated area in the morning. (Patient not taking: Reported on 02/23/2021) 60 mL 5   busPIRone (BUSPAR) 30 MG tablet TAKE 1 TABLET BY MOUTH TWICE A DAY 180 tablet 1   ciclesonide (ALVESCO) 160 MCG/ACT inhaler Inhale 2 puffs into the lungs 2 (two) times daily. (Patient not taking: Reported on 02/23/2021) 1 each 5   desvenlafaxine (PRISTIQ) 100 MG 24 hr tablet Take 200 mg by mouth daily.     EPINEPHRINE 0.3 mg/0.3 mL IJ SOAJ injection INJECT 0.3 MLS INTO THE MUSCLE ONCE FOR 1 DOSE. AS DIRECTED FOR LIFE-THREATENING ALLERGIC REACTIONS 2 each 1   fluticasone (FLONASE) 50 MCG/ACT nasal spray Place 1 spray into both nostrils daily as needed for allergies or rhinitis. 9.9 mL 2   hydrOXYzine (ATARAX/VISTARIL) 25 MG tablet TAKE 1-2 TABLETS 30-60 MINUTES PRIOR TO BEDTIME FOR INSOMNIA, ANXIETY. 180 tablet 2   ketoconazole (NIZORAL) 2 % cream Apply 1 application topically daily. For athlete's foot (Patient not taking: Reported on 02/23/2021) 60 g 1   levalbuterol (XOPENEX HFA) 45 MCG/ACT inhaler Inhale 2 puffs into the lungs every 4 (four) hours as needed for wheezing. 15 g 1   levalbuterol (XOPENEX) 1.25 MG/3ML nebulizer solution 1 vial via nebulizer every 4-6 hours as needed. (Patient not taking: Reported on 02/23/2021) 75 mL 1   losartan (COZAAR) 50 MG tablet Take 1.5 tablets (75 mg total) by mouth daily. (Patient taking differently: Take 25 mg by mouth daily.) 45 tablet 5   nystatin (MYCOSTATIN) 100000  UNIT/ML suspension Take 60ml-10ml by mouth, swish and spit, 4 times daily for a week. (Patient not taking: Reported on 02/23/2021) 60 mL 0   Olodaterol HCl (STRIVERDI RESPIMAT) 2.5 MCG/ACT AERS Inhale 2 puffs into the lungs daily. Take with Alvesco (Patient not taking: Reported on 02/23/2021) 4 g 5   olopatadine (PATADAY) 0.1 % ophthalmic solution Place 1 drop into both eyes daily as needed for allergies. 5 mL 12   ondansetron (ZOFRAN-ODT) 8 MG disintegrating tablet Take 1 tablet (8 mg total) by mouth every 8 (eight) hours as needed for nausea or vomiting. (Patient not taking: Reported on 02/23/2021) 20 tablet 3   rizatriptan (MAXALT) 10 MG tablet Take 1 tablet earliest onset of migraine.  May repeat in 2 hours if needed.  Maximum 2 tablets in 24 hours (Patient not taking: Reported on 02/23/2021) 10 tablet 5   Current Facility-Administered Medications on File Prior to Visit  Medication Dose Route Frequency Provider Last Rate Last Admin   tezepelumab-ekko (TEZSPIRE) 210 MG/1. syringe 210 mg  210 mg Subcutaneous Q28 days Marcelyn Bruins, MD   210 mg at 03/22/21 1320    ALLERGIES: Allergies  Allergen Reactions   Albuterol     High HR   Chocolate     Face  swelling/sounds like angioedema "I had to go to urgent care today due to an allergic reaction to a chocolate candy bar. My throat swelled up and was closing, my nasal passages were congested, and my face was hurting. They gave me a steroid shot at the clinic, and then they sent me home presidone to take for a week" 06/2019   Lisinopril Other (See Comments)   Other Other (See Comments)    Pain med? Unsure of medication, caused n/v    FAMILY HISTORY: Family History  Problem Relation Age of Onset   Breast cancer Mother        survivor   Hypertension Mother    Hyperlipidemia Mother    Other Father        pituitary tumor- gamma knife radiation   Heart disease Maternal Grandfather    Prostate cancer Maternal Grandfather        70s    Melanoma Sister    Prostate cancer Paternal Grandfather        16s   Ovarian cancer Maternal Aunt        mets to liver       Objective:  Blood pressure 108/73, pulse 78, height 6\' 1"  (1.854 m), weight 159 lb 12.8 oz (72.5 kg), SpO2 97 %. General: No acute distress.  Patient appears well-groomed.   Head:  Normocephalic/atraumatic Eyes:  Fundi examined but not visualized Neck: supple, no paraspinal tenderness, full range of motion Heart:  Regular rate and rhythm Lungs:  Clear to auscultation bilaterally Back: No paraspinal tenderness Neurological Exam: alert and oriented to person, place, and time.  Speech fluent and not dysarthric, language intact.  CN II-XII intact. Bulk and tone normal, muscle strength 5/5 throughout.  Sensation to light touch intact.  Deep tendon reflexes 2+ throughout, toes downgoing.  Finger to nose testing intact.  Gait normal, Romberg negative.   , DO  CC: Shon Millet, MD

## 2021-04-10 ENCOUNTER — Encounter: Payer: Self-pay | Admitting: Neurology

## 2021-04-10 ENCOUNTER — Ambulatory Visit: Payer: BC Managed Care – PPO | Admitting: Neurology

## 2021-04-10 ENCOUNTER — Other Ambulatory Visit: Payer: Self-pay

## 2021-04-10 VITALS — BP 108/73 | HR 78 | Ht 73.0 in | Wt 159.8 lb

## 2021-04-10 DIAGNOSIS — G43009 Migraine without aura, not intractable, without status migrainosus: Secondary | ICD-10-CM | POA: Diagnosis not present

## 2021-04-10 NOTE — Patient Instructions (Signed)
Continue Aimovig 140mg  every 28 days Use Tylenol (or rizatriptan) as needed

## 2021-04-11 DIAGNOSIS — F4312 Post-traumatic stress disorder, chronic: Secondary | ICD-10-CM | POA: Diagnosis not present

## 2021-04-14 ENCOUNTER — Ambulatory Visit: Payer: BC Managed Care – PPO | Admitting: Family Medicine

## 2021-04-17 DIAGNOSIS — J4550 Severe persistent asthma, uncomplicated: Secondary | ICD-10-CM | POA: Diagnosis not present

## 2021-04-18 DIAGNOSIS — F4312 Post-traumatic stress disorder, chronic: Secondary | ICD-10-CM | POA: Diagnosis not present

## 2021-04-19 ENCOUNTER — Ambulatory Visit: Payer: BC Managed Care – PPO

## 2021-04-25 ENCOUNTER — Ambulatory Visit (INDEPENDENT_AMBULATORY_CARE_PROVIDER_SITE_OTHER): Payer: BC Managed Care – PPO

## 2021-04-25 ENCOUNTER — Other Ambulatory Visit: Payer: Self-pay

## 2021-04-25 DIAGNOSIS — J4550 Severe persistent asthma, uncomplicated: Secondary | ICD-10-CM

## 2021-04-25 DIAGNOSIS — F4312 Post-traumatic stress disorder, chronic: Secondary | ICD-10-CM | POA: Diagnosis not present

## 2021-04-29 ENCOUNTER — Other Ambulatory Visit: Payer: Self-pay | Admitting: Neurology

## 2021-05-02 DIAGNOSIS — F4312 Post-traumatic stress disorder, chronic: Secondary | ICD-10-CM | POA: Diagnosis not present

## 2021-05-09 DIAGNOSIS — F4312 Post-traumatic stress disorder, chronic: Secondary | ICD-10-CM | POA: Diagnosis not present

## 2021-05-10 DIAGNOSIS — F4312 Post-traumatic stress disorder, chronic: Secondary | ICD-10-CM | POA: Diagnosis not present

## 2021-05-11 DIAGNOSIS — F413 Other mixed anxiety disorders: Secondary | ICD-10-CM | POA: Diagnosis not present

## 2021-05-16 DIAGNOSIS — F4312 Post-traumatic stress disorder, chronic: Secondary | ICD-10-CM | POA: Diagnosis not present

## 2021-05-17 DIAGNOSIS — J4550 Severe persistent asthma, uncomplicated: Secondary | ICD-10-CM | POA: Diagnosis not present

## 2021-05-19 ENCOUNTER — Other Ambulatory Visit: Payer: Self-pay | Admitting: Allergy

## 2021-05-19 DIAGNOSIS — F413 Other mixed anxiety disorders: Secondary | ICD-10-CM | POA: Diagnosis not present

## 2021-05-23 ENCOUNTER — Ambulatory Visit: Payer: BC Managed Care – PPO

## 2021-05-23 DIAGNOSIS — F4312 Post-traumatic stress disorder, chronic: Secondary | ICD-10-CM | POA: Diagnosis not present

## 2021-05-29 ENCOUNTER — Other Ambulatory Visit: Payer: Self-pay | Admitting: Neurology

## 2021-05-30 DIAGNOSIS — F411 Generalized anxiety disorder: Secondary | ICD-10-CM | POA: Diagnosis not present

## 2021-05-30 DIAGNOSIS — F4312 Post-traumatic stress disorder, chronic: Secondary | ICD-10-CM | POA: Diagnosis not present

## 2021-06-06 DIAGNOSIS — F4310 Post-traumatic stress disorder, unspecified: Secondary | ICD-10-CM | POA: Diagnosis not present

## 2021-06-06 DIAGNOSIS — F5101 Primary insomnia: Secondary | ICD-10-CM | POA: Diagnosis not present

## 2021-06-06 DIAGNOSIS — F413 Other mixed anxiety disorders: Secondary | ICD-10-CM | POA: Diagnosis not present

## 2021-06-07 ENCOUNTER — Encounter: Payer: Self-pay | Admitting: Allergy

## 2021-06-07 ENCOUNTER — Ambulatory Visit: Payer: BC Managed Care – PPO | Admitting: Allergy

## 2021-06-07 ENCOUNTER — Other Ambulatory Visit: Payer: Self-pay

## 2021-06-07 ENCOUNTER — Ambulatory Visit (INDEPENDENT_AMBULATORY_CARE_PROVIDER_SITE_OTHER): Payer: BC Managed Care – PPO

## 2021-06-07 VITALS — BP 124/80 | HR 103 | Temp 97.5°F | Resp 16 | Ht 73.0 in | Wt 156.4 lb

## 2021-06-07 DIAGNOSIS — H1013 Acute atopic conjunctivitis, bilateral: Secondary | ICD-10-CM | POA: Diagnosis not present

## 2021-06-07 DIAGNOSIS — T7800XA Anaphylactic reaction due to unspecified food, initial encounter: Secondary | ICD-10-CM

## 2021-06-07 DIAGNOSIS — J3089 Other allergic rhinitis: Secondary | ICD-10-CM

## 2021-06-07 DIAGNOSIS — J4550 Severe persistent asthma, uncomplicated: Secondary | ICD-10-CM

## 2021-06-07 DIAGNOSIS — J4551 Severe persistent asthma with (acute) exacerbation: Secondary | ICD-10-CM

## 2021-06-07 NOTE — Progress Notes (Signed)
Follow-up Note  RE: James Matthews MRN: [id number] DOB: 05-28-1996 Date of Office Visit: 06/07/2021   History of present illness: James Matthews is a 25 y.o. male presenting today for sick visit for asthma.  He has history of asthma, allergic rhinitis, food allergy.  He was last seen in the office on 02/23/2021 by myself.  He presents today with his mother. He states over the last 2 to 4 days he has been having more shortness of breath, cough, wheeze that is impacting his day-to-day routine like getting dressed even.  He had to leave the gym due to being short of breath.  He has been needing to use his lev albuterol at least twice a day if not 3 times a day since symptoms have started.  He has not initiated Alvesco use.  His last Tezspire injection was on 04/25/21 thus he is about a week behind.  Over the past week he has had increase in asthma symptoms.   He states his grandmother died on 05/30/2021 and this has been a very stressful for him and his family.  This likely has not helped with his asthma symptoms are controlled. Prior when he was doing the Tezspire consistently every month his symptoms are under really good control.  States in October he had to travel dates where he was needing to fly and walk around airports and he states he had no issues doing this at all.  His asthma has been doing quite well since he has been on the [employer].   Regards to his allergies he states the hydroxyzine stopped working in regards to his sleep thus he was changed over to amitriptyline which he started last night.  He will overtakes the hydroxyzine. For myalgias and control he does have access to a long-acting antihistamine like Allegra or Xyzal as well as nasal steroid spray and Pataday eyedrop. He continues to avoid tree nuts.   Review of systems in the past 4 weeks: Review of Systems  Constitutional: Negative.   HENT: Negative.    Eyes: Negative.   Respiratory:  Positive for cough and  shortness of breath.   Cardiovascular: Negative.   Gastrointestinal: Negative.   Musculoskeletal: Negative.   Skin: Negative.   Neurological: Negative.    All other systems negative unless noted above in HPI  Past medical/social/surgical/family history have been reviewed and are unchanged unless specifically indicated below.  No changes  Medication List: Current Outpatient Medications  Medication Sig Dispense Refill   AIMOVIG 140 MG/ML SOAJ INJECT 1 PEN INTO THE SKIN EVERY 30 DAYS 1 mL 5   ALPRAZolam (XANAX) 1 MG tablet Take 1 mg by mouth 2 (two) times daily as needed for anxiety.      aluminum chloride (DRYSOL) 20 % external solution Apply topically at bedtime. once excessive sweating has stopped, may decrease to once or twice weekly, or as needed. Wash treated area in the morning. 60 mL 5   amitriptyline (ELAVIL) 10 MG tablet Take 10 mg by mouth at bedtime.     busPIRone (BUSPAR) 30 MG tablet TAKE 1 TABLET BY MOUTH TWICE A DAY 180 tablet 1   ciclesonide (ALVESCO) 160 MCG/ACT inhaler Inhale 2 puffs into the lungs 2 (two) times daily. 1 each 5   desvenlafaxine (PRISTIQ) 100 MG 24 hr tablet Take 200 mg by mouth daily.     EPINEPHRINE 0.3 mg/0.3 mL IJ SOAJ injection INJECT 0.3 MLS INTO THE MUSCLE ONCE FOR 1 DOSE. AS DIRECTED FOR LIFE-THREATENING ALLERGIC  REACTIONS 2 each 1   hydrOXYzine (ATARAX/VISTARIL) 25 MG tablet TAKE 1-2 TABLETS 30-60 MINUTES PRIOR TO BEDTIME FOR INSOMNIA, ANXIETY. 180 tablet 2   ketoconazole (NIZORAL) 2 % cream Apply 1 application topically daily. For athlete's foot 60 g 1   levalbuterol (XOPENEX HFA) 45 MCG/ACT inhaler INHALE 2 PUFFS INTO THE LUNGS EVERY 4 HOURS AS NEEDED FOR WHEEZE 15 g 1   levalbuterol (XOPENEX) 1.25 MG/3ML nebulizer solution 1 vial via nebulizer every 4-6 hours as needed. 75 mL 1   losartan (COZAAR) 50 MG tablet Take 1.5 tablets (75 mg total) by mouth daily. 45 tablet 5   olopatadine (PATADAY) 0.1 % ophthalmic solution Place 1 drop into both eyes  daily as needed for allergies. 5 mL 12   ondansetron (ZOFRAN-ODT) 8 MG disintegrating tablet Take 1 tablet (8 mg total) by mouth every 8 (eight) hours as needed for nausea or vomiting. 20 tablet 3   rizatriptan (MAXALT) 10 MG tablet Take 1 tablet earliest onset of migraine.  May repeat in 2 hours if needed.  Maximum 2 tablets in 24 hours 10 tablet 5   Current Facility-Administered Medications  Medication Dose Route Frequency Provider Last Rate Last Admin   tezepelumab-ekko (TEZSPIRE) 210 MG/1. syringe 210 mg  210 mg Subcutaneous Q28 days Marcelyn Bruins, MD   210 mg at 06/07/21 1711     Known medication allergies: Allergies  Allergen Reactions   Albuterol     High HR   Chocolate     Face swelling/sounds like angioedema "I had to go to urgent care today due to an allergic reaction to a chocolate candy bar. My throat swelled up and was closing, my nasal passages were congested, and my face was hurting. They gave me a steroid shot at the clinic, and then they sent me home presidone to take for a week" 06/2019   Lisinopril Other (See Comments)   Other Other (See Comments)    Pain med? Unsure of medication, caused n/v     Physical examination: Blood pressure 124/80, pulse (!) 103, temperature (!) 97.5 F (36.4 C), resp. rate 16, height 6\' 1"  (1.854 m), weight 156 lb 6.4 oz (70.9 kg), SpO2 100 %.  General: Alert, interactive, in no acute distress. HEENT: PERRLA, TMs pearly gray, turbinates minimally edematous without discharge, post-pharynx non erythematous. Neck: Supple without lymphadenopathy. Lungs: Clear to auscultation without wheezing, rhonchi or rales. {no increased work of breathing. CV: Normal S1, S2 without murmurs. Abdomen: Nondistended, nontender. Skin: Warm and dry, without lesions or rashes  Lab. Extremities:  No clubbing, cyanosis or edema. Neuro:   Grossly intact.  Diagnositics/Labs:  Spirometry: FEV1: 4.95L 106%, FVC: 6.28L 110%, ratio consistent with  nonobstructive pattern  Assessment and plan: Asthma, with current exacerbation - have access to levalbuterol inhaler 2 puffs or Xopenex 1 vial via nebulizer every 4-6 hours as needed for cough/wheeze/shortness of breath/chest tightness.  May use 15-20 minutes prior to activity.   Monitor frequency of use.   -Asthma action plan (if having respiratory illness or asthma flare) initiate: start Alvesco  2 puffs twice a day spacer device.    This is a pro-drug thus it is not active until it reaches lung tissue thus should reduce your risk for thrush.  -if symptoms are not improved by Friday then take prednisone burst pack you have at home as directed - Continue Tezpire monthly injections.  Provided today in office -if needed consider pulmonary referral for pulmonary rehab if above regimen not effective enough  in controlling asthma symptoms -good news is lung function looks great today!  Asthma control goals:  Full participation in all desired activities (may need albuterol before activity) Albuterol use two time or less a week on average (not counting use with activity) Cough interfering with sleep two time or less a month Oral steroids no more than once a year No hospitalizations  Allergies - continue avoidance measures for grass pollens, weed pollens, tree pollens, molds, dust mite, cat, mouse, mixed feathers - can use Allegra 180mg  or Xyzal 5mg  daily as needed  - for watery eyes can use of Pataday 1 drop each eye daily as needed - for nasal congestion use of nasal steroid spray like Flonase no more than 3 times a week due to nosebleeds.    Allergic reaction  - continue avoidance of tree nuts  - have access to self-injectable epinephrine AuviQ 0.3mg  at all times  - follow emergency action plan in case of allergic reaction  Follow-up in 6 months or sooner if needed   I appreciate the opportunity to take part in Jann's care. Please do not hesitate to contact me with  questions.  Sincerely,   , MD Allergy/Immunology [facility]

## 2021-06-07 NOTE — Patient Instructions (Signed)
Asthma  - have access to levalbuterol inhaler 2 puffs or Xopenex 1 vial via nebulizer every 4-6 hours as needed for cough/wheeze/shortness of breath/chest tightness.  May use 15-20 minutes prior to activity.   Monitor frequency of use.   -Asthma action plan (if having respiratory illness or asthma flare) initiate: start Alvesco  2 puffs twice a day spacer device.    This is a pro-drug thus it is not active until it reaches lung tissue thus should reduce your risk for thrush.  -if symptoms are not improved by Friday then take prednisone burst pack you have at home as directed - Continue Tezpire monthly injections.  Provided today in office -if needed consider pulmonary referral for pulmonary rehab if above regimen not effective enough in controlling asthma symptoms -good news is lung function looks great today!  Asthma control goals:  Full participation in all desired activities (may need albuterol before activity) Albuterol use two time or less a week on average (not counting use with activity) Cough interfering with sleep two time or less a month Oral steroids no more than once a year No hospitalizations  Allergies - continue avoidance measures for grass pollens, weed pollens, tree pollens, molds, dust mite, cat, mouse, mixed feathers - can use Allegra 180mg  or Xyzal 5mg  daily as needed  - for watery eyes can use of Pataday 1 drop each eye daily as needed - for nasal congestion use of nasal steroid spray like Flonase no more than 3 times a week due to nosebleeds.    Allergic reaction  - continue avoidance of tree nuts  - have access to self-injectable epinephrine AuviQ 0.3mg  at all times  - follow emergency action plan in case of allergic reaction  Follow-up in 6 months or sooner if needed

## 2021-06-08 DIAGNOSIS — F4312 Post-traumatic stress disorder, chronic: Secondary | ICD-10-CM | POA: Diagnosis not present

## 2021-06-08 DIAGNOSIS — F411 Generalized anxiety disorder: Secondary | ICD-10-CM | POA: Diagnosis not present

## 2021-06-08 NOTE — Addendum Note (Signed)
Addended by: Rolland Bimler D on: 06/08/2021 11:56 AM   Modules accepted: Orders

## 2021-06-09 ENCOUNTER — Ambulatory Visit: Payer: BC Managed Care – PPO

## 2021-06-12 ENCOUNTER — Ambulatory Visit: Payer: BC Managed Care – PPO

## 2021-06-12 ENCOUNTER — Encounter: Payer: Self-pay | Admitting: Allergy

## 2021-06-13 ENCOUNTER — Other Ambulatory Visit: Payer: Self-pay | Admitting: Family Medicine

## 2021-06-13 DIAGNOSIS — F4312 Post-traumatic stress disorder, chronic: Secondary | ICD-10-CM | POA: Diagnosis not present

## 2021-06-13 DIAGNOSIS — F411 Generalized anxiety disorder: Secondary | ICD-10-CM | POA: Diagnosis not present

## 2021-06-20 DIAGNOSIS — F411 Generalized anxiety disorder: Secondary | ICD-10-CM | POA: Diagnosis not present

## 2021-06-20 DIAGNOSIS — F4312 Post-traumatic stress disorder, chronic: Secondary | ICD-10-CM | POA: Diagnosis not present

## 2021-06-27 DIAGNOSIS — F4312 Post-traumatic stress disorder, chronic: Secondary | ICD-10-CM | POA: Diagnosis not present

## 2021-06-27 DIAGNOSIS — F411 Generalized anxiety disorder: Secondary | ICD-10-CM | POA: Diagnosis not present

## 2021-06-29 DIAGNOSIS — N200 Calculus of kidney: Secondary | ICD-10-CM | POA: Diagnosis not present

## 2021-06-29 DIAGNOSIS — N1339 Other hydronephrosis: Secondary | ICD-10-CM | POA: Diagnosis not present

## 2021-06-29 DIAGNOSIS — Q642 Congenital posterior urethral valves: Secondary | ICD-10-CM | POA: Diagnosis not present

## 2021-06-29 DIAGNOSIS — J4550 Severe persistent asthma, uncomplicated: Secondary | ICD-10-CM | POA: Diagnosis not present

## 2021-06-29 DIAGNOSIS — N182 Chronic kidney disease, stage 2 (mild): Secondary | ICD-10-CM | POA: Diagnosis not present

## 2021-07-04 DIAGNOSIS — B349 Viral infection, unspecified: Secondary | ICD-10-CM | POA: Diagnosis not present

## 2021-07-04 DIAGNOSIS — F4312 Post-traumatic stress disorder, chronic: Secondary | ICD-10-CM | POA: Diagnosis not present

## 2021-07-04 DIAGNOSIS — Z03818 Encounter for observation for suspected exposure to other biological agents ruled out: Secondary | ICD-10-CM | POA: Diagnosis not present

## 2021-07-04 DIAGNOSIS — F411 Generalized anxiety disorder: Secondary | ICD-10-CM | POA: Diagnosis not present

## 2021-07-05 ENCOUNTER — Ambulatory Visit: Payer: BC Managed Care – PPO

## 2021-07-11 ENCOUNTER — Ambulatory Visit (INDEPENDENT_AMBULATORY_CARE_PROVIDER_SITE_OTHER): Payer: BC Managed Care – PPO | Admitting: *Deleted

## 2021-07-11 ENCOUNTER — Other Ambulatory Visit: Payer: Self-pay

## 2021-07-11 DIAGNOSIS — F4312 Post-traumatic stress disorder, chronic: Secondary | ICD-10-CM | POA: Diagnosis not present

## 2021-07-11 DIAGNOSIS — J4550 Severe persistent asthma, uncomplicated: Secondary | ICD-10-CM

## 2021-07-11 DIAGNOSIS — F411 Generalized anxiety disorder: Secondary | ICD-10-CM | POA: Diagnosis not present

## 2021-07-18 ENCOUNTER — Other Ambulatory Visit: Payer: Self-pay

## 2021-07-18 ENCOUNTER — Encounter: Payer: Self-pay | Admitting: Allergy

## 2021-07-18 ENCOUNTER — Encounter: Payer: Self-pay | Admitting: Allergy & Immunology

## 2021-07-18 ENCOUNTER — Ambulatory Visit (INDEPENDENT_AMBULATORY_CARE_PROVIDER_SITE_OTHER): Payer: BC Managed Care – PPO | Admitting: Allergy & Immunology

## 2021-07-18 VITALS — BP 130/90 | HR 95 | Temp 98.1°F | Resp 16 | Ht 73.0 in | Wt 150.0 lb

## 2021-07-18 DIAGNOSIS — T7800XA Anaphylactic reaction due to unspecified food, initial encounter: Secondary | ICD-10-CM

## 2021-07-18 DIAGNOSIS — F411 Generalized anxiety disorder: Secondary | ICD-10-CM | POA: Diagnosis not present

## 2021-07-18 DIAGNOSIS — J4550 Severe persistent asthma, uncomplicated: Secondary | ICD-10-CM | POA: Diagnosis not present

## 2021-07-18 DIAGNOSIS — F4312 Post-traumatic stress disorder, chronic: Secondary | ICD-10-CM | POA: Diagnosis not present

## 2021-07-18 DIAGNOSIS — J3089 Other allergic rhinitis: Secondary | ICD-10-CM | POA: Diagnosis not present

## 2021-07-18 MED ORDER — EPINEPHRINE 0.3 MG/0.3ML IJ SOAJ: INTRAMUSCULAR | 1 refills | Status: DC

## 2021-07-18 MED ORDER — ALBUTEROL SULFATE HFA 108 (90 BASE) MCG/ACT IN AERS: 2.0000 | INHALATION_SPRAY | Freq: Four times a day (QID) | RESPIRATORY_TRACT | 2 refills | Status: DC | PRN

## 2021-07-18 MED ORDER — ALVESCO 160 MCG/ACT IN AERS: 2.0000 | INHALATION_SPRAY | Freq: Two times a day (BID) | RESPIRATORY_TRACT | 5 refills | Status: DC

## 2021-07-18 NOTE — Progress Notes (Addendum)
FOLLOW UP  Date of Service/Encounter:  07/18/21   Assessment:   Severe persistent asthma with acute exacerbation - not using a regular maintenance medication  Perennial and seasonal allergic rhinitis (grasses, weeds, trees, molds, dust mite, cat, mouse, mixed feathers)  Anaphylaxis due to food (tree nuts)  Plan/Recommendations:   Asthma  - Lung testing looked great today. - We are going to start you on prednisone out of an abundance of caution.  - Tezspire is no intended to be used as monotherapy (ie by itself), so please resume using your Alvesco two puffs at least once daily. - This might help prevent future flares. - I will let Dr. Nelva Bush know that we saw you to keep her in the loop.  - Daily controller medication(s): Alvesco 139mcg two puffs once daily + Tezspire monthly - Rescue medications: Xopenex 4 puffs every 4-6 hours as needed - Changes during respiratory infections or worsening symptoms: Increase Alvesco 167mcg to 4 puffs twice daily for TWO WEEKS. - Asthma control goals:  * Full participation in all desired activities (may need albuterol before activity) * Albuterol use two time or less a week on average (not counting use with activity) * Cough interfering with sleep two time or less a month * Oral steroids no more than once a year * No hospitalizations  Allergies - Continue avoidance measures for grass pollens, weed pollens, tree pollens, molds, dust mite, cat, mouse, mixed feathers - Continue using Allegra 180mg  or Xyzal 5mg  daily as needed  - For watery eyes can use of Pataday 1 drop each eye daily as needed - For nasal congestion use of nasal steroid spray like Flonase no more than 3 times a week due to nosebleeds.    Allergic reaction - Continue avoidance of tree nuts - Have access to self-injectable epinephrine AuviQ 0.3mg  at all times - Follow emergency action plan in case of allergic reaction  Follow up as scheduled with Dr. Nelva Bush.     Subjective:   James Matthews is a 26 y.o. male presenting today for follow up of  Chief Complaint  Patient presents with   Asthma    ACT 10 Last night sob, nauseous, throwing up, had to lie down for several hours and that was the only thing that settled it down some. Took rescue inhaler three times but it did not provide relief.    Other    Paperwork for disability parking.    James Matthews has a history of the following: Patient Active Problem List   Diagnosis Date Noted   Orthostatic hypotension 09/02/2020   Syncope 05/27/2020   Palpitations 05/27/2020   PTSD (post-traumatic stress disorder) 10/19/2019   Daily headache 06/02/2019   Asthma, severe persistent 05/25/2019   Social anxiety disorder 05/04/2018   Insomnia 05/04/2018   Mesenteric lymphadenopathy 07/27/2016   Posterior urethral valves (PUV) 06/04/2016   GERD (gastroesophageal reflux disease) 06/04/2016   Essential hypertension 06/04/2016   Bladder spasms 06/04/2016   GAD (generalized anxiety disorder) 06/04/2016   CKD (chronic kidney disease), stage II 06/04/2016   Major depression, recurrent ([place]) 06/04/2016   Premature birth    IBS (irritable bowel syndrome)    Verbal apraxia     History obtained from: chart review and patient.  James is a 26 y.o. male presenting for a sick visit. She was last seen in November 2022. At that time, he was diagnosed with an asthma exacerbation. He was continued on Tezspire as well as Xopenex as Alvesco added during  respiratory flares.  He has had difficulty with getting a controller and keeping it due to either oral thrush or urinary retention associated with LAMAs.  His lung function looked excellent, which seem to be his baseline even during periods of asthma exacerbations.  For his allergies, he was continued on Allegra or Xyzal daily as well as Flonase 3 times weekly.  He continues to carry an epinephrine autoinjector for his tree nut allergy. Pulmonology  referral discussed.   Since last visit, he has mostly done well.  Unfortunately, he has missed his Tezspire injections for a few days for a few months in a row.  His grandmother passed away around the date of one of his injections. His entire family had a viral illness which pushed one of his other injections back a few days.  He lives in the basement of his parnes' home. He has to go up and down the stairs. Breathing was getting more difficult. Everything really kicked in yesterday. He was having difficulties with breathing yesterday morning. He was on a call at the time and realized that he was having problems breathing during the [place]. Rescue inhaler did not help. He ended up vomiting.  Vomiting is not a common symptom during asthma exacerbations, but it has happened in the past.  Asthma/Respiratory Symptom History: He is not on a daily inhaler at this point in time. The [employer] [facility] was doing so well, that they decided just to stop the controllers. He was off of the inhalers for two months and he only needed the inhaler a few times. Prior to that, he was on Alvsceo and prior to that he was on AiruDuo.  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   Allergic Rhinitis Symptom History: He remains on the antihistamine daily.  He also has Flonase that he uses 2-3 times a week.  He has not required any antibiotics since last time we saw him.  Food Allergy Symptom History: He has not had any treatment exposure.  He does need a new EpiPen.  Otherwise, there have been no changes to his past medical history, surgical history, family history, or social history.    Review of Systems  Constitutional: Negative.  Negative for chills, fever, malaise/fatigue and weight loss.  HENT:  Negative for congestion, ear discharge, ear pain and sinus pain.   Eyes:  Negative for pain, discharge and  redness.  Respiratory:  Positive for cough and wheezing. Negative for sputum production and shortness of breath.   Cardiovascular: Negative.  Negative for chest pain and palpitations.  Gastrointestinal:  Negative for abdominal pain, constipation, diarrhea, heartburn, nausea and vomiting.  Skin: Negative.  Negative for itching and rash.  Neurological:  Negative for dizziness and headaches.  Endo/Heme/Allergies:  Positive for environmental allergies. Does not bruise/bleed easily.      Objective:   Blood pressure 130/90, pulse 95, temperature 98.1 F (36.7 C), temperature source Temporal, resp. rate 16, height 6\' 1"  (1.854 m), weight 150 lb (68 kg), SpO2 100 %. Body mass index is 19.79 kg/m.   Physical Exam:  Physical Exam Vitals reviewed.  Constitutional:      Appearance: He is well-developed.     Comments: Speaking in full sentences without needing large gasps of air.   HENT:     Head: Normocephalic and atraumatic.     Right Ear: Tympanic membrane, ear canal and external  ear normal.     Left Ear: Tympanic membrane, ear canal and external ear normal.     Nose: No nasal deformity, septal deviation, mucosal edema or rhinorrhea.     Right Turbinates: Enlarged, swollen and pale.     Left Turbinates: Enlarged, swollen and pale.     Right Sinus: No maxillary sinus tenderness or frontal sinus tenderness.     Left Sinus: No maxillary sinus tenderness or frontal sinus tenderness.     Mouth/Throat:     Mouth: Mucous membranes are not pale and not dry.     Pharynx: Uvula midline.  Eyes:     General: Lids are normal. No allergic shiner.       Right eye: No discharge.        Left eye: No discharge.     Conjunctiva/sclera: Conjunctivae normal.     Right eye: Right conjunctiva is not injected. No chemosis.    Left eye: Left conjunctiva is not injected. No chemosis.    Pupils: Pupils are equal, round, and reactive to light.  Cardiovascular:     Rate and Rhythm: Normal rate and regular  rhythm.     Heart sounds: Normal heart sounds.  Pulmonary:     Effort: Pulmonary effort is normal. No tachypnea, accessory muscle usage or respiratory distress.     Breath sounds: Normal breath sounds. No wheezing, rhonchi or rales.     Comments: Moving air well in all lung fields. No wheezing. Possibly  Chest:     Chest wall: No tenderness.  Lymphadenopathy:     Cervical: No cervical adenopathy.  Skin:    Coloration: Skin is not pale.     Findings: No abrasion, erythema, petechiae or rash. Rash is not papular, urticarial or vesicular.  Neurological:     Mental Status: He is alert.  Psychiatric:        Behavior: Behavior is cooperative.     Diagnostic studies:    Spirometry: results normal (FEV1: 4.29/90%, FVC: 5.19/91%, FEV1/FVC: 83%).    Spirometry consistent with normal pattern.   Allergy Studies: none          Salvatore Marvel, MD  [facility]

## 2021-07-18 NOTE — Progress Notes (Incomplete Revision)
FOLLOW UP  Date of Service/Encounter:  07/18/21   Assessment:   Severe persistent asthma with acute exacerbation - not using a regular maintenance medication  Perennial and seasonal allergic rhinitis (grasses, weeds, trees, molds, dust mite, cat, mouse, mixed feathers)  Anaphylaxis due to food (tree nuts)  Plan/Recommendations:   Asthma  - Lung testing looked great today. - We are going to start you on prednisone out of an abundance of caution.  - Tezspire is no intended to be used as monotherapy (ie by itself), so please resume using your Alvesco two puffs at least once daily. - This might help prevent future flares. - I will let Dr. Nelva Bush know that we saw you to keep her in the loop.  - Daily controller medication(s): Alvesco 159mcg two puffs once daily + Tezspire monthly - Rescue medications: Xopenex 4 puffs every 4-6 hours as needed - Changes during respiratory infections or worsening symptoms: Increase Alvesco 114mcg to 4 puffs twice daily for TWO WEEKS. - Asthma control goals:  * Full participation in all desired activities (may need albuterol before activity) * Albuterol use two time or less a week on average (not counting use with activity) * Cough interfering with sleep two time or less a month * Oral steroids no more than once a year * No hospitalizations  Allergies - Continue avoidance measures for grass pollens, weed pollens, tree pollens, molds, dust mite, cat, mouse, mixed feathers - Continue using Allegra 180mg  or Xyzal 5mg  daily as needed  - For watery eyes can use of Pataday 1 drop each eye daily as needed - For nasal congestion use of nasal steroid spray like Flonase no more than 3 times a week due to nosebleeds.    Allergic reaction - Continue avoidance of tree nuts - Have access to self-injectable epinephrine AuviQ 0.3mg  at all times - Follow emergency action plan in case of allergic reaction  Follow up as scheduled with Dr. Nelva Bush.     Subjective:   James Matthews is a 26 y.o. male presenting today for follow up of  Chief Complaint  Patient presents with   Asthma    ACT 10 Last night sob, nauseous, throwing up, had to lie down for several hours and that was the only thing that settled it down some. Took rescue inhaler three times but it did not provide relief.    Other    Paperwork for disability parking.    James Jaquavion Leftridge has a history of the following: Patient Active Problem List   Diagnosis Date Noted   Orthostatic hypotension 09/02/2020   Syncope 05/27/2020   Palpitations 05/27/2020   PTSD (post-traumatic stress disorder) 10/19/2019   Daily headache 06/02/2019   Asthma, severe persistent 05/25/2019   Social anxiety disorder 05/04/2018   Insomnia 05/04/2018   Mesenteric lymphadenopathy 07/27/2016   Posterior urethral valves (PUV) 06/04/2016   GERD (gastroesophageal reflux disease) 06/04/2016   Essential hypertension 06/04/2016   Bladder spasms 06/04/2016   GAD (generalized anxiety disorder) 06/04/2016   CKD (chronic kidney disease), stage II 06/04/2016   Major depression, recurrent ([place]) 06/04/2016   Premature birth    IBS (irritable bowel syndrome)    Verbal apraxia     History obtained from: chart review and patient.  James is a 26 y.o. male presenting for a sick visit. She was last seen in November 2022. At that time, he was diagnosed with an asthma exacerbation. He was continued on Tezspire as well as Xopenex as Alvesco added during  respiratory flares.  He has had difficulty with getting a controller and keeping it due to either oral thrush or urinary retention associated with LAMAs.  His lung function looked excellent, which seem to be his baseline even during periods of asthma exacerbations.  For his allergies, he was continued on Allegra or Xyzal daily as well as Flonase 3 times weekly.  He continues to carry an epinephrine autoinjector for his tree nut  allergy. Pulmonology referral discussed.   Since last visit, he has mostly done well.  Unfortunately, he has missed his Tezspire injections for a few days for a few months in a row.  His grandmother passed away around the date of one of his injections. His entire family had a viral illness which pushed one of his other injections back a few days.  He lives in the basement of his parnes' home. He has to go up and down the stairs. Breathing was getting more difficult. Everything really kicked in yesterday. He was having difficulties with breathing yesterday morning. He was on a call at the time and realized that he was having problems breathing during the [place]. Rescue inhaler did not help. He ended up vomiting.  Vomiting is not a common symptom during asthma exacerbations, but it has happened in the past.  Asthma/Respiratory Symptom History: He is not on a daily inhaler at this point in time. The [facility] was doing so well, that they decided just to stop the controllers. He was off of the inhalers for two months and he only needed the inhaler a few times. Prior to that, he was on Alvsceo and prior to that he was on AiruDuo.  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   Allergic Rhinitis Symptom History: He remains on the antihistamine daily.  He also has Flonase that he uses 2-3 times a week.  He has not required any antibiotics since last time we saw him.  Food Allergy Symptom History: He has not had any treatment exposure.  He does need a new EpiPen.  Otherwise, there have been no changes to his past medical history, surgical history, family history, or social history.    Review of Systems  Constitutional: Negative.  Negative for chills, fever, malaise/fatigue and weight loss.  HENT:  Negative for congestion, ear discharge, ear pain and sinus pain.   Eyes:  Negative for  pain, discharge and redness.  Respiratory:  Positive for cough and wheezing. Negative for sputum production and shortness of breath.   Cardiovascular: Negative.  Negative for chest pain and palpitations.  Gastrointestinal:  Negative for abdominal pain, constipation, diarrhea, heartburn, nausea and vomiting.  Skin: Negative.  Negative for itching and rash.  Neurological:  Negative for dizziness and headaches.  Endo/Heme/Allergies:  Positive for environmental allergies. Does not bruise/bleed easily.      Objective:   Blood pressure 130/90, pulse 95, temperature 98.1 F (36.7 C), temperature source Temporal, resp. rate 16, height 6\' 1"  (1.854 m), weight 150 lb (68 kg), SpO2 100 %. Body mass index is 19.79 kg/m.   Physical Exam:  Physical Exam Vitals reviewed.  Constitutional:      Appearance: He is well-developed.     Comments: Speaking in full sentences without needing large gasps of air.   HENT:     Head: Normocephalic and atraumatic.     Right Ear: Tympanic membrane, ear canal and external  ear normal.     Left Ear: Tympanic membrane, ear canal and external ear normal.     Nose: No nasal deformity, septal deviation, mucosal edema or rhinorrhea.     Right Turbinates: Enlarged, swollen and pale.     Left Turbinates: Enlarged, swollen and pale.     Right Sinus: No maxillary sinus tenderness or frontal sinus tenderness.     Left Sinus: No maxillary sinus tenderness or frontal sinus tenderness.     Mouth/Throat:     Mouth: Mucous membranes are not pale and not dry.     Pharynx: Uvula midline.  Eyes:     General: Lids are normal. No allergic shiner.       Right eye: No discharge.        Left eye: No discharge.     Conjunctiva/sclera: Conjunctivae normal.     Right eye: Right conjunctiva is not injected. No chemosis.    Left eye: Left conjunctiva is not injected. No chemosis.    Pupils: Pupils are equal, round, and reactive to light.  Cardiovascular:     Rate and Rhythm: Normal  rate and regular rhythm.     Heart sounds: Normal heart sounds.  Pulmonary:     Effort: Pulmonary effort is normal. No tachypnea, accessory muscle usage or respiratory distress.     Breath sounds: Normal breath sounds. No wheezing, rhonchi or rales.     Comments: Moving air well in all lung fields. No wheezing. Possibly  Chest:     Chest wall: No tenderness.  Lymphadenopathy:     Cervical: No cervical adenopathy.  Skin:    Coloration: Skin is not pale.     Findings: No abrasion, erythema, petechiae or rash. Rash is not papular, urticarial or vesicular.  Neurological:     Mental Status: He is alert.  Psychiatric:        Behavior: Behavior is cooperative.     Diagnostic studies:    Spirometry: results normal (FEV1: 4.29/90%, FVC: 5.19/91%, FEV1/FVC: 83%).    Spirometry consistent with normal pattern.   Allergy Studies: none          Salvatore Marvel, MD  [facility]

## 2021-07-18 NOTE — Patient Instructions (Addendum)
Asthma  - Lung testing looked great today. - We are going to start you on prednisone out of an abundance of caution.  - Tezspire is no intended to be used as monotherapy (ie by itself), so please resume using your Alvesco two puffs at least once daily. - This might help prevent future flares. - I will let Dr. Delorse Lek know that we saw you to keep her in the loop.  - Daily controller medication(s): Alvesco two puffs once daily + Tezspire monthly - Rescue medications: Xopenex 4 puffs every 4-6 hours as needed - Changes during respiratory infections or worsening symptoms: Increase Alvesco to 4 puffs twice daily for TWO WEEKS. - Asthma control goals:  * Full participation in all desired activities (may need albuterol before activity) * Albuterol use two time or less a week on average (not counting use with activity) * Cough interfering with sleep two time or less a month * Oral steroids no more than once a year * No hospitalizations  Allergies - Continue avoidance measures for grass pollens, weed pollens, tree pollens, molds, dust mite, cat, mouse, mixed feathers - Continue using Allegra 180mg  or Xyzal 5mg  daily as needed  - For watery eyes can use of Pataday 1 drop each eye daily as needed - For nasal congestion use of nasal steroid spray like Flonase no more than 3 times a week due to nosebleeds.    Allergic reaction - Continue avoidance of tree nuts - Have access to self-injectable epinephrine AuviQ 0.3mg  at all times - Follow emergency action plan in case of allergic reaction  Follow up as scheduled with Dr. .

## 2021-07-25 DIAGNOSIS — F411 Generalized anxiety disorder: Secondary | ICD-10-CM | POA: Diagnosis not present

## 2021-07-25 DIAGNOSIS — F4312 Post-traumatic stress disorder, chronic: Secondary | ICD-10-CM | POA: Diagnosis not present

## 2021-07-26 ENCOUNTER — Encounter: Payer: Self-pay | Admitting: Neurology

## 2021-07-27 ENCOUNTER — Other Ambulatory Visit: Payer: Self-pay

## 2021-07-27 ENCOUNTER — Other Ambulatory Visit: Payer: Self-pay | Admitting: *Deleted

## 2021-07-27 MED ORDER — ONDANSETRON 8 MG PO TBDP: 8.0000 mg | ORAL_TABLET | Freq: Three times a day (TID) | ORAL | 3 refills | Status: AC | PRN

## 2021-07-27 MED ORDER — RIZATRIPTAN BENZOATE 10 MG PO TABS: ORAL_TABLET | ORAL | 5 refills | Status: DC

## 2021-07-27 MED ORDER — ALVESCO 160 MCG/ACT IN AERS: 2.0000 | INHALATION_SPRAY | Freq: Two times a day (BID) | RESPIRATORY_TRACT | 5 refills | Status: DC

## 2021-07-28 DIAGNOSIS — R112 Nausea with vomiting, unspecified: Secondary | ICD-10-CM | POA: Diagnosis not present

## 2021-07-28 DIAGNOSIS — R809 Proteinuria, unspecified: Secondary | ICD-10-CM | POA: Diagnosis not present

## 2021-07-28 DIAGNOSIS — N182 Chronic kidney disease, stage 2 (mild): Secondary | ICD-10-CM | POA: Diagnosis not present

## 2021-07-28 DIAGNOSIS — R3989 Other symptoms and signs involving the genitourinary system: Secondary | ICD-10-CM | POA: Diagnosis not present

## 2021-07-28 DIAGNOSIS — R109 Unspecified abdominal pain: Secondary | ICD-10-CM | POA: Diagnosis not present

## 2021-07-28 DIAGNOSIS — I129 Hypertensive chronic kidney disease with stage 1 through stage 4 chronic kidney disease, or unspecified chronic kidney disease: Secondary | ICD-10-CM | POA: Diagnosis not present

## 2021-07-28 DIAGNOSIS — E790 Hyperuricemia without signs of inflammatory arthritis and tophaceous disease: Secondary | ICD-10-CM | POA: Diagnosis not present

## 2021-07-28 DIAGNOSIS — Z8719 Personal history of other diseases of the digestive system: Secondary | ICD-10-CM | POA: Diagnosis not present

## 2021-07-28 DIAGNOSIS — Z905 Acquired absence of kidney: Secondary | ICD-10-CM | POA: Diagnosis not present

## 2021-07-28 DIAGNOSIS — Z87440 Personal history of urinary (tract) infections: Secondary | ICD-10-CM | POA: Diagnosis not present

## 2021-08-02 ENCOUNTER — Other Ambulatory Visit: Payer: Self-pay

## 2021-08-02 ENCOUNTER — Encounter: Payer: Self-pay | Admitting: Allergy

## 2021-08-02 ENCOUNTER — Ambulatory Visit (INDEPENDENT_AMBULATORY_CARE_PROVIDER_SITE_OTHER): Payer: BC Managed Care – PPO | Admitting: Allergy

## 2021-08-02 VITALS — BP 110/62 | HR 86 | Temp 98.1°F | Resp 16

## 2021-08-02 DIAGNOSIS — J3089 Other allergic rhinitis: Secondary | ICD-10-CM

## 2021-08-02 DIAGNOSIS — T7800XA Anaphylactic reaction due to unspecified food, initial encounter: Secondary | ICD-10-CM

## 2021-08-02 DIAGNOSIS — J4550 Severe persistent asthma, uncomplicated: Secondary | ICD-10-CM | POA: Diagnosis not present

## 2021-08-02 NOTE — Patient Instructions (Addendum)
Asthma  - Dorothea Ogle is not intended to be used as monotherapy (ie by itself)  - Daily controller medication(s): Alvesco two puffs twice daily + Tezspire monthly - Rescue medications: Xopenex 4 puffs every 4-6 hours as needed - Changes during respiratory infections or worsening symptoms: Increase Alvesco to 4 puffs twice daily for TWO WEEKS. - Will have second opinion on asthma and control with pulmonology referral - Asthma control goals:  * Full participation in all desired activities (may need albuterol before activity) * Albuterol use two time or less a week on average (not counting use with activity) * Cough interfering with sleep two time or less a month * Oral steroids no more than once a year * No hospitalizations  Allergies - Continue avoidance measures for grass pollens, weed pollens, tree pollens, molds, dust mite, cat, mouse, mixed feathers - Continue using Allegra 180mg  or Xyzal 5mg  daily as needed  - For watery eyes can use of Pataday 1 drop each eye daily as needed - For nasal congestion use of nasal steroid spray like Flonase no more than 3 times a week due to nosebleeds.    Allergic reaction - Continue avoidance of tree nuts - Have access to self-injectable epinephrine AuviQ 0.3mg  at all times - Follow emergency action plan in case of allergic reaction  Follow up in 3-4 months or sooner if needed

## 2021-08-02 NOTE — Progress Notes (Signed)
Follow-up Note  RE: James Christian Staib MRN: [id number] DOB: [birth year] Date of Office Visit: 08/02/2021   History of present illness: James Matthews is a 26 y.o. male presenting today for follow-up of asthma.  He also has history of allergic rhinitis with conjunctivitis and food allergy.  He presents today with his mother. He was last seen on 07/18/2021 Dr. Dellis Anes for asthma exacerbation where he was treated with a prednisone burst. He does feel his breathing has improved over the past 2 weeks after getting the prednisone burst.  He is using alvesco 2 puffs twice a day with spacer device currently. He states over the past 2 weeks using xopenex at least once a day at this time.   He presents today as he states he wanted to discuss his asthma control regimen infrequent which inhalers he needs to be using.  Prior to his last visit he was not using the Alvesco on a daily consistent basis thus it was recommended that he start doing so to help with his control. He states he is quite tired today as he did not sleep well last night.  He did start back using the hydroxyzine but states last night he just did not get good rest. He does use an antihistamine like Allegra or Xyzal as needed for allergy symptom control and he also has access to olopatadine eyedrops as well as nasal steroid spray.  However this is not the typical time of the year where his allergies would be more prevalent. He does avoid tree nuts and has access to his epinephrine device.  Review of systems in the past 4 weeks: Review of Systems  Constitutional:  Positive for fatigue.  HENT: Negative.    Eyes: Negative.   Respiratory:  Positive for cough, chest tightness and shortness of breath.   Cardiovascular: Negative.   Musculoskeletal: Negative.   Skin: Negative.   Allergic/Immunologic: Negative.   Neurological: Negative.     All other systems negative unless noted above in HPI  Past medical/social/surgical/family  history have been reviewed and are unchanged unless specifically indicated below.  No changes  Medication List: Current Outpatient Medications  Medication Sig Dispense Refill   AIMOVIG 140 MG/ML SOAJ INJECT 1 PEN INTO THE SKIN EVERY 30 DAYS 1 mL 5   albuterol (VENTOLIN HFA) 108 (90 Base) MCG/ACT inhaler Inhale 2 puffs into the lungs every 6 (six) hours as needed for wheezing or shortness of breath. 8 g 2   ALPRAZolam (XANAX) 1 MG tablet Take 1 mg by mouth 2 (two) times daily as needed for anxiety.      ALVESCO 160 MCG/ACT inhaler Inhale 2 puffs into the lungs 2 (two) times daily. 1 each 5   amitriptyline (ELAVIL) 10 MG tablet Take 10 mg by mouth at bedtime.     busPIRone (BUSPAR) 30 MG tablet TAKE 1 TABLET BY MOUTH TWICE A DAY 180 tablet 1   desvenlafaxine (PRISTIQ) 100 MG 24 hr tablet Take 200 mg by mouth daily.     EPINEPHrine 0.3 mg/0.3 mL IJ SOAJ injection INJECT 0.3 MLS INTO THE MUSCLE ONCE FOR 1 DOSE. AS DIRECTED FOR LIFE-THREATENING ALLERGIC REACTIONS 1 each 1   fexofenadine (ALLEGRA ODT) 30 MG disintegrating tablet See admin instructions.     levalbuterol (XOPENEX HFA) 45 MCG/ACT inhaler INHALE 2 PUFFS INTO THE LUNGS EVERY 4 HOURS AS NEEDED FOR WHEEZE 15 g 1   levalbuterol (XOPENEX) 1.25 MG/3ML nebulizer solution 1 vial via nebulizer every 4-6 hours as needed. 75  mL 1   losartan (COZAAR) 50 MG tablet Take 1.5 tablets (75 mg total) by mouth daily. 45 tablet 5   melatonin 3 MG TABS tablet 1 tablet at bedtime as needed     Multiple Vitamin (MULTIVITAMIN ADULT PO) 1 tablet     ondansetron (ZOFRAN-ODT) 8 MG disintegrating tablet Take 1 tablet (8 mg total) by mouth every 8 (eight) hours as needed for nausea or vomiting. 20 tablet 3   rizatriptan (MAXALT) 10 MG tablet Take 1 tablet earliest onset of migraine.  May repeat in 2 hours if needed.  Maximum 2 tablets in 24 hours 10 tablet 5   tezepelumab-ekko (TEZSPIRE) 210 MG/1.91ML syringe Inject into the skin.     olopatadine (PATADAY) 0.1 %  ophthalmic solution Place 1 drop into both eyes daily as needed for allergies. (Patient not taking: Reported on 07/18/2021) 5 mL 12   Current Facility-Administered Medications  Medication Dose Route Frequency Provider Last Rate Last Admin   tezepelumab-ekko (TEZSPIRE) 210 MG/1.91ML syringe 210 mg  210 mg Subcutaneous Q28 days Kennith Gain, MD   210 mg at 07/11/21 1048     Known medication allergies: Allergies  Allergen Reactions   Albuterol     High HR   Chocolate     Face swelling/sounds like angioedema "I had to go to urgent care today due to an allergic reaction to a chocolate candy bar. My throat swelled up and was closing, my nasal passages were congested, and my face was hurting. They gave me a steroid shot at the clinic, and then they sent me home presidone to take for a week" 06/2019   Lisinopril Other (See Comments)   Other Other (See Comments)    Pain med? Unsure of medication, caused n/v     Physical examination: Blood pressure 110/62, pulse 86, temperature 98.1 F (36.7 C), temperature source Temporal, resp. rate 16, SpO2 95 %.  General: Alert, interactive, in no acute distress. HEENT: PERRLA, TMs pearly gray, turbinates non-edematous without discharge, post-pharynx non erythematous. Neck: Supple without lymphadenopathy. Lungs: Clear to auscultation without wheezing, rhonchi or rales. {no increased work of breathing. CV: Normal S1, S2 without murmurs. Abdomen: Nondistended, nontender. Skin: Warm and dry, without lesions or rashes. Extremities:  No clubbing, cyanosis or edema. Neuro:   Grossly intact.  Diagnositics/Labs: None today  Assessment and plan:   Asthma, severe persistent - Tezspire is not intended to be used as monotherapy (ie by itself)  - Daily controller medication(s): Alvesco 16mcg two puffs twice daily + Tezspire monthly - Rescue medications: Xopenex 4 puffs every 4-6 hours as needed - Changes during respiratory infections or worsening  symptoms: Increase Alvesco 145mcg to 4 puffs twice daily for TWO WEEKS. - Will have second opinion on asthma and control with pulmonology referral - Asthma control goals:  * Full participation in all desired activities (may need albuterol before activity) * Albuterol use two time or less a week on average (not counting use with activity) * Cough interfering with sleep two time or less a month * Oral steroids no more than once a year * No hospitalizations  Allergic rhinitis with conjunctivitis - Continue avoidance measures for grass pollens, weed pollens, tree pollens, molds, dust mite, cat, mouse, mixed feathers - Continue using Allegra 180mg  or Xyzal 5mg  daily as needed  - For watery eyes can use of Pataday 1 drop each eye daily as needed - For nasal congestion use of nasal steroid spray like Flonase no more than 3 times a week  due to nosebleeds.    Allergic reaction - Continue avoidance of tree nuts - Have access to self-injectable epinephrine AuviQ 0.3mg  at all times - Follow emergency action plan in case of allergic reaction  Follow up in 3-4 months or sooner if needed    I appreciate the opportunity to take part in [place] care. Please do not hesitate to contact me with questions.  Sincerely,   Prudy Feeler, MD Allergy/Immunology [facility]

## 2021-08-03 ENCOUNTER — Telehealth: Payer: Self-pay

## 2021-08-03 NOTE — Telephone Encounter (Signed)
-----   Message from [employer] Larose Hires, MD sent at 08/03/2021  4:13 PM EST ----- Yup anyone over there is fine...thats actually where I told him he would be going  ----- Message ----- From: Agapito Games, NT Sent: 08/03/2021   4:09 PM EST To: Marcelyn Bruins, MD     ----- Message ----- From: Marcelyn Bruins, MD Sent: 08/02/2021   1:12 PM EST To: Chadrick Sprinkle A Neal-Mims, NT  Please place a pulmonology referral for severe persistent asthma on biologic therapy, second opinion

## 2021-08-03 NOTE — Progress Notes (Signed)
Hey,  Are you okay with [facility]?  Thanks

## 2021-08-08 ENCOUNTER — Encounter: Payer: Self-pay | Admitting: Pulmonary Disease

## 2021-08-08 ENCOUNTER — Ambulatory Visit: Payer: BC Managed Care – PPO | Admitting: Pulmonary Disease

## 2021-08-08 ENCOUNTER — Ambulatory Visit (INDEPENDENT_AMBULATORY_CARE_PROVIDER_SITE_OTHER): Payer: BC Managed Care – PPO | Admitting: *Deleted

## 2021-08-08 ENCOUNTER — Other Ambulatory Visit: Payer: Self-pay

## 2021-08-08 VITALS — BP 116/72 | HR 92 | Ht 73.0 in

## 2021-08-08 DIAGNOSIS — J4550 Severe persistent asthma, uncomplicated: Secondary | ICD-10-CM | POA: Diagnosis not present

## 2021-08-08 DIAGNOSIS — F4312 Post-traumatic stress disorder, chronic: Secondary | ICD-10-CM | POA: Diagnosis not present

## 2021-08-08 DIAGNOSIS — F411 Generalized anxiety disorder: Secondary | ICD-10-CM | POA: Diagnosis not present

## 2021-08-08 NOTE — Progress Notes (Signed)
Synopsis: Referred in January 2023 for severe persistent asthma by Charmian Muff, MD  Subjective:   PATIENT ID: James Matthews GENDER: male DOB: 01/22/1996, MRN: [id number]  HPI  Chief Complaint  Patient presents with   Consult    Referred by allergy and asthma for history of asthma. Currently on Tezspire. Last flare up was about a week ago.    James Matthews is a 26 year old male, never smoker with history of severe persistent asthma on tezepelumab who is referred to pulmonary clinic for further evaluation.   He has been followed at the Allergy & Asthma clinic for his asthma. He is currently on tezepelumab, alvesco 2 puffs twice daily and levalbuterol as needed. He was previously tried on nucala (mepolizumab) and fasenra (benralizumab). He reports decreased use of the levalbuterol since starting tezepelumab. He has shortness of breath with exertion as his main symptoms at this time. Triggers of his asthma include physical activity, spring allergies, hot/humid days and candles/strong scents. He was most recently on prednisone 2 weeks ago. He typically likes to run or walk for exercise but reports over the last year his physical activity has suffered due to dyspnea from asthma.  He reports he is not able to use ICS/LABA inhalers due to concern for his history of chronic kidney disease due to right hydronephrectomy for PUV/left pyeloplasty at age 81 year, left sided kidney stones and frequent UTIs. He is followed by Dr. Jolinda Croak at [facility]. He reports episode of sepsis due to UTI in 2021.  He has 2 dogs at home. His bedroom has hard flooring. He works as a Arts administrator for [employer].  He is accompanied by hist mother today.   Past Medical History:  Diagnosis Date   Asthma    Hypertension    IBS (irritable bowel syndrome)    probiotic   Orthostatic hypertension    Posterior urethral valves    Premature birth    3 months early   Renal disorder    Verbal apraxia       Family History  Problem Relation Age of Onset   Breast cancer Mother        survivor   Hypertension Mother    Hyperlipidemia Mother    Other Father        pituitary tumor- gamma knife radiation   Heart disease Maternal Grandfather    Prostate cancer Maternal Grandfather        46s   Melanoma Sister    Prostate cancer Paternal Grandfather        27s   Ovarian cancer Maternal Aunt        mets to liver      Social History   Socioeconomic History   Marital status: Single    Spouse name: Not on file   Number of children: Not on file   Years of education: Not on file   Highest education level: Not on file  Occupational History   Not on file  Tobacco Use   Smoking status: Never   Smokeless tobacco: Never  Vaping Use   Vaping Use: Not on file  Substance and Sexual Activity   Alcohol use: No   Drug use: No   Sexual activity: Not on file  Other Topics Concern   Not on file  Social History Narrative   Single. Lives with mom and dad. His brother and wife and nephew also live with him. Brother and sister- healthy.       Full time at  [employer]. Studying psychology with biology minor.    Thinking grad school for psychology- would plan for research over clinical   Right handed   Two story home   Does drink caffeine, one large coffee daily   Social Determinants of Health   Financial Resource Strain: Not on file  Food Insecurity: Not on file  Transportation Needs: Not on file  Physical Activity: Not on file  Stress: Not on file  Social Connections: Not on file  Intimate Partner Violence: Not on file     Allergies  Allergen Reactions   Albuterol     High HR   Chocolate     Face swelling/sounds like angioedema "I had to go to urgent care today due to an allergic reaction to a chocolate candy bar. My throat swelled up and was closing, my nasal passages were congested, and my face was hurting. They gave me a steroid shot at the clinic, and then they sent me home  presidone to take for a week" 06/2019   Lisinopril Other (See Comments)   Other Other (See Comments)    Pain med? Unsure of medication, caused n/v     Outpatient Medications Prior to Visit  Medication Sig Dispense Refill   AIMOVIG 140 MG/ML SOAJ INJECT 1 PEN INTO THE SKIN EVERY 30 DAYS 1 mL 5   ALPRAZolam (XANAX) 1 MG tablet Take 1 mg by mouth 2 (two) times daily as needed for anxiety.      ALVESCO 160 MCG/ACT inhaler Inhale 2 puffs into the lungs 2 (two) times daily. 1 each 5   amitriptyline (ELAVIL) 10 MG tablet Take 10 mg by mouth at bedtime.     busPIRone (BUSPAR) 30 MG tablet TAKE 1 TABLET BY MOUTH TWICE A DAY 180 tablet 1   desvenlafaxine (PRISTIQ) 100 MG 24 hr tablet Take 200 mg by mouth daily.     EPINEPHrine 0.3 mg/0.3 mL IJ SOAJ injection INJECT 0.3 MLS INTO THE MUSCLE ONCE FOR 1 DOSE. AS DIRECTED FOR LIFE-THREATENING ALLERGIC REACTIONS 1 each 1   fexofenadine (ALLEGRA ODT) 30 MG disintegrating tablet See admin instructions.     levalbuterol (XOPENEX HFA) 45 MCG/ACT inhaler INHALE 2 PUFFS INTO THE LUNGS EVERY 4 HOURS AS NEEDED FOR WHEEZE 15 g 1   levalbuterol (XOPENEX) 1.25 MG/3ML nebulizer solution 1 vial via nebulizer every 4-6 hours as needed. 75 mL 1   losartan (COZAAR) 50 MG tablet Take 1.5 tablets (75 mg total) by mouth daily. 45 tablet 5   melatonin 3 MG TABS tablet 1 tablet at bedtime as needed     Multiple Vitamin (MULTIVITAMIN ADULT PO) 1 tablet     ondansetron (ZOFRAN-ODT) 8 MG disintegrating tablet Take 1 tablet (8 mg total) by mouth every 8 (eight) hours as needed for nausea or vomiting. 20 tablet 3   rizatriptan (MAXALT) 10 MG tablet Take 1 tablet earliest onset of migraine.  May repeat in 2 hours if needed.  Maximum 2 tablets in 24 hours 10 tablet 5   tezepelumab-ekko (TEZSPIRE) 210 MG/1.91ML syringe Inject into the skin.     albuterol (VENTOLIN HFA) 108 (90 Base) MCG/ACT inhaler Inhale 2 puffs into the lungs every 6 (six) hours as needed for wheezing or shortness of  breath. 8 g 2   olopatadine (PATADAY) 0.1 % ophthalmic solution Place 1 drop into both eyes daily as needed for allergies. (Patient not taking: Reported on 07/18/2021) 5 mL 12   Facility-Administered Medications Prior to Visit  Medication Dose Route Frequency Provider  Last Rate Last Admin   tezepelumab-ekko (TEZSPIRE) 210 MG/1.91ML syringe 210 mg  210 mg Subcutaneous Q28 days Kennith Gain, MD   210 mg at 08/08/21 1413    Review of Systems  Constitutional:  Negative for chills, fever, malaise/fatigue and weight loss.  HENT:  Negative for congestion, sinus pain and sore throat.   Eyes: Negative.   Respiratory:  Positive for shortness of breath and wheezing. Negative for cough, hemoptysis and sputum production.   Cardiovascular:  Negative for chest pain, palpitations, orthopnea, claudication and leg swelling.  Gastrointestinal:  Negative for abdominal pain, heartburn, nausea and vomiting.  Genitourinary: Negative.   Musculoskeletal:  Negative for joint pain and myalgias.  Skin:  Negative for rash.  Neurological:  Negative for weakness.  Endo/Heme/Allergies: Negative.   Psychiatric/Behavioral: Negative.     Objective:   Vitals:   08/08/21 1142  BP: 116/72  Pulse: 92  SpO2: 98%  Height: 6\' 1"  (1.854 m)   Physical Exam Constitutional:      General: He is not in acute distress. HENT:     Head: Normocephalic and atraumatic.  Eyes:     Extraocular Movements: Extraocular movements intact.     Conjunctiva/sclera: Conjunctivae normal.     Pupils: Pupils are equal, round, and reactive to light.  Cardiovascular:     Rate and Rhythm: Normal rate and regular rhythm.     Pulses: Normal pulses.     Heart sounds: Normal heart sounds. No murmur heard. Pulmonary:     Effort: Pulmonary effort is normal.     Breath sounds: Normal breath sounds.  Abdominal:     General: Bowel sounds are normal.     Palpations: Abdomen is soft.  Musculoskeletal:     Right lower leg: No edema.      Left lower leg: No edema.  Lymphadenopathy:     Cervical: No cervical adenopathy.  Skin:    General: Skin is warm and dry.  Neurological:     General: No focal deficit present.     Mental Status: He is alert.  Psychiatric:        Mood and Affect: Mood normal.        Behavior: Behavior normal.        Thought Content: Thought content normal.        Judgment: Judgment normal.   CBC    Component Value Date/Time   WBC 4.6 12/15/2020 1507   RBC 4.35 12/15/2020 1507   HGB 14.4 12/15/2020 1507   HGB 15.2 11/25/2019 1512   HCT 40.0 12/15/2020 1507   HCT 44.0 11/25/2019 1512   PLT 251.0 12/15/2020 1507   MCV 91.9 12/15/2020 1507   MCV 94 11/25/2019 1512   MCH 32.9 05/06/2020 1421   MCHC 36.0 12/15/2020 1507   RDW 12.2 12/15/2020 1507   RDW 11.6 11/25/2019 1512   LYMPHSABS 2.3 12/15/2020 1507   LYMPHSABS 1.7 11/25/2019 1512   MONOABS 0.6 12/15/2020 1507   EOSABS 0.0 12/15/2020 1507   EOSABS 0.2 11/25/2019 1512   BASOSABS 0.0 12/15/2020 1507   BASOSABS 0.1 11/25/2019 1512   BMP Latest Ref Rng & Units 12/15/2020 11/23/2020 08/25/2020  Glucose 70 - 99 mg/dL 86 89 90  BUN 6 - 23 mg/dL 13 13 15   Creatinine 0.40 - 1.50 mg/dL 1.22 1.33 1.26  BUN/Creat Ratio 6 - 22 (calc) - - -  Sodium 135 - 145 mEq/L 142 142 140  Potassium 3.5 - 5.1 mEq/L 4.0 5.0 4.2  Chloride 96 - 112  mEq/L 102 101 102  CO2 19 - 32 mEq/L 31 31 34(H)  Calcium 8.4 - 10.5 mg/dL 10.1 10.6(H) 10.5   Chest imaging: CXR 05/06/20 The heart size and mediastinal contours are within normal limits. Both lungs are clear. No pleural effusion or pneumothorax. The visualized skeletal structures are unremarkable.  PFT: No flowsheet data found. Spriometry from [facility] 07/18/21 FVC 5.19L FEV1 4.29L  Labs:  Path:  Echo 09/27/20: LV EF 55-60%. RV size and function is normal. No valvular issues.  Heart Catheterization:  Assessment & Plan:   Severe persistent asthma without complication - Plan:  Pulmonary Function Test, CBC with Differential, IgE  Discussion: James Matthews is a 26 year old male, never smoker with history of severe persistent asthma on tezepelumab who is referred to pulmonary clinic for further evaluation.   His asthma symptoms appear controlled at this time on tezepelumab, alvesco 2 puffs twice daily and as needed levalbuterol.   We will check PFTs and CBC with differential and IgE level in 2-4 weeks as he most recently had prednisone 2 weeks ago.  We will check with his nephrology team in the future if we feel we need to add an ICS/LABA/LAMA inhaler regimen.   Follow up in 4 months.  Freda Jackson, MD [facility] Office: [phone]   Current Outpatient Medications:    AIMOVIG 140 MG/ML SOAJ, INJECT 1 PEN INTO THE SKIN EVERY 30 DAYS, Disp: 1 mL, Rfl: 5   ALPRAZolam (XANAX) 1 MG tablet, Take 1 mg by mouth 2 (two) times daily as needed for anxiety. , Disp: , Rfl:    ALVESCO 160 MCG/ACT inhaler, Inhale 2 puffs into the lungs 2 (two) times daily., Disp: 1 each, Rfl: 5   amitriptyline (ELAVIL) 10 MG tablet, Take 10 mg by mouth at bedtime., Disp: , Rfl:    busPIRone (BUSPAR) 30 MG tablet, TAKE 1 TABLET BY MOUTH TWICE A DAY, Disp: 180 tablet, Rfl: 1   desvenlafaxine (PRISTIQ) 100 MG 24 hr tablet, Take 200 mg by mouth daily., Disp: , Rfl:    EPINEPHrine 0.3 mg/0.3 mL IJ SOAJ injection, INJECT 0.3 MLS INTO THE MUSCLE ONCE FOR 1 DOSE. AS DIRECTED FOR LIFE-THREATENING ALLERGIC REACTIONS, Disp: 1 each, Rfl: 1   fexofenadine (ALLEGRA ODT) 30 MG disintegrating tablet, See admin instructions., Disp: , Rfl:    levalbuterol (XOPENEX HFA) 45 MCG/ACT inhaler, INHALE 2 PUFFS INTO THE LUNGS EVERY 4 HOURS AS NEEDED FOR WHEEZE, Disp: 15 g, Rfl: 1   levalbuterol (XOPENEX) 1.25 MG/3ML nebulizer solution, 1 vial via nebulizer every 4-6 hours as needed., Disp: 75 mL, Rfl: 1   losartan (COZAAR) 50 MG tablet, Take 1.5 tablets (75 mg total) by mouth daily., Disp: 45  tablet, Rfl: 5   melatonin 3 MG TABS tablet, 1 tablet at bedtime as needed, Disp: , Rfl:    Multiple Vitamin (MULTIVITAMIN ADULT PO), 1 tablet, Disp: , Rfl:    ondansetron (ZOFRAN-ODT) 8 MG disintegrating tablet, Take 1 tablet (8 mg total) by mouth every 8 (eight) hours as needed for nausea or vomiting., Disp: 20 tablet, Rfl: 3   rizatriptan (MAXALT) 10 MG tablet, Take 1 tablet earliest onset of migraine.  May repeat in 2 hours if needed.  Maximum 2 tablets in 24 hours, Disp: 10 tablet, Rfl: 5   tezepelumab-ekko (TEZSPIRE) 210 MG/1.91ML syringe, Inject into the skin., Disp: , Rfl:   Current Facility-Administered Medications:    tezepelumab-ekko (TEZSPIRE) 210 MG/1.91ML syringe 210 mg, 210 mg, Subcutaneous,  Q28 days, Kennith Gain, MD, 210 mg at 08/08/21 1413

## 2021-08-08 NOTE — Patient Instructions (Signed)
We will schedule you for pulmonary function tests in the next 2-4 weeks.   When you return for pulmonary function tests, we will check labs  Continue alvesco 2 puffs twice daily  Continue levalbuterol as needed  Follow up in 4 months

## 2021-08-15 DIAGNOSIS — F4312 Post-traumatic stress disorder, chronic: Secondary | ICD-10-CM | POA: Diagnosis not present

## 2021-08-15 DIAGNOSIS — D2262 Melanocytic nevi of left upper limb, including shoulder: Secondary | ICD-10-CM | POA: Diagnosis not present

## 2021-08-15 DIAGNOSIS — R52 Pain, unspecified: Secondary | ICD-10-CM | POA: Diagnosis not present

## 2021-08-15 DIAGNOSIS — D225 Melanocytic nevi of trunk: Secondary | ICD-10-CM | POA: Diagnosis not present

## 2021-08-15 DIAGNOSIS — F411 Generalized anxiety disorder: Secondary | ICD-10-CM | POA: Diagnosis not present

## 2021-08-15 DIAGNOSIS — D224 Melanocytic nevi of scalp and neck: Secondary | ICD-10-CM | POA: Diagnosis not present

## 2021-08-18 DIAGNOSIS — F411 Generalized anxiety disorder: Secondary | ICD-10-CM | POA: Diagnosis not present

## 2021-08-18 DIAGNOSIS — F4312 Post-traumatic stress disorder, chronic: Secondary | ICD-10-CM | POA: Diagnosis not present

## 2021-08-22 ENCOUNTER — Ambulatory Visit (INDEPENDENT_AMBULATORY_CARE_PROVIDER_SITE_OTHER): Payer: BC Managed Care – PPO | Admitting: Pulmonary Disease

## 2021-08-22 ENCOUNTER — Other Ambulatory Visit: Payer: Self-pay

## 2021-08-22 DIAGNOSIS — J4550 Severe persistent asthma, uncomplicated: Secondary | ICD-10-CM | POA: Diagnosis not present

## 2021-08-22 DIAGNOSIS — F411 Generalized anxiety disorder: Secondary | ICD-10-CM | POA: Diagnosis not present

## 2021-08-22 DIAGNOSIS — F4312 Post-traumatic stress disorder, chronic: Secondary | ICD-10-CM | POA: Diagnosis not present

## 2021-08-22 LAB — PULMONARY FUNCTION TEST
DL/VA % pred: 75 %
DL/VA: 3.79 ml/min/mmHg/L
DLCO cor % pred: 82 %
DLCO cor: 29.92 ml/min/mmHg
DLCO unc % pred: 82 %
DLCO unc: 29.92 ml/min/mmHg
FEF 25-75 Post: 6.02 L/sec
FEF 25-75 Pre: 5.57 L/sec
FEF2575-%Change-Post: 8 %
FEF2575-%Pred-Post: 120 %
FEF2575-%Pred-Pre: 111 %
FEV1-%Change-Post: 1 %
FEV1-%Pred-Post: 109 %
FEV1-%Pred-Pre: 107 %
FEV1-Post: 5.43 L
FEV1-Pre: 5.34 L
FEV1FVC-%Change-Post: 3 %
FEV1FVC-%Pred-Pre: 100 %
FEV6-%Change-Post: -1 %
FEV6-%Pred-Post: 104 %
FEV6-%Pred-Pre: 106 %
FEV6-Post: 6.27 L
FEV6-Pre: 6.4 L
FEV6FVC-%Pred-Post: 100 %
FEV6FVC-%Pred-Pre: 100 %
FVC-%Change-Post: -1 %
FVC-%Pred-Post: 103 %
FVC-%Pred-Pre: 105 %
FVC-Post: 6.27 L
FVC-Pre: 6.4 L
Post FEV1/FVC ratio: 87 %
Post FEV6/FVC ratio: 100 %
Pre FEV1/FVC ratio: 84 %
Pre FEV6/FVC Ratio: 100 %
RV % pred: 116 %
RV: 1.96 L
TLC % pred: 110 %
TLC: 8.28 L

## 2021-08-22 NOTE — Progress Notes (Signed)
PFT done today. 

## 2021-08-23 LAB — CBC WITH DIFFERENTIAL/PLATELET
Basophils Absolute: 0.1 10*3/uL (ref 0.0–0.1)
Basophils Relative: 1.2 % (ref 0.0–3.0)
Eosinophils Absolute: 0.2 10*3/uL (ref 0.0–0.7)
Eosinophils Relative: 2.5 % (ref 0.0–5.0)
HCT: 41.4 % (ref 39.0–52.0)
Hemoglobin: 14.1 g/dL (ref 13.0–17.0)
Lymphocytes Relative: 50.3 % — ABNORMAL HIGH (ref 12.0–46.0)
Lymphs Abs: 3.2 10*3/uL (ref 0.7–4.0)
MCHC: 34 g/dL (ref 30.0–36.0)
MCV: 93.8 fl (ref 78.0–100.0)
Monocytes Absolute: 0.4 10*3/uL (ref 0.1–1.0)
Monocytes Relative: 6.8 % (ref 3.0–12.0)
Neutro Abs: 2.5 10*3/uL (ref 1.4–7.7)
Neutrophils Relative %: 39.2 % — ABNORMAL LOW (ref 43.0–77.0)
Platelets: 304 10*3/uL (ref 150.0–400.0)
RBC: 4.42 Mil/uL (ref 4.22–5.81)
RDW: 12.6 % (ref 11.5–15.5)
WBC: 6.4 10*3/uL (ref 4.0–10.5)

## 2021-08-23 LAB — IGE: IgE (Immunoglobulin E), Serum: 24 kU/L (ref <=114)

## 2021-08-29 DIAGNOSIS — F411 Generalized anxiety disorder: Secondary | ICD-10-CM | POA: Diagnosis not present

## 2021-08-29 DIAGNOSIS — F4312 Post-traumatic stress disorder, chronic: Secondary | ICD-10-CM | POA: Diagnosis not present

## 2021-08-30 DIAGNOSIS — J4550 Severe persistent asthma, uncomplicated: Secondary | ICD-10-CM | POA: Diagnosis not present

## 2021-08-31 ENCOUNTER — Ambulatory Visit: Payer: BC Managed Care – PPO | Admitting: Allergy

## 2021-09-01 DIAGNOSIS — F419 Anxiety disorder, unspecified: Secondary | ICD-10-CM | POA: Diagnosis not present

## 2021-09-01 DIAGNOSIS — F902 Attention-deficit hyperactivity disorder, combined type: Secondary | ICD-10-CM | POA: Diagnosis not present

## 2021-09-01 DIAGNOSIS — Z79899 Other long term (current) drug therapy: Secondary | ICD-10-CM | POA: Diagnosis not present

## 2021-09-04 ENCOUNTER — Encounter: Payer: Self-pay | Admitting: Allergy

## 2021-09-05 ENCOUNTER — Ambulatory Visit (INDEPENDENT_AMBULATORY_CARE_PROVIDER_SITE_OTHER): Payer: BC Managed Care – PPO

## 2021-09-05 ENCOUNTER — Other Ambulatory Visit: Payer: Self-pay

## 2021-09-05 DIAGNOSIS — J4550 Severe persistent asthma, uncomplicated: Secondary | ICD-10-CM | POA: Diagnosis not present

## 2021-09-05 DIAGNOSIS — F411 Generalized anxiety disorder: Secondary | ICD-10-CM | POA: Diagnosis not present

## 2021-09-05 DIAGNOSIS — F908 Attention-deficit hyperactivity disorder, other type: Secondary | ICD-10-CM | POA: Diagnosis not present

## 2021-09-05 DIAGNOSIS — F4312 Post-traumatic stress disorder, chronic: Secondary | ICD-10-CM | POA: Diagnosis not present

## 2021-09-05 DIAGNOSIS — F422 Mixed obsessional thoughts and acts: Secondary | ICD-10-CM | POA: Diagnosis not present

## 2021-09-06 DIAGNOSIS — F411 Generalized anxiety disorder: Secondary | ICD-10-CM | POA: Diagnosis not present

## 2021-09-06 DIAGNOSIS — F908 Attention-deficit hyperactivity disorder, other type: Secondary | ICD-10-CM | POA: Diagnosis not present

## 2021-09-06 DIAGNOSIS — F330 Major depressive disorder, recurrent, mild: Secondary | ICD-10-CM | POA: Diagnosis not present

## 2021-09-06 DIAGNOSIS — G4700 Insomnia, unspecified: Secondary | ICD-10-CM | POA: Diagnosis not present

## 2021-09-06 DIAGNOSIS — F422 Mixed obsessional thoughts and acts: Secondary | ICD-10-CM | POA: Diagnosis not present

## 2021-09-06 DIAGNOSIS — F4312 Post-traumatic stress disorder, chronic: Secondary | ICD-10-CM | POA: Diagnosis not present

## 2021-09-06 DIAGNOSIS — F4310 Post-traumatic stress disorder, unspecified: Secondary | ICD-10-CM | POA: Diagnosis not present

## 2021-09-12 DIAGNOSIS — F908 Attention-deficit hyperactivity disorder, other type: Secondary | ICD-10-CM | POA: Diagnosis not present

## 2021-09-12 DIAGNOSIS — F411 Generalized anxiety disorder: Secondary | ICD-10-CM | POA: Diagnosis not present

## 2021-09-12 DIAGNOSIS — F4312 Post-traumatic stress disorder, chronic: Secondary | ICD-10-CM | POA: Diagnosis not present

## 2021-09-12 DIAGNOSIS — F422 Mixed obsessional thoughts and acts: Secondary | ICD-10-CM | POA: Diagnosis not present

## 2021-09-19 DIAGNOSIS — F908 Attention-deficit hyperactivity disorder, other type: Secondary | ICD-10-CM | POA: Diagnosis not present

## 2021-09-19 DIAGNOSIS — F422 Mixed obsessional thoughts and acts: Secondary | ICD-10-CM | POA: Diagnosis not present

## 2021-09-19 DIAGNOSIS — F4312 Post-traumatic stress disorder, chronic: Secondary | ICD-10-CM | POA: Diagnosis not present

## 2021-09-19 DIAGNOSIS — F411 Generalized anxiety disorder: Secondary | ICD-10-CM | POA: Diagnosis not present

## 2021-09-25 DIAGNOSIS — N182 Chronic kidney disease, stage 2 (mild): Secondary | ICD-10-CM | POA: Diagnosis not present

## 2021-09-25 DIAGNOSIS — F413 Other mixed anxiety disorders: Secondary | ICD-10-CM | POA: Diagnosis not present

## 2021-09-25 DIAGNOSIS — Z8639 Personal history of other endocrine, nutritional and metabolic disease: Secondary | ICD-10-CM | POA: Diagnosis not present

## 2021-09-25 DIAGNOSIS — E539 Vitamin B deficiency, unspecified: Secondary | ICD-10-CM | POA: Diagnosis not present

## 2021-09-25 DIAGNOSIS — F3341 Major depressive disorder, recurrent, in partial remission: Secondary | ICD-10-CM | POA: Diagnosis not present

## 2021-09-25 DIAGNOSIS — J4550 Severe persistent asthma, uncomplicated: Secondary | ICD-10-CM | POA: Diagnosis not present

## 2021-09-26 DIAGNOSIS — F411 Generalized anxiety disorder: Secondary | ICD-10-CM | POA: Diagnosis not present

## 2021-09-26 DIAGNOSIS — F4312 Post-traumatic stress disorder, chronic: Secondary | ICD-10-CM | POA: Diagnosis not present

## 2021-09-26 DIAGNOSIS — F422 Mixed obsessional thoughts and acts: Secondary | ICD-10-CM | POA: Diagnosis not present

## 2021-09-26 DIAGNOSIS — F908 Attention-deficit hyperactivity disorder, other type: Secondary | ICD-10-CM | POA: Diagnosis not present

## 2021-09-28 ENCOUNTER — Ambulatory Visit: Payer: BC Managed Care – PPO | Admitting: Allergy

## 2021-09-29 DIAGNOSIS — Q642 Congenital posterior urethral valves: Secondary | ICD-10-CM | POA: Diagnosis not present

## 2021-09-29 DIAGNOSIS — N319 Neuromuscular dysfunction of bladder, unspecified: Secondary | ICD-10-CM | POA: Diagnosis not present

## 2021-09-29 DIAGNOSIS — Z905 Acquired absence of kidney: Secondary | ICD-10-CM | POA: Diagnosis not present

## 2021-09-29 DIAGNOSIS — N1339 Other hydronephrosis: Secondary | ICD-10-CM | POA: Diagnosis not present

## 2021-10-01 ENCOUNTER — Other Ambulatory Visit: Payer: Self-pay | Admitting: Allergy

## 2021-10-03 DIAGNOSIS — F422 Mixed obsessional thoughts and acts: Secondary | ICD-10-CM | POA: Diagnosis not present

## 2021-10-03 DIAGNOSIS — F4312 Post-traumatic stress disorder, chronic: Secondary | ICD-10-CM | POA: Diagnosis not present

## 2021-10-03 DIAGNOSIS — F908 Attention-deficit hyperactivity disorder, other type: Secondary | ICD-10-CM | POA: Diagnosis not present

## 2021-10-03 DIAGNOSIS — F411 Generalized anxiety disorder: Secondary | ICD-10-CM | POA: Diagnosis not present

## 2021-10-05 ENCOUNTER — Other Ambulatory Visit (HOSPITAL_COMMUNITY): Payer: Self-pay

## 2021-10-05 DIAGNOSIS — F4312 Post-traumatic stress disorder, chronic: Secondary | ICD-10-CM | POA: Diagnosis not present

## 2021-10-05 DIAGNOSIS — F908 Attention-deficit hyperactivity disorder, other type: Secondary | ICD-10-CM | POA: Diagnosis not present

## 2021-10-05 DIAGNOSIS — F411 Generalized anxiety disorder: Secondary | ICD-10-CM | POA: Diagnosis not present

## 2021-10-05 DIAGNOSIS — F422 Mixed obsessional thoughts and acts: Secondary | ICD-10-CM | POA: Diagnosis not present

## 2021-10-06 ENCOUNTER — Ambulatory Visit (INDEPENDENT_AMBULATORY_CARE_PROVIDER_SITE_OTHER): Payer: BC Managed Care – PPO

## 2021-10-06 ENCOUNTER — Telehealth (HOSPITAL_COMMUNITY): Payer: Self-pay | Admitting: Pharmacy Technician

## 2021-10-06 DIAGNOSIS — J4550 Severe persistent asthma, uncomplicated: Secondary | ICD-10-CM | POA: Diagnosis not present

## 2021-10-06 NOTE — Telephone Encounter (Signed)
Patient Advocate Encounter ?  ?Received notification that prior authorization for Aimovig 140mg /ml auto-injectors is required. ?  ?PA submitted on 10/06/2021 ?Key [id number] ?Status is pending ?   ? ? ? ?10/08/2021, CPhT ?Pharmacy Patient Advocate Specialist ?[facility] Patient Advocate Team ?Direct Number: [phone]  Fax: [phone]  ?

## 2021-10-09 NOTE — Telephone Encounter (Signed)
Patient Advocate Encounter ? ?Prior Authorization for Aimovig 140MG /ML auto-injectors has been approved.   ? ? ?Effective dates: 10/06/2021 through 10/05/2022 ? ? ? ? ? ?10/07/2022, CPhT ?Pharmacy Patient Advocate Specialist ?[facility] Patient Advocate Team ?Direct Number: [phone]  Fax: [phone]  ?

## 2021-10-10 DIAGNOSIS — F908 Attention-deficit hyperactivity disorder, other type: Secondary | ICD-10-CM | POA: Diagnosis not present

## 2021-10-10 DIAGNOSIS — F902 Attention-deficit hyperactivity disorder, combined type: Secondary | ICD-10-CM | POA: Diagnosis not present

## 2021-10-10 DIAGNOSIS — F422 Mixed obsessional thoughts and acts: Secondary | ICD-10-CM | POA: Diagnosis not present

## 2021-10-10 DIAGNOSIS — Z79899 Other long term (current) drug therapy: Secondary | ICD-10-CM | POA: Diagnosis not present

## 2021-10-10 DIAGNOSIS — F419 Anxiety disorder, unspecified: Secondary | ICD-10-CM | POA: Diagnosis not present

## 2021-10-10 DIAGNOSIS — F4312 Post-traumatic stress disorder, chronic: Secondary | ICD-10-CM | POA: Diagnosis not present

## 2021-10-10 DIAGNOSIS — F411 Generalized anxiety disorder: Secondary | ICD-10-CM | POA: Diagnosis not present

## 2021-10-17 DIAGNOSIS — F908 Attention-deficit hyperactivity disorder, other type: Secondary | ICD-10-CM | POA: Diagnosis not present

## 2021-10-17 DIAGNOSIS — F411 Generalized anxiety disorder: Secondary | ICD-10-CM | POA: Diagnosis not present

## 2021-10-17 DIAGNOSIS — F422 Mixed obsessional thoughts and acts: Secondary | ICD-10-CM | POA: Diagnosis not present

## 2021-10-17 DIAGNOSIS — F4312 Post-traumatic stress disorder, chronic: Secondary | ICD-10-CM | POA: Diagnosis not present

## 2021-10-19 ENCOUNTER — Ambulatory Visit: Payer: BC Managed Care – PPO | Admitting: Allergy

## 2021-10-19 DIAGNOSIS — J309 Allergic rhinitis, unspecified: Secondary | ICD-10-CM

## 2021-10-24 DIAGNOSIS — F422 Mixed obsessional thoughts and acts: Secondary | ICD-10-CM | POA: Diagnosis not present

## 2021-10-24 DIAGNOSIS — F411 Generalized anxiety disorder: Secondary | ICD-10-CM | POA: Diagnosis not present

## 2021-10-24 DIAGNOSIS — F4312 Post-traumatic stress disorder, chronic: Secondary | ICD-10-CM | POA: Diagnosis not present

## 2021-10-24 DIAGNOSIS — F908 Attention-deficit hyperactivity disorder, other type: Secondary | ICD-10-CM | POA: Diagnosis not present

## 2021-10-30 DIAGNOSIS — F422 Mixed obsessional thoughts and acts: Secondary | ICD-10-CM | POA: Diagnosis not present

## 2021-10-30 DIAGNOSIS — F4312 Post-traumatic stress disorder, chronic: Secondary | ICD-10-CM | POA: Diagnosis not present

## 2021-10-30 DIAGNOSIS — J4550 Severe persistent asthma, uncomplicated: Secondary | ICD-10-CM | POA: Diagnosis not present

## 2021-10-30 DIAGNOSIS — F411 Generalized anxiety disorder: Secondary | ICD-10-CM | POA: Diagnosis not present

## 2021-10-30 DIAGNOSIS — F908 Attention-deficit hyperactivity disorder, other type: Secondary | ICD-10-CM | POA: Diagnosis not present

## 2021-11-02 ENCOUNTER — Other Ambulatory Visit: Payer: Self-pay | Admitting: Neurology

## 2021-11-03 ENCOUNTER — Ambulatory Visit (INDEPENDENT_AMBULATORY_CARE_PROVIDER_SITE_OTHER): Payer: BC Managed Care – PPO | Admitting: *Deleted

## 2021-11-03 DIAGNOSIS — J4550 Severe persistent asthma, uncomplicated: Secondary | ICD-10-CM | POA: Diagnosis not present

## 2021-11-07 DIAGNOSIS — F411 Generalized anxiety disorder: Secondary | ICD-10-CM | POA: Diagnosis not present

## 2021-11-07 DIAGNOSIS — F4312 Post-traumatic stress disorder, chronic: Secondary | ICD-10-CM | POA: Diagnosis not present

## 2021-11-07 DIAGNOSIS — F908 Attention-deficit hyperactivity disorder, other type: Secondary | ICD-10-CM | POA: Diagnosis not present

## 2021-11-07 DIAGNOSIS — F422 Mixed obsessional thoughts and acts: Secondary | ICD-10-CM | POA: Diagnosis not present

## 2021-11-08 NOTE — Progress Notes (Deleted)
NEUROLOGY FOLLOW UP OFFICE NOTE  James Matthews [id number]  Assessment/Plan:   Migraine without aura without status migrainosus, not intractable    Migraine prevention:  Aimovig 140mg  every 28 days Migraine rescue:  Tylenol (may use rizatriptan 10mg  if needed), Zofran Limit use of pain relievers to no more than 2 days out of week to prevent risk of rebound or medication-overuse headache. Keep headache diary Follow up 9 months     Subjective:  C. Longman is a 26 year old male with HTN, CKD and asthma who follows up for migraines.James Matthews  He is accompanied by his mother who supplements history.   UPDATE: *** Intensity:  Moderate to severe Duration:  1 hour with acetaminophen Frequency:  2 in past 30 days      Current NSAIDS:  none Current analgesics:  Tylenol  Current triptans:  rizatriptan 10mg  (has not needed - treating with Tylenol instead).   Current ergotamine:  none Current anti-emetic:  Zofran ODT 8mg  Current muscle relaxants:  none Current anti-anxiolytic:  Clonazepam; busipirone Current sleep aide:  none Current Antihypertensive medications:  none Current Antidepressant medications:  Pristiq Current Anticonvulsant medications:  none Current anti-CGRP:  Aimovig 140mg  Current Vitamins/Herbal/Supplements:  none Current Antihistamines/Decongestants:  Astelin Other therapy:  none Hormone/birth control:  none   Caffeine:  1 cup coffee daily. Diet:  2 liters water daily.  No soda. Exercise:  Once a week Depression:  Yes but stable; Anxiety:  Yes but stable Other pain:  no Sleep hygiene:  8 hours of sleep a night.  Previously poor   HISTORY:  He has history of migraines since childhood but have gotten worse about 2 months ago.  He has no explanation for increased frequency.  They are severe right worse than left parietal pressure-like headaches. They are associated with nausea, visual disturbance (red dots) photophobia and phonophobia.  They last from  several hours to several days.  They have been occurring daily (previously occurring 3 to 4 times a week).  Triggers include light and sound.  Due to his speech disorder, prolonged talking may be a trigger.  Laying down to rest in dark and quiet room helps.  Prednisone taper was ineffective after it was finished.  MRI of brain with and without contrast on 08/06/2019 was normal.   Remote CT head without contrast from 02/19/2007 to evaluate headache was personally reviewed and demonstrated incidental findings consistent with right ethmoid sinusitis but no acute intracranial abnormality.  Patient has history of syncope and orthostatic hypotension.  There is associated confusion and blurred vision.  Also reports profuse sweating.  Evaluated by cardiology.  Echo and 14 day event monitor unremarkable.  his nephrologist took him off losartan and symptoms resolved.   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  No known cause but possibly genetic.  He was born 3 months premature.   Past NSAIDS/steroids:  Prednisone. Unable to take NSAIDs due to renal disorder. Past analgesics:  none Past abortive triptans:  sumatriptan (ineffective) Past abortive ergotamine:  none Past muscle relaxants:  none Past anti-emetic:  Zofran ODT 4mg  Past antihypertensive medications:  lisinopril Past antidepressant/antipsychotic/mood medications:  Fluoxetine, mirtazepine, Seroquel, trazodone Past anticonvulsant medications:  Gabapentin 600mg  Past anti-CGRP:  nonw Past vitamins/Herbal/Supplements:  none Past antihistamines/decongestants:  none Other past therapies:  none     Family history of headache:  Mom (migraines); brother (migraines); sister (migraines)  PAST MEDICAL HISTORY: Past Medical History:  Diagnosis Date   Asthma    Hypertension  IBS (irritable bowel syndrome)    probiotic   Orthostatic hypertension    Posterior urethral valves    Premature birth    3 months early   Renal disorder    Verbal  apraxia     MEDICATIONS: Current Outpatient Medications on File Prior to Visit  Medication Sig Dispense Refill   AIMOVIG 140 MG/ML SOAJ INJECT 1 PEN INTO THE SKIN EVERY 30 DAYS 1 mL 1   ALPRAZolam (XANAX) 1 MG tablet Take 1 mg by mouth 2 (two) times daily as needed for anxiety.      ALVESCO 160 MCG/ACT inhaler Inhale 2 puffs into the lungs 2 (two) times daily. 1 each 5   amitriptyline (ELAVIL) 10 MG tablet Take 10 mg by mouth at bedtime.     busPIRone (BUSPAR) 30 MG tablet TAKE 1 TABLET BY MOUTH TWICE A DAY 180 tablet 1   desvenlafaxine (PRISTIQ) 100 MG 24 hr tablet Take 200 mg by mouth daily.     EPINEPHrine 0.3 mg/0.3 mL IJ SOAJ injection INJECT 0.3 MLS INTO THE MUSCLE ONCE FOR 1 DOSE. AS DIRECTED FOR LIFE-THREATENING ALLERGIC REACTIONS 1 each 1   fexofenadine (ALLEGRA ODT) 30 MG disintegrating tablet See admin instructions.     levalbuterol (XOPENEX HFA) 45 MCG/ACT inhaler INHALE 2 PUFFS INTO THE LUNGS EVERY 4 HOURS AS NEEDED FOR WHEEZE 15 each 1   levalbuterol (XOPENEX) 1.25 MG/3ML nebulizer solution 1 vial via nebulizer every 4-6 hours as needed. 75 mL 1   losartan (COZAAR) 50 MG tablet Take 1.5 tablets (75 mg total) by mouth daily. 45 tablet 5   melatonin 3 MG TABS tablet 1 tablet at bedtime as needed     Multiple Vitamin (MULTIVITAMIN ADULT PO) 1 tablet     ondansetron (ZOFRAN-ODT) 8 MG disintegrating tablet Take 1 tablet (8 mg total) by mouth every 8 (eight) hours as needed for nausea or vomiting. 20 tablet 3   rizatriptan (MAXALT) 10 MG tablet Take 1 tablet earliest onset of migraine.  May repeat in 2 hours if needed.  Maximum 2 tablets in 24 hours 10 tablet 5   tezepelumab-ekko (TEZSPIRE) 210 MG/1. syringe Inject into the skin.     Current Facility-Administered Medications on File Prior to Visit  Medication Dose Route Frequency Provider Last Rate Last Admin   tezepelumab-ekko (TEZSPIRE) 210 MG/1. syringe 210 mg  210 mg Subcutaneous Q28 days Marcelyn Bruins, MD    210 mg at 11/03/21 1415    ALLERGIES: Allergies  Allergen Reactions   Albuterol     High HR   Chocolate     Face swelling/sounds like angioedema "I had to go to urgent care today due to an allergic reaction to a chocolate candy bar. My throat swelled up and was closing, my nasal passages were congested, and my face was hurting. They gave me a steroid shot at the clinic, and then they sent me home presidone to take for a week" 06/2019   Lisinopril Other (See Comments)   Other Other (See Comments)    Pain med? Unsure of medication, caused n/v    FAMILY HISTORY: Family History  Problem Relation Age of Onset   Breast cancer Mother        survivor   Hypertension Mother    Hyperlipidemia Mother    Other Father        pituitary tumor- gamma knife radiation   Heart disease Maternal Grandfather    Prostate cancer Maternal Grandfather        68s  Melanoma Sister    Prostate cancer Paternal Grandfather        5150s   Ovarian cancer Maternal Aunt        mets to liver       Objective:  *** General: No acute distress.  Patient appears ***-groomed.   Head:  Normocephalic/atraumatic Eyes:  Fundi examined but not visualized Neck: supple, no paraspinal tenderness, full range of motion Heart:  Regular rate and rhythm Lungs:  Clear to auscultation bilaterally Back: No paraspinal tenderness Neurological Exam: alert and oriented to person, place, and time.  Speech fluent and not dysarthric, language intact.  CN II-XII intact. Bulk and tone normal, muscle strength 5/5 throughout.  Sensation to light touch intact.  Deep tendon reflexes 2+ throughout, toes downgoing.  Finger to nose testing intact.  Gait normal, Romberg negative.   Shon MilletAdam Cy Bresee, DO  CC: ***

## 2021-11-10 ENCOUNTER — Ambulatory Visit: Payer: BC Managed Care – PPO | Admitting: Neurology

## 2021-11-10 ENCOUNTER — Encounter: Payer: Self-pay | Admitting: Neurology

## 2021-11-10 DIAGNOSIS — Z029 Encounter for administrative examinations, unspecified: Secondary | ICD-10-CM

## 2021-11-14 DIAGNOSIS — F4312 Post-traumatic stress disorder, chronic: Secondary | ICD-10-CM | POA: Diagnosis not present

## 2021-11-14 DIAGNOSIS — F422 Mixed obsessional thoughts and acts: Secondary | ICD-10-CM | POA: Diagnosis not present

## 2021-11-14 DIAGNOSIS — F411 Generalized anxiety disorder: Secondary | ICD-10-CM | POA: Diagnosis not present

## 2021-11-14 DIAGNOSIS — F908 Attention-deficit hyperactivity disorder, other type: Secondary | ICD-10-CM | POA: Diagnosis not present

## 2021-11-21 DIAGNOSIS — F422 Mixed obsessional thoughts and acts: Secondary | ICD-10-CM | POA: Diagnosis not present

## 2021-11-21 DIAGNOSIS — F908 Attention-deficit hyperactivity disorder, other type: Secondary | ICD-10-CM | POA: Diagnosis not present

## 2021-11-21 DIAGNOSIS — F411 Generalized anxiety disorder: Secondary | ICD-10-CM | POA: Diagnosis not present

## 2021-11-21 DIAGNOSIS — F4312 Post-traumatic stress disorder, chronic: Secondary | ICD-10-CM | POA: Diagnosis not present

## 2021-11-27 ENCOUNTER — Telehealth: Payer: Self-pay

## 2021-11-27 ENCOUNTER — Other Ambulatory Visit: Payer: Self-pay | Admitting: Allergy

## 2021-11-27 DIAGNOSIS — J4550 Severe persistent asthma, uncomplicated: Secondary | ICD-10-CM | POA: Diagnosis not present

## 2021-11-27 DIAGNOSIS — F4312 Post-traumatic stress disorder, chronic: Secondary | ICD-10-CM | POA: Diagnosis not present

## 2021-11-27 DIAGNOSIS — F908 Attention-deficit hyperactivity disorder, other type: Secondary | ICD-10-CM | POA: Diagnosis not present

## 2021-11-27 DIAGNOSIS — F411 Generalized anxiety disorder: Secondary | ICD-10-CM | POA: Diagnosis not present

## 2021-11-27 DIAGNOSIS — F422 Mixed obsessional thoughts and acts: Secondary | ICD-10-CM | POA: Diagnosis not present

## 2021-11-27 NOTE — Telephone Encounter (Signed)
I have already spoke to [facility] and they have to fill due to their name on approval. L/m advising patient of same

## 2021-11-27 NOTE — Telephone Encounter (Signed)
Patient called into the office stating that his insurance called the specialty pharmacy stating that his Dorothea Ogle will need to be sent to a local [facility] in order for the insurance to cover it. The pharmacy then called the patient to let him know of this. Patient states that [facility] local [facility] is [address] [place], [place] [address]. Please advise as to what needs to be done for the patient to be able to get his medication.

## 2021-11-28 DIAGNOSIS — F908 Attention-deficit hyperactivity disorder, other type: Secondary | ICD-10-CM | POA: Diagnosis not present

## 2021-11-28 DIAGNOSIS — F4312 Post-traumatic stress disorder, chronic: Secondary | ICD-10-CM | POA: Diagnosis not present

## 2021-11-28 DIAGNOSIS — F411 Generalized anxiety disorder: Secondary | ICD-10-CM | POA: Diagnosis not present

## 2021-11-28 DIAGNOSIS — F422 Mixed obsessional thoughts and acts: Secondary | ICD-10-CM | POA: Diagnosis not present

## 2021-11-30 ENCOUNTER — Ambulatory Visit (INDEPENDENT_AMBULATORY_CARE_PROVIDER_SITE_OTHER): Payer: BC Managed Care – PPO

## 2021-11-30 DIAGNOSIS — J4550 Severe persistent asthma, uncomplicated: Secondary | ICD-10-CM

## 2021-12-01 ENCOUNTER — Ambulatory Visit: Payer: BC Managed Care – PPO

## 2021-12-05 ENCOUNTER — Telehealth (HOSPITAL_COMMUNITY): Payer: Self-pay | Admitting: Pharmacy Technician

## 2021-12-05 ENCOUNTER — Other Ambulatory Visit (HOSPITAL_COMMUNITY): Payer: Self-pay

## 2021-12-05 NOTE — Telephone Encounter (Signed)
Patient Advocate Encounter  Prior Authorization for Aimovig 140MG /ML auto-injectors has been approved.    PA# Effective dates: 12/05/2021 through 12/04/2022     12/06/2022, CPhT Pharmacy Patient Advocate Specialist [facility] Patient Advocate Team Direct Number: [phone]  Fax: [phone]

## 2021-12-05 NOTE — Telephone Encounter (Signed)
Patient Advocate Encounter   Received notification that prior authorization for Aimovig 140MG /ML auto-injectors is required.   PA submitted on 12/05/2021 Key [id number] Status is pending       12/07/2021, CPhT Pharmacy Patient Advocate Specialist [facility] Patient Advocate Team Direct Number: [phone]  Fax: [phone]

## 2021-12-06 ENCOUNTER — Ambulatory Visit (INDEPENDENT_AMBULATORY_CARE_PROVIDER_SITE_OTHER): Payer: BC Managed Care – PPO | Admitting: Allergy

## 2021-12-06 ENCOUNTER — Encounter: Payer: Self-pay | Admitting: Allergy

## 2021-12-06 ENCOUNTER — Other Ambulatory Visit: Payer: Self-pay

## 2021-12-06 ENCOUNTER — Encounter: Payer: Self-pay | Admitting: Neurology

## 2021-12-06 VITALS — BP 122/74 | HR 118 | Temp 98.1°F | Resp 18

## 2021-12-06 DIAGNOSIS — J3089 Other allergic rhinitis: Secondary | ICD-10-CM

## 2021-12-06 DIAGNOSIS — F422 Mixed obsessional thoughts and acts: Secondary | ICD-10-CM | POA: Diagnosis not present

## 2021-12-06 DIAGNOSIS — F4312 Post-traumatic stress disorder, chronic: Secondary | ICD-10-CM | POA: Diagnosis not present

## 2021-12-06 DIAGNOSIS — T7800XA Anaphylactic reaction due to unspecified food, initial encounter: Secondary | ICD-10-CM

## 2021-12-06 DIAGNOSIS — F411 Generalized anxiety disorder: Secondary | ICD-10-CM | POA: Diagnosis not present

## 2021-12-06 DIAGNOSIS — J4550 Severe persistent asthma, uncomplicated: Secondary | ICD-10-CM

## 2021-12-06 DIAGNOSIS — F908 Attention-deficit hyperactivity disorder, other type: Secondary | ICD-10-CM | POA: Diagnosis not present

## 2021-12-06 MED ORDER — AIMOVIG 140 MG/ML ~~LOC~~ SOAJ: SUBCUTANEOUS | 4 refills | Status: DC

## 2021-12-06 NOTE — Patient Instructions (Addendum)
Asthma  - Daily controller medication(s): Alvesco 153mcg two puffs twice daily + Tezspire monthly.  Urged the importance of taking Alvesco on a consistent basis for maintenance - Rescue medications: Xopenex 4 puffs every 4-6 hours as needed - Changes during respiratory infections or worsening symptoms: Increase Alvesco 157mcg to 4 puffs twice daily for TWO WEEKS.  - Asthma control goals:  * Full participation in all desired activities (may need albuterol before activity) * Albuterol use two time or less a week on average (not counting use with activity) * Cough interfering with sleep two time or less a month * Oral steroids no more than once a year * No hospitalizations  Allergies - Continue avoidance measures for grass pollens, weed pollens, tree pollens, molds, dust mite, cat, mouse, mixed feathers - Continue using Allegra 180mg  or Xyzal 5mg  daily at this time - For watery eyes can use of Pataday 1 drop each eye daily as needed - For nasal congestion use of nasal steroid spray Nasacort 2 sprays each nostril daily for a week or 2 at a time before stopping once symptoms improve -Recommend performing nasal saline rinse and provided with a rinse kit today.  Use either distilled water or boiled water and let sit to room temperature prior to use.  Never used tap water.  Keep mouth open during entire process.  Allergic reaction - Continue avoidance of tree nuts - Have access to self-injectable epinephrine AuviQ 0.3mg  at all times - Follow emergency action plan in case of allergic reaction  Follow up in 4-6 months or sooner if needed

## 2021-12-06 NOTE — Progress Notes (Signed)
Follow-up Note  RE: James Matthews MRN: [id number] DOB: Oct 30, 1995 Date of Office Visit: 12/06/2021   History of present illness: James Matthews is a 26 y.o. male presenting today for follow-up of persistent asthma, allergic rhinitis conjunctivitis and allergy.  He was last seen in the office on 08/02/2021 myself.  She presents today with his mother.  He states "things have been ok" since last visit.  He states his breathing has been better in general.  He does feel like Dorothea Ogle has been helpful for him.  His last injection was on 11/30/2021.  He is tolerating injections well.  He does states over the past week or 2 it does seem to be having more trouble breathing.  He is not taking Alvesco at this time as he did not feel that he needs to take this inhaler.  He has access to albuterol for as needed use.  Since his last visit he is not requiring systemic steroids for his breathing.  He also feels like his allergy symptoms have been worse over the past week or 2.  The symptoms include increased nasal congestion and itchy of throat.  He is taking Flonase 2 sprays once day and does not feel like it helping.   He is taking allegra for past 6 months. He does take hydroxyzine most nights for sleep aid.  He continues to avoid tree nuts without any accidental ingestions or need to use his epinephrine device.  He has an upcoming multi city book tour that he is excited about.  Of note he did get into see pulmonologist, Dr Francine Graven, for second opinion for his asthma.  They did a full PFTs which per patient report was normal.  He did not have any further recommendations or changes to his asthma regimen at this time.  Review of systems: Review of Systems  Constitutional: Negative.   HENT:         See HPI  Eyes: Negative.   Respiratory:         See HPI  Cardiovascular: Negative.   Musculoskeletal: Negative.   Skin: Negative.   Allergic/Immunologic: Negative.   Neurological: Negative.      All other systems negative unless noted above in HPI  Past medical/social/surgical/family history have been reviewed and are unchanged unless specifically indicated below.  No changes  Medication List: Current Outpatient Medications  Medication Sig Dispense Refill   ALPRAZolam (XANAX) 1 MG tablet Take 1 mg by mouth 2 (two) times daily as needed for anxiety.      ALVESCO 160 MCG/ACT inhaler Inhale 2 puffs into the lungs 2 (two) times daily. 1 each 5   amitriptyline (ELAVIL) 10 MG tablet Take 10 mg by mouth at bedtime.     busPIRone (BUSPAR) 30 MG tablet TAKE 1 TABLET BY MOUTH TWICE A DAY 180 tablet 1   desvenlafaxine (PRISTIQ) 100 MG 24 hr tablet Take 200 mg by mouth daily.     EPINEPHrine 0.3 mg/0.3 mL IJ SOAJ injection INJECT 0.3 MLS INTO THE MUSCLE ONCE FOR 1 DOSE. AS DIRECTED FOR LIFE-THREATENING ALLERGIC REACTIONS 1 each 1   Erenumab-aooe (AIMOVIG) 140 MG/ML SOAJ INJECT 1 PEN INTO THE SKIN EVERY 30 DAYS 1 mL 4   fexofenadine (ALLEGRA ODT) 30 MG disintegrating tablet See admin instructions.     levalbuterol (XOPENEX HFA) 45 MCG/ACT inhaler INHALE 2 PUFFS INTO THE LUNGS EVERY 4 HOURS AS NEEDED FOR WHEEZE 15 each 1   levalbuterol (XOPENEX) 1.25 MG/3ML nebulizer solution 1 vial via  nebulizer every 4-6 hours as needed. 75 mL 1   losartan (COZAAR) 50 MG tablet Take 1.5 tablets (75 mg total) by mouth daily. 45 tablet 5   melatonin 3 MG TABS tablet 1 tablet at bedtime as needed     Multiple Vitamin (MULTIVITAMIN ADULT PO) 1 tablet     ondansetron (ZOFRAN-ODT) 8 MG disintegrating tablet Take 1 tablet (8 mg total) by mouth every 8 (eight) hours as needed for nausea or vomiting. 20 tablet 3   rizatriptan (MAXALT) 10 MG tablet Take 1 tablet earliest onset of migraine.  May repeat in 2 hours if needed.  Maximum 2 tablets in 24 hours 10 tablet 5   TEZSPIRE 210 MG/1.91ML syringe INJECT THE CONTENTS OF 1 SYRINGE (210 MG) UNDER THE SKIN EVERY 4 WEEKS 1.91 mL 11   Current Facility-Administered  Medications  Medication Dose Route Frequency Provider Last Rate Last Admin   tezepelumab-ekko (TEZSPIRE) 210 MG/1.91ML syringe 210 mg  210 mg Subcutaneous Q28 days Kennith Gain, MD   210 mg at 11/30/21 1103     Known medication allergies: Allergies  Allergen Reactions   Albuterol     High HR   Chocolate     Face swelling/sounds like angioedema "I had to go to urgent care today due to an allergic reaction to a chocolate candy bar. My throat swelled up and was closing, my nasal passages were congested, and my face was hurting. They gave me a steroid shot at the clinic, and then they sent me home presidone to take for a week" 06/2019   Lisinopril Other (See Comments)   Other Other (See Comments)    Pain med? Unsure of medication, caused n/v     Physical examination: Blood pressure 122/74, pulse (!) 118, temperature 98.1 F (36.7 C), resp. rate 18, SpO2 99 %.  General: Alert, interactive, in no acute distress. HEENT: PERRLA, TMs pearly gray, turbinates mildly edematous without discharge, post-pharynx non erythematous. Neck: Supple without lymphadenopathy. Lungs: Clear to auscultation without wheezing, rhonchi or rales. {no increased work of breathing. CV: Normal S1, S2 without murmurs. Abdomen: Nondistended, nontender. Skin: Warm and dry, without lesions or rashes. Extremities:  No clubbing, cyanosis or edema. Neuro:   Grossly intact.  Diagnositics/Labs:  Spirometry: FEV1: 4.53L 90%, FVC: 5.44 L 89%, ratio consistent with nonobstructive pattern  Assessment and plan:   Asthma  - Daily controller medication(s): Alvesco 117mcg two puffs twice daily + Tezspire monthly.  Urged the importance of taking Alvesco on a consistent basis for maintenance - Rescue medications: Xopenex 4 puffs every 4-6 hours as needed - Changes during respiratory infections or worsening symptoms: Increase Alvesco 170mcg to 4 puffs twice daily for TWO WEEKS.  - Asthma control goals:  * Full  participation in all desired activities (may need albuterol before activity) * Albuterol use two time or less a week on average (not counting use with activity) * Cough interfering with sleep two time or less a month * Oral steroids no more than once a year * No hospitalizations  Allergies - Continue avoidance measures for grass pollens, weed pollens, tree pollens, molds, dust mite, cat, mouse, mixed feathers - Continue using Allegra $RemoveBefor'180mg'PWWlAbDVSWiN$  or Xyzal $Remove'5mg'UxadrgC$  daily at this time - For watery eyes can use of Pataday 1 drop each eye daily as needed - For nasal congestion use of nasal steroid spray Nasacort 2 sprays each nostril daily for a week or 2 at a time before stopping once symptoms improve -Recommend performing nasal saline rinse and  provided with a rinse kit today.  Use either distilled water or boiled water and let sit to room temperature prior to use.  Never used tap water.  Keep mouth open during entire process.  Allergic reaction - Continue avoidance of tree nuts - Have access to self-injectable epinephrine AuviQ 0.3mg  at all times - Follow emergency action plan in case of allergic reaction  Follow up in 4-6 months or sooner if needed I appreciate the opportunity to take part in [place] care. Please do not hesitate to contact me with questions.  Sincerely,   Prudy Feeler, MD Allergy/Immunology [facility]

## 2021-12-06 NOTE — Telephone Encounter (Signed)
Patient request refill with Aimovig.

## 2021-12-07 ENCOUNTER — Telehealth: Payer: Self-pay | Admitting: *Deleted

## 2021-12-07 ENCOUNTER — Encounter: Payer: Self-pay | Admitting: Allergy

## 2021-12-07 ENCOUNTER — Other Ambulatory Visit: Payer: Self-pay | Admitting: *Deleted

## 2021-12-07 MED ORDER — TEZSPIRE 210 MG/1.91ML ~~LOC~~ SOAJ: 210.0000 mg | SUBCUTANEOUS | 11 refills | Status: DC

## 2021-12-07 NOTE — Telephone Encounter (Signed)
Due to new [employer] [place] Id had to do another approval for Tezspire and had to do pen instead of syringe due to recent changes. Also will need to change pharmacy over to [facility] due to pharmacy benefit. L/m for patient to call me to advise same

## 2021-12-08 DIAGNOSIS — R300 Dysuria: Secondary | ICD-10-CM | POA: Diagnosis not present

## 2021-12-11 ENCOUNTER — Telehealth: Payer: Self-pay | Admitting: *Deleted

## 2021-12-11 NOTE — Telephone Encounter (Signed)
Spoke to patient and advised approval and submit to [facility] for [employer]

## 2021-12-11 NOTE — Telephone Encounter (Signed)
I have also forward a MyChart message from patient to Dr Nelva Bush regarding this

## 2021-12-11 NOTE — Telephone Encounter (Signed)
Spoke to patient and he advised the Alvesco deal no more and tried to get at [employer] but $100 wants to know alternative that he can get affordable

## 2021-12-12 NOTE — Telephone Encounter (Signed)
It shows that Qvar is preferred not flovent

## 2021-12-17 ENCOUNTER — Encounter: Payer: Self-pay | Admitting: Allergy

## 2021-12-17 ENCOUNTER — Encounter: Payer: Self-pay | Admitting: Neurology

## 2021-12-18 ENCOUNTER — Telehealth: Payer: Self-pay | Admitting: Allergy

## 2021-12-18 MED ORDER — QVAR REDIHALER 80 MCG/ACT IN AERB: 2.0000 | INHALATION_SPRAY | Freq: Two times a day (BID) | RESPIRATORY_TRACT | 2 refills | Status: DC

## 2021-12-18 NOTE — Telephone Encounter (Signed)
James Matthews came into the office and dropped off Application for Disability Parking Placard from [facility] for Dr. Nelva Bush to sign.  James Matthews would like a phone call when the forms are completed.  [phone].  Forms have been placed in the nurses station.

## 2021-12-22 DIAGNOSIS — F908 Attention-deficit hyperactivity disorder, other type: Secondary | ICD-10-CM | POA: Diagnosis not present

## 2021-12-22 DIAGNOSIS — F4312 Post-traumatic stress disorder, chronic: Secondary | ICD-10-CM | POA: Diagnosis not present

## 2021-12-22 DIAGNOSIS — F411 Generalized anxiety disorder: Secondary | ICD-10-CM | POA: Diagnosis not present

## 2021-12-22 DIAGNOSIS — F422 Mixed obsessional thoughts and acts: Secondary | ICD-10-CM | POA: Diagnosis not present

## 2021-12-25 ENCOUNTER — Other Ambulatory Visit: Payer: Self-pay | Admitting: Allergy

## 2021-12-25 DIAGNOSIS — J4550 Severe persistent asthma, uncomplicated: Secondary | ICD-10-CM

## 2021-12-25 MED ORDER — LEVALBUTEROL TARTRATE 45 MCG/ACT IN AERO
INHALATION_SPRAY | RESPIRATORY_TRACT | 1 refills | Status: DC
Start: 2021-12-25 — End: 2021-12-26

## 2021-12-25 NOTE — Telephone Encounter (Signed)
Prior authorization have been completed for patient.

## 2021-12-28 DIAGNOSIS — F4312 Post-traumatic stress disorder, chronic: Secondary | ICD-10-CM | POA: Diagnosis not present

## 2021-12-28 DIAGNOSIS — F411 Generalized anxiety disorder: Secondary | ICD-10-CM | POA: Diagnosis not present

## 2021-12-28 DIAGNOSIS — F908 Attention-deficit hyperactivity disorder, other type: Secondary | ICD-10-CM | POA: Diagnosis not present

## 2021-12-28 DIAGNOSIS — F422 Mixed obsessional thoughts and acts: Secondary | ICD-10-CM | POA: Diagnosis not present

## 2021-12-28 MED ORDER — LEVALBUTEROL TARTRATE 45 MCG/ACT IN AERO: INHALATION_SPRAY | RESPIRATORY_TRACT | 1 refills | Status: DC

## 2021-12-28 NOTE — Addendum Note (Signed)
Addended by: Areta Haber B on: 12/28/2021 04:14 PM   Modules accepted: Orders

## 2021-12-28 NOTE — Telephone Encounter (Signed)
[place] called in - DOB verified - advised patient's Levalbuterol Tartrate (Xopenex HFA) 45 MCG/ACT inhaler has been approved:  PA - [id number] - Approved today Effective from 12/25/2021 through 12/24/2022.    Electronically sending in medication to pharmacy on fill.

## 2021-12-28 NOTE — Addendum Note (Signed)
Addended by: Areta Haber B on: 12/28/2021 04:16 PM   Modules accepted: Orders

## 2021-12-29 ENCOUNTER — Telehealth: Payer: Self-pay | Admitting: Allergy

## 2021-12-29 NOTE — Telephone Encounter (Signed)
Mychart message and phone message was left for patient to pick up his paperwork on Monday.

## 2021-12-29 NOTE — Telephone Encounter (Signed)
Patient called and stated that his handicap sign expired today and wanted to know when he can pick up the form for his new one. Call back number is [phone]

## 2021-12-29 NOTE — Telephone Encounter (Signed)
LVM for patient letting him know that the paperwork is ready and that he can stop by the office on Monday to pick it up.

## 2022-01-01 ENCOUNTER — Ambulatory Visit (INDEPENDENT_AMBULATORY_CARE_PROVIDER_SITE_OTHER): Payer: BC Managed Care – PPO

## 2022-01-01 ENCOUNTER — Other Ambulatory Visit: Payer: Self-pay | Admitting: Neurology

## 2022-01-01 DIAGNOSIS — J4550 Severe persistent asthma, uncomplicated: Secondary | ICD-10-CM

## 2022-01-02 DIAGNOSIS — F908 Attention-deficit hyperactivity disorder, other type: Secondary | ICD-10-CM | POA: Diagnosis not present

## 2022-01-02 DIAGNOSIS — F4312 Post-traumatic stress disorder, chronic: Secondary | ICD-10-CM | POA: Diagnosis not present

## 2022-01-02 DIAGNOSIS — F411 Generalized anxiety disorder: Secondary | ICD-10-CM | POA: Diagnosis not present

## 2022-01-02 DIAGNOSIS — F422 Mixed obsessional thoughts and acts: Secondary | ICD-10-CM | POA: Diagnosis not present

## 2022-01-08 DIAGNOSIS — F908 Attention-deficit hyperactivity disorder, other type: Secondary | ICD-10-CM | POA: Diagnosis not present

## 2022-01-08 DIAGNOSIS — F411 Generalized anxiety disorder: Secondary | ICD-10-CM | POA: Diagnosis not present

## 2022-01-08 DIAGNOSIS — F4312 Post-traumatic stress disorder, chronic: Secondary | ICD-10-CM | POA: Diagnosis not present

## 2022-01-08 DIAGNOSIS — F422 Mixed obsessional thoughts and acts: Secondary | ICD-10-CM | POA: Diagnosis not present

## 2022-01-10 DIAGNOSIS — Z79899 Other long term (current) drug therapy: Secondary | ICD-10-CM | POA: Diagnosis not present

## 2022-01-10 DIAGNOSIS — F419 Anxiety disorder, unspecified: Secondary | ICD-10-CM | POA: Diagnosis not present

## 2022-01-10 DIAGNOSIS — F902 Attention-deficit hyperactivity disorder, combined type: Secondary | ICD-10-CM | POA: Diagnosis not present

## 2022-01-16 DIAGNOSIS — F4312 Post-traumatic stress disorder, chronic: Secondary | ICD-10-CM | POA: Diagnosis not present

## 2022-01-16 DIAGNOSIS — F422 Mixed obsessional thoughts and acts: Secondary | ICD-10-CM | POA: Diagnosis not present

## 2022-01-16 DIAGNOSIS — F411 Generalized anxiety disorder: Secondary | ICD-10-CM | POA: Diagnosis not present

## 2022-01-16 DIAGNOSIS — F908 Attention-deficit hyperactivity disorder, other type: Secondary | ICD-10-CM | POA: Diagnosis not present

## 2022-01-23 DIAGNOSIS — F4312 Post-traumatic stress disorder, chronic: Secondary | ICD-10-CM | POA: Diagnosis not present

## 2022-01-23 DIAGNOSIS — F411 Generalized anxiety disorder: Secondary | ICD-10-CM | POA: Diagnosis not present

## 2022-01-23 DIAGNOSIS — F422 Mixed obsessional thoughts and acts: Secondary | ICD-10-CM | POA: Diagnosis not present

## 2022-01-23 DIAGNOSIS — F908 Attention-deficit hyperactivity disorder, other type: Secondary | ICD-10-CM | POA: Diagnosis not present

## 2022-01-27 ENCOUNTER — Other Ambulatory Visit: Payer: Self-pay | Admitting: Allergy

## 2022-01-29 ENCOUNTER — Ambulatory Visit: Payer: BC Managed Care – PPO

## 2022-01-29 DIAGNOSIS — T753XXA Motion sickness, initial encounter: Secondary | ICD-10-CM | POA: Diagnosis not present

## 2022-01-29 DIAGNOSIS — R110 Nausea: Secondary | ICD-10-CM | POA: Diagnosis not present

## 2022-01-31 ENCOUNTER — Ambulatory Visit (INDEPENDENT_AMBULATORY_CARE_PROVIDER_SITE_OTHER): Payer: BC Managed Care – PPO

## 2022-01-31 DIAGNOSIS — F4312 Post-traumatic stress disorder, chronic: Secondary | ICD-10-CM | POA: Diagnosis not present

## 2022-01-31 DIAGNOSIS — J4550 Severe persistent asthma, uncomplicated: Secondary | ICD-10-CM | POA: Diagnosis not present

## 2022-01-31 DIAGNOSIS — F411 Generalized anxiety disorder: Secondary | ICD-10-CM | POA: Diagnosis not present

## 2022-01-31 DIAGNOSIS — F908 Attention-deficit hyperactivity disorder, other type: Secondary | ICD-10-CM | POA: Diagnosis not present

## 2022-01-31 DIAGNOSIS — F422 Mixed obsessional thoughts and acts: Secondary | ICD-10-CM | POA: Diagnosis not present

## 2022-02-06 DIAGNOSIS — F4312 Post-traumatic stress disorder, chronic: Secondary | ICD-10-CM | POA: Diagnosis not present

## 2022-02-06 DIAGNOSIS — F422 Mixed obsessional thoughts and acts: Secondary | ICD-10-CM | POA: Diagnosis not present

## 2022-02-06 DIAGNOSIS — F411 Generalized anxiety disorder: Secondary | ICD-10-CM | POA: Diagnosis not present

## 2022-02-06 DIAGNOSIS — F908 Attention-deficit hyperactivity disorder, other type: Secondary | ICD-10-CM | POA: Diagnosis not present

## 2022-02-13 DIAGNOSIS — F422 Mixed obsessional thoughts and acts: Secondary | ICD-10-CM | POA: Diagnosis not present

## 2022-02-13 DIAGNOSIS — F411 Generalized anxiety disorder: Secondary | ICD-10-CM | POA: Diagnosis not present

## 2022-02-13 DIAGNOSIS — F4312 Post-traumatic stress disorder, chronic: Secondary | ICD-10-CM | POA: Diagnosis not present

## 2022-02-13 DIAGNOSIS — F908 Attention-deficit hyperactivity disorder, other type: Secondary | ICD-10-CM | POA: Diagnosis not present

## 2022-02-20 DIAGNOSIS — F4312 Post-traumatic stress disorder, chronic: Secondary | ICD-10-CM | POA: Diagnosis not present

## 2022-02-20 DIAGNOSIS — F908 Attention-deficit hyperactivity disorder, other type: Secondary | ICD-10-CM | POA: Diagnosis not present

## 2022-02-20 DIAGNOSIS — F411 Generalized anxiety disorder: Secondary | ICD-10-CM | POA: Diagnosis not present

## 2022-02-27 DIAGNOSIS — F4312 Post-traumatic stress disorder, chronic: Secondary | ICD-10-CM | POA: Diagnosis not present

## 2022-02-27 DIAGNOSIS — F411 Generalized anxiety disorder: Secondary | ICD-10-CM | POA: Diagnosis not present

## 2022-02-27 DIAGNOSIS — F908 Attention-deficit hyperactivity disorder, other type: Secondary | ICD-10-CM | POA: Diagnosis not present

## 2022-02-28 ENCOUNTER — Ambulatory Visit (INDEPENDENT_AMBULATORY_CARE_PROVIDER_SITE_OTHER): Payer: BC Managed Care – PPO

## 2022-02-28 DIAGNOSIS — J4550 Severe persistent asthma, uncomplicated: Secondary | ICD-10-CM | POA: Diagnosis not present

## 2022-03-06 DIAGNOSIS — F908 Attention-deficit hyperactivity disorder, other type: Secondary | ICD-10-CM | POA: Diagnosis not present

## 2022-03-06 DIAGNOSIS — F4312 Post-traumatic stress disorder, chronic: Secondary | ICD-10-CM | POA: Diagnosis not present

## 2022-03-06 DIAGNOSIS — F411 Generalized anxiety disorder: Secondary | ICD-10-CM | POA: Diagnosis not present

## 2022-03-08 NOTE — Progress Notes (Deleted)
NEUROLOGY FOLLOW UP OFFICE NOTE  James Matthews [id number]  Assessment/Plan:   Migraine without aura, without status migrainosus, not intractable Orthostatic hypotension - medication-induced, now resolved.  Migraine prevention:  Aimovig 140mg  every 28 days Migraine rescue:  Tylenol (may use rizatriptan 10mg  if needed), Zofran Limit use of pain relievers to no more than 2 days out of week to prevent risk of rebound or medication-overuse headache. Keep headache diary Follow up 9 months   Subjective:  James Matthews is a 26 year old male with HTN, CKD and asthma who follows up for migraines.James Matthews  He is accompanied by his mother who supplements history.   UPDATE: Migraines are stable. Intensity:  Moderate to severe Duration:  1 hour with acetaminophen Frequency:  2 in past 30 days      Current NSAIDS:  none Current analgesics:  Tylenol  Current triptans:  rizatriptan 10mg  (has not needed - treating with Tylenol instead).   Current ergotamine:  none Current anti-emetic:  Zofran ODT 8mg  Current muscle relaxants:  none Current anti-anxiolytic:  Clonazepam; busipirone Current sleep aide:  none Current Antihypertensive medications:  none Current Antidepressant medications:  Pristiq Current Anticonvulsant medications:  none Current anti-CGRP:  Aimovig 140mg  Current Vitamins/Herbal/Supplements:  none Current Antihistamines/Decongestants:  Astelin Other therapy:  none Hormone/birth control:  none   Caffeine:  1 cup coffee daily. Diet:  2 liters water daily.  No soda. Exercise:  Once a week Depression:  Yes but stable; Anxiety:  Yes but stable Other pain:  no Sleep hygiene:  8 hours of sleep a night.  Previously poor   HISTORY:  He has history of migraines since childhood but have gotten worse about 2 months ago.  He has no explanation for increased frequency.  They are severe right worse than left parietal pressure-like headaches. They are associated with nausea,  visual disturbance (red dots) photophobia and phonophobia.  They last from several hours to several days.  They have been occurring daily (previously occurring 3 to 4 times a week).  Triggers include light and sound.  Due to his speech disorder, prolonged talking may be a trigger.  Laying down to rest in dark and quiet room helps.  Prednisone taper was ineffective after it was finished.  MRI of brain with and without contrast on 08/06/2019 was normal.   Remote CT head without contrast from 02/19/2007 to evaluate headache was personally reviewed and demonstrated incidental findings consistent with right ethmoid sinusitis but no acute intracranial abnormality.   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  No known cause but possibly genetic.  He was born 3 months premature.  Patient has history of syncope and orthostatic hypotension.  There is associated confusion and blurred vision.  Also reports profuse sweating.  Evaluated by cardiology.  Echo and 14 day event monitor unremarkable.  his nephrologist took him off losartan and symptoms resolved.   Past NSAIDS/steroids:  Prednisone. Unable to take NSAIDs due to renal disorder. Past analgesics:  none Past abortive triptans:  sumatriptan (ineffective) Past abortive ergotamine:  none Past muscle relaxants:  none Past anti-emetic:  Zofran ODT 4mg  Past antihypertensive medications:  lisinopril Past antidepressant/antipsychotic/mood medications:  Fluoxetine, mirtazepine, Seroquel, trazodone Past anticonvulsant medications:  Gabapentin 600mg  Past anti-CGRP:  nonw Past vitamins/Herbal/Supplements:  none Past antihistamines/decongestants:  none Other past therapies:  none     Family history of headache:  Mom (migraines); brother (migraines); sister (migraines)  PAST MEDICAL HISTORY: Past Medical History:  Diagnosis Date   Asthma  Hypertension    IBS (irritable bowel syndrome)    probiotic   Orthostatic hypertension    Posterior urethral  valves    Premature birth    3 months early   Renal disorder    Verbal apraxia     MEDICATIONS: Current Outpatient Medications on File Prior to Visit  Medication Sig Dispense Refill   AIMOVIG 140 MG/ML SOAJ INJECT 1 PEN INTO THE SKIN EVERY 30 DAYS 1 mL 2   ALPRAZolam (XANAX) 1 MG tablet Take 1 mg by mouth 2 (two) times daily as needed for anxiety.      amitriptyline (ELAVIL) 10 MG tablet Take 10 mg by mouth at bedtime.     beclomethasone (QVAR REDIHALER) 80 MCG/ACT inhaler Inhale 2 puffs into the lungs 2 (two) times daily. 1 each 2   busPIRone (BUSPAR) 30 MG tablet TAKE 1 TABLET BY MOUTH TWICE A DAY 180 tablet 1   desvenlafaxine (PRISTIQ) 100 MG 24 hr tablet Take 200 mg by mouth daily.     EPINEPHrine 0.3 mg/0.3 mL IJ SOAJ injection INJECT 0.3 MLS INTO THE MUSCLE ONCE FOR 1 DOSE. AS DIRECTED FOR LIFE-THREATENING ALLERGIC REACTIONS 1 each 1   fexofenadine (ALLEGRA ODT) 30 MG disintegrating tablet See admin instructions.     hydrOXYzine (ATARAX) 25 MG tablet TAKE 1-2 TABLETS 30-60 MINUTES PRIOR TO BEDTIME FOR INSOMNIA, ANXIETY. 180 tablet 0   levalbuterol (XOPENEX HFA) 45 MCG/ACT inhaler INHALE 2 PUFFS INTO THE LUNGS EVERY 4 HOURS AS NEEDED FOR WHEEZE 15 each 1   levalbuterol (XOPENEX) 1.25 MG/3ML nebulizer solution 1 vial via nebulizer every 4-6 hours as needed. 75 mL 1   losartan (COZAAR) 50 MG tablet Take 1.5 tablets (75 mg total) by mouth daily. 45 tablet 5   melatonin 3 MG TABS tablet 1 tablet at bedtime as needed     Multiple Vitamin (MULTIVITAMIN ADULT PO) 1 tablet     ondansetron (ZOFRAN-ODT) 8 MG disintegrating tablet Take 1 tablet (8 mg total) by mouth every 8 (eight) hours as needed for nausea or vomiting. 20 tablet 3   rizatriptan (MAXALT) 10 MG tablet Take 1 tablet earliest onset of migraine.  May repeat in 2 hours if needed.  Maximum 2 tablets in 24 hours 10 tablet 5   Tezepelumab-ekko (TEZSPIRE) 210 MG/1. SOAJ Inject 210 mg into the skin every 28 (twenty-eight) days. 1.91  mL 11   Current Facility-Administered Medications on File Prior to Visit  Medication Dose Route Frequency Provider Last Rate Last Admin   tezepelumab-ekko (TEZSPIRE) 210 MG/1. syringe 210 mg  210 mg Subcutaneous Q28 days Marcelyn Bruins, MD   210 mg at 02/28/22 1524    ALLERGIES: Allergies  Allergen Reactions   Albuterol     High HR   Chocolate     Face swelling/sounds like angioedema "I had to go to urgent care today due to an allergic reaction to a chocolate candy bar. My throat swelled up and was closing, my nasal passages were congested, and my face was hurting. They gave me a steroid shot at the clinic, and then they sent me home presidone to take for a week" 06/2019   Lisinopril Other (See Comments)   Other Other (See Comments)    Pain med? Unsure of medication, caused n/v    FAMILY HISTORY: Family History  Problem Relation Age of Onset   Breast cancer Mother        survivor   Hypertension Mother    Hyperlipidemia Mother    Other  Father        pituitary tumor- gamma knife radiation   Heart disease Maternal Grandfather    Prostate cancer Maternal Grandfather        66s   Melanoma Sister    Prostate cancer Paternal Grandfather        49s   Ovarian cancer Maternal Aunt        mets to liver       Objective:  *** General: No acute distress.  Patient appears well-groomed.   Head:  Normocephalic/atraumatic Eyes:  Fundi examined but not visualized Neck: supple, no paraspinal tenderness, full range of motion Heart:  Regular rate and rhythm Lungs:  Clear to auscultation bilaterally Back: No paraspinal tenderness Neurological Exam: alert and oriented to person, place, and time.  Speech fluent and not dysarthric, language intact.  CN II-XII intact. Bulk and tone normal, muscle strength 5/5 throughout.  Sensation to light touch intact.  Deep tendon reflexes 2+ throughout, toes downgoing.  Finger to nose testing intact.  Gait normal, Romberg negative.   James Millet, DO  CC: Tana Conch, MD

## 2022-03-13 ENCOUNTER — Ambulatory Visit: Payer: BC Managed Care – PPO | Admitting: Neurology

## 2022-03-13 ENCOUNTER — Encounter: Payer: Self-pay | Admitting: Neurology

## 2022-03-14 DIAGNOSIS — F411 Generalized anxiety disorder: Secondary | ICD-10-CM | POA: Diagnosis not present

## 2022-03-14 DIAGNOSIS — F908 Attention-deficit hyperactivity disorder, other type: Secondary | ICD-10-CM | POA: Diagnosis not present

## 2022-03-14 DIAGNOSIS — F4312 Post-traumatic stress disorder, chronic: Secondary | ICD-10-CM | POA: Diagnosis not present

## 2022-03-20 ENCOUNTER — Other Ambulatory Visit: Payer: Self-pay | Admitting: Allergy

## 2022-03-20 DIAGNOSIS — F4312 Post-traumatic stress disorder, chronic: Secondary | ICD-10-CM | POA: Diagnosis not present

## 2022-03-20 DIAGNOSIS — F411 Generalized anxiety disorder: Secondary | ICD-10-CM | POA: Diagnosis not present

## 2022-03-20 DIAGNOSIS — F908 Attention-deficit hyperactivity disorder, other type: Secondary | ICD-10-CM | POA: Diagnosis not present

## 2022-03-26 NOTE — Progress Notes (Unsigned)
NEUROLOGY FOLLOW UP OFFICE NOTE  James Matthews [id number]  Assessment/Plan:   Migraine without aura, without status migrainosus, not intractable Orthostatic hypotension - medication-induced, now resolved.  Migraine prevention:  Aimovig 140mg  every 28 days Migraine rescue:  Tylenol (may use rizatriptan 10mg  if needed), Zofran Limit use of pain relievers to no more than 2 days out of week to prevent risk of rebound or medication-overuse headache. Keep headache diary Follow up 9 months   Subjective:  C. Wherley is a 26 year old male with HTN, CKD and asthma who follows up for migraines.James Matthews  He is accompanied by his mother who supplements history.   UPDATE: Migraines are stable. Intensity:  Moderate to severe Duration:  1 hour with acetaminophen Frequency:  2 in past 30 days      Current NSAIDS:  none Current analgesics:  Tylenol  Current triptans:  rizatriptan 10mg  (has not needed - treating with Tylenol instead).   Current ergotamine:  none Current anti-emetic:  Zofran ODT 8mg  Current muscle relaxants:  none Current anti-anxiolytic:  Clonazepam; busipirone Current sleep aide:  none Current Antihypertensive medications:  none Current Antidepressant medications:  Pristiq Current Anticonvulsant medications:  none Current anti-CGRP:  Aimovig 140mg  Current Vitamins/Herbal/Supplements:  none Current Antihistamines/Decongestants:  Astelin Other therapy:  none Hormone/birth control:  none   Caffeine:  1 cup coffee daily. Diet:  2 liters water daily.  No soda. Exercise:  Once a week Depression:  Yes but stable; Anxiety:  Yes but stable Other pain:  no Sleep hygiene:  8 hours of sleep a night.  Previously poor   HISTORY:  He has history of migraines since childhood but have gotten worse about 2 months ago.  He has no explanation for increased frequency.  They are severe right worse than left parietal pressure-like headaches. They are associated with nausea,  visual disturbance (red dots) photophobia and phonophobia.  They last from several hours to several days.  They have been occurring daily (previously occurring 3 to 4 times a week).  Triggers include light and sound.  Due to his speech disorder, prolonged talking may be a trigger.  Laying down to rest in dark and quiet room helps.  Prednisone taper was ineffective after it was finished.  MRI of brain with and without contrast on 08/06/2019 was normal.   Remote CT head without contrast from 02/19/2007 to evaluate headache was personally reviewed and demonstrated incidental findings consistent with right ethmoid sinusitis but no acute intracranial abnormality.   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  No known cause but possibly genetic.  He was born 3 months premature.  Patient has history of syncope and orthostatic hypotension.  There is associated confusion and blurred vision.  Also reports profuse sweating.  Evaluated by cardiology.  Echo and 14 day event monitor unremarkable.  his nephrologist took him off losartan and symptoms resolved.   Past NSAIDS/steroids:  Prednisone. Unable to take NSAIDs due to renal disorder. Past analgesics:  none Past abortive triptans:  sumatriptan (ineffective) Past abortive ergotamine:  none Past muscle relaxants:  none Past anti-emetic:  Zofran ODT 4mg  Past antihypertensive medications:  lisinopril Past antidepressant/antipsychotic/mood medications:  Fluoxetine, mirtazepine, Seroquel, trazodone Past anticonvulsant medications:  Gabapentin 600mg  Past anti-CGRP:  nonw Past vitamins/Herbal/Supplements:  none Past antihistamines/decongestants:  none Other past therapies:  none     Family history of headache:  Mom (migraines); brother (migraines); sister (migraines)  PAST MEDICAL HISTORY: Past Medical History:  Diagnosis Date   Asthma  Hypertension    IBS (irritable bowel syndrome)    probiotic   Orthostatic hypertension    Posterior urethral  valves    Premature birth    3 months early   Renal disorder    Verbal apraxia     MEDICATIONS: Current Outpatient Medications on File Prior to Visit  Medication Sig Dispense Refill   AIMOVIG 140 MG/ML SOAJ INJECT 1 PEN INTO THE SKIN EVERY 30 DAYS 1 mL 2   ALPRAZolam (XANAX) 1 MG tablet Take 1 mg by mouth 2 (two) times daily as needed for anxiety.      amitriptyline (ELAVIL) 10 MG tablet Take 10 mg by mouth at bedtime.     busPIRone (BUSPAR) 30 MG tablet TAKE 1 TABLET BY MOUTH TWICE A DAY 180 tablet 1   desvenlafaxine (PRISTIQ) 100 MG 24 hr tablet Take 200 mg by mouth daily.     EPINEPHrine 0.3 mg/0.3 mL IJ SOAJ injection INJECT 0.3 MLS INTO THE MUSCLE ONCE FOR 1 DOSE. AS DIRECTED FOR LIFE-THREATENING ALLERGIC REACTIONS 1 each 1   fexofenadine (ALLEGRA ODT) 30 MG disintegrating tablet See admin instructions.     hydrOXYzine (ATARAX) 25 MG tablet TAKE 1-2 TABLETS 30-60 MINUTES PRIOR TO BEDTIME FOR INSOMNIA, ANXIETY. 180 tablet 0   levalbuterol (XOPENEX HFA) 45 MCG/ACT inhaler INHALE 2 PUFFS INTO THE LUNGS EVERY 4 HOURS AS NEEDED FOR WHEEZE 15 each 1   levalbuterol (XOPENEX) 1.25 MG/3ML nebulizer solution 1 vial via nebulizer every 4-6 hours as needed. 75 mL 1   losartan (COZAAR) 50 MG tablet Take 1.5 tablets (75 mg total) by mouth daily. 45 tablet 5   melatonin 3 MG TABS tablet 1 tablet at bedtime as needed     Multiple Vitamin (MULTIVITAMIN ADULT PO) 1 tablet     ondansetron (ZOFRAN-ODT) 8 MG disintegrating tablet Take 1 tablet (8 mg total) by mouth every 8 (eight) hours as needed for nausea or vomiting. 20 tablet 3   QVAR REDIHALER 80 MCG/ACT inhaler INHALE 2 PUFFS INTO THE LUNGS TWICE A DAY 10.6 g 2   rizatriptan (MAXALT) 10 MG tablet Take 1 tablet earliest onset of migraine.  May repeat in 2 hours if needed.  Maximum 2 tablets in 24 hours 10 tablet 5   Tezepelumab-ekko (TEZSPIRE) 210 MG/1. SOAJ Inject 210 mg into the skin every 28 (twenty-eight) days. 1.91 mL 11   Current  Facility-Administered Medications on File Prior to Visit  Medication Dose Route Frequency Provider Last Rate Last Admin   tezepelumab-ekko (TEZSPIRE) 210 MG/1. syringe 210 mg  210 mg Subcutaneous Q28 days Marcelyn Bruins, MD   210 mg at 02/28/22 1524    ALLERGIES: Allergies  Allergen Reactions   Albuterol     High HR   Chocolate     Face swelling/sounds like angioedema "I had to go to urgent care today due to an allergic reaction to a chocolate candy bar. My throat swelled up and was closing, my nasal passages were congested, and my face was hurting. They gave me a steroid shot at the clinic, and then they sent me home presidone to take for a week" 06/2019   Lisinopril Other (See Comments)   Other Other (See Comments)    Pain med? Unsure of medication, caused n/v    FAMILY HISTORY: Family History  Problem Relation Age of Onset   Breast cancer Mother        survivor   Hypertension Mother    Hyperlipidemia Mother    Other Father  pituitary tumor- gamma knife radiation   Heart disease Maternal Grandfather    Prostate cancer Maternal Grandfather        11s   Melanoma Sister    Prostate cancer Paternal Grandfather        49s   Ovarian cancer Maternal Aunt        mets to liver       Objective:  *** General: No acute distress.  Patient appears well-groomed.   Head:  Normocephalic/atraumatic Eyes:  Fundi examined but not visualized Neck: supple, no paraspinal tenderness, full range of motion Heart:  Regular rate and rhythm Lungs:  Clear to auscultation bilaterally Back: No paraspinal tenderness Neurological Exam: alert and oriented to person, place, and time.  Speech fluent and not dysarthric, language intact.  CN II-XII intact. Bulk and tone normal, muscle strength 5/5 throughout.  Sensation to light touch intact.  Deep tendon reflexes 2+ throughout, toes downgoing.  Finger to nose testing intact.  Gait normal, Romberg negative.   Metta Clines, DO  CC:  Garret Reddish, MD

## 2022-03-27 ENCOUNTER — Encounter: Payer: Self-pay | Admitting: Neurology

## 2022-03-27 ENCOUNTER — Ambulatory Visit: Payer: BC Managed Care – PPO | Admitting: Neurology

## 2022-03-27 VITALS — BP 144/90 | HR 74 | Resp 18 | Ht 73.0 in | Wt 158.0 lb

## 2022-03-27 DIAGNOSIS — G43009 Migraine without aura, not intractable, without status migrainosus: Secondary | ICD-10-CM

## 2022-03-27 DIAGNOSIS — F411 Generalized anxiety disorder: Secondary | ICD-10-CM | POA: Diagnosis not present

## 2022-03-27 DIAGNOSIS — F908 Attention-deficit hyperactivity disorder, other type: Secondary | ICD-10-CM | POA: Diagnosis not present

## 2022-03-27 DIAGNOSIS — F4312 Post-traumatic stress disorder, chronic: Secondary | ICD-10-CM | POA: Diagnosis not present

## 2022-03-27 MED ORDER — AIMOVIG 140 MG/ML ~~LOC~~ SOAJ: 140.0000 mg | SUBCUTANEOUS | 11 refills | Status: AC

## 2022-03-28 ENCOUNTER — Ambulatory Visit (INDEPENDENT_AMBULATORY_CARE_PROVIDER_SITE_OTHER): Payer: BC Managed Care – PPO | Admitting: *Deleted

## 2022-03-28 DIAGNOSIS — J4550 Severe persistent asthma, uncomplicated: Secondary | ICD-10-CM | POA: Diagnosis not present

## 2022-04-03 DIAGNOSIS — F4312 Post-traumatic stress disorder, chronic: Secondary | ICD-10-CM | POA: Diagnosis not present

## 2022-04-03 DIAGNOSIS — F411 Generalized anxiety disorder: Secondary | ICD-10-CM | POA: Diagnosis not present

## 2022-04-03 DIAGNOSIS — F908 Attention-deficit hyperactivity disorder, other type: Secondary | ICD-10-CM | POA: Diagnosis not present

## 2022-04-10 DIAGNOSIS — F411 Generalized anxiety disorder: Secondary | ICD-10-CM | POA: Diagnosis not present

## 2022-04-10 DIAGNOSIS — F908 Attention-deficit hyperactivity disorder, other type: Secondary | ICD-10-CM | POA: Diagnosis not present

## 2022-04-10 DIAGNOSIS — F4312 Post-traumatic stress disorder, chronic: Secondary | ICD-10-CM | POA: Diagnosis not present

## 2022-04-11 ENCOUNTER — Ambulatory Visit: Payer: Self-pay | Admitting: Allergy

## 2022-04-20 DIAGNOSIS — F4312 Post-traumatic stress disorder, chronic: Secondary | ICD-10-CM | POA: Diagnosis not present

## 2022-04-20 DIAGNOSIS — F908 Attention-deficit hyperactivity disorder, other type: Secondary | ICD-10-CM | POA: Diagnosis not present

## 2022-04-20 DIAGNOSIS — F411 Generalized anxiety disorder: Secondary | ICD-10-CM | POA: Diagnosis not present

## 2022-04-24 DIAGNOSIS — F908 Attention-deficit hyperactivity disorder, other type: Secondary | ICD-10-CM | POA: Diagnosis not present

## 2022-04-24 DIAGNOSIS — F4312 Post-traumatic stress disorder, chronic: Secondary | ICD-10-CM | POA: Diagnosis not present

## 2022-04-24 DIAGNOSIS — F411 Generalized anxiety disorder: Secondary | ICD-10-CM | POA: Diagnosis not present

## 2022-04-25 ENCOUNTER — Ambulatory Visit: Payer: BC Managed Care – PPO

## 2022-04-26 DIAGNOSIS — F908 Attention-deficit hyperactivity disorder, other type: Secondary | ICD-10-CM | POA: Diagnosis not present

## 2022-04-26 DIAGNOSIS — F4312 Post-traumatic stress disorder, chronic: Secondary | ICD-10-CM | POA: Diagnosis not present

## 2022-04-26 DIAGNOSIS — F411 Generalized anxiety disorder: Secondary | ICD-10-CM | POA: Diagnosis not present

## 2022-05-01 DIAGNOSIS — F908 Attention-deficit hyperactivity disorder, other type: Secondary | ICD-10-CM | POA: Diagnosis not present

## 2022-05-01 DIAGNOSIS — F4312 Post-traumatic stress disorder, chronic: Secondary | ICD-10-CM | POA: Diagnosis not present

## 2022-05-01 DIAGNOSIS — F411 Generalized anxiety disorder: Secondary | ICD-10-CM | POA: Diagnosis not present

## 2022-05-02 ENCOUNTER — Ambulatory Visit: Payer: Self-pay | Admitting: Allergy

## 2022-05-03 DIAGNOSIS — Z79899 Other long term (current) drug therapy: Secondary | ICD-10-CM | POA: Diagnosis not present

## 2022-05-03 DIAGNOSIS — F902 Attention-deficit hyperactivity disorder, combined type: Secondary | ICD-10-CM | POA: Diagnosis not present

## 2022-05-08 ENCOUNTER — Ambulatory Visit (INDEPENDENT_AMBULATORY_CARE_PROVIDER_SITE_OTHER): Payer: BC Managed Care – PPO | Admitting: *Deleted

## 2022-05-08 DIAGNOSIS — J4550 Severe persistent asthma, uncomplicated: Secondary | ICD-10-CM

## 2022-05-08 DIAGNOSIS — F4312 Post-traumatic stress disorder, chronic: Secondary | ICD-10-CM | POA: Diagnosis not present

## 2022-05-08 DIAGNOSIS — F908 Attention-deficit hyperactivity disorder, other type: Secondary | ICD-10-CM | POA: Diagnosis not present

## 2022-05-08 DIAGNOSIS — F411 Generalized anxiety disorder: Secondary | ICD-10-CM | POA: Diagnosis not present

## 2022-05-15 DIAGNOSIS — F4312 Post-traumatic stress disorder, chronic: Secondary | ICD-10-CM | POA: Diagnosis not present

## 2022-05-15 DIAGNOSIS — F411 Generalized anxiety disorder: Secondary | ICD-10-CM | POA: Diagnosis not present

## 2022-05-15 DIAGNOSIS — F908 Attention-deficit hyperactivity disorder, other type: Secondary | ICD-10-CM | POA: Diagnosis not present

## 2022-05-22 DIAGNOSIS — F4312 Post-traumatic stress disorder, chronic: Secondary | ICD-10-CM | POA: Diagnosis not present

## 2022-05-22 DIAGNOSIS — F908 Attention-deficit hyperactivity disorder, other type: Secondary | ICD-10-CM | POA: Diagnosis not present

## 2022-05-22 DIAGNOSIS — F411 Generalized anxiety disorder: Secondary | ICD-10-CM | POA: Diagnosis not present

## 2022-05-29 DIAGNOSIS — F411 Generalized anxiety disorder: Secondary | ICD-10-CM | POA: Diagnosis not present

## 2022-05-29 DIAGNOSIS — F908 Attention-deficit hyperactivity disorder, other type: Secondary | ICD-10-CM | POA: Diagnosis not present

## 2022-05-29 DIAGNOSIS — F4312 Post-traumatic stress disorder, chronic: Secondary | ICD-10-CM | POA: Diagnosis not present

## 2022-05-30 ENCOUNTER — Other Ambulatory Visit: Payer: Self-pay | Admitting: Allergy

## 2022-05-30 DIAGNOSIS — J4550 Severe persistent asthma, uncomplicated: Secondary | ICD-10-CM

## 2022-06-05 ENCOUNTER — Ambulatory Visit (INDEPENDENT_AMBULATORY_CARE_PROVIDER_SITE_OTHER): Payer: BC Managed Care – PPO | Admitting: *Deleted

## 2022-06-05 DIAGNOSIS — J4550 Severe persistent asthma, uncomplicated: Secondary | ICD-10-CM | POA: Diagnosis not present

## 2022-06-05 DIAGNOSIS — F411 Generalized anxiety disorder: Secondary | ICD-10-CM | POA: Diagnosis not present

## 2022-06-05 DIAGNOSIS — F4312 Post-traumatic stress disorder, chronic: Secondary | ICD-10-CM | POA: Diagnosis not present

## 2022-06-05 DIAGNOSIS — F908 Attention-deficit hyperactivity disorder, other type: Secondary | ICD-10-CM | POA: Diagnosis not present

## 2022-06-12 DIAGNOSIS — F4312 Post-traumatic stress disorder, chronic: Secondary | ICD-10-CM | POA: Diagnosis not present

## 2022-06-12 DIAGNOSIS — F908 Attention-deficit hyperactivity disorder, other type: Secondary | ICD-10-CM | POA: Diagnosis not present

## 2022-06-12 DIAGNOSIS — F411 Generalized anxiety disorder: Secondary | ICD-10-CM | POA: Diagnosis not present

## 2022-06-15 ENCOUNTER — Other Ambulatory Visit (HOSPITAL_BASED_OUTPATIENT_CLINIC_OR_DEPARTMENT_OTHER): Payer: Self-pay

## 2022-06-15 ENCOUNTER — Other Ambulatory Visit: Payer: Self-pay | Admitting: Allergy

## 2022-06-15 ENCOUNTER — Ambulatory Visit: Payer: Self-pay | Admitting: Allergy

## 2022-06-15 DIAGNOSIS — J309 Allergic rhinitis, unspecified: Secondary | ICD-10-CM

## 2022-06-15 MED ORDER — LISDEXAMFETAMINE DIMESYLATE 30 MG PO CAPS
30.0000 mg | ORAL_CAPSULE | Freq: Every day | ORAL | 0 refills | Status: DC
  Filled 2022-06-15: qty 30, 30d supply, fill #0

## 2022-06-18 ENCOUNTER — Other Ambulatory Visit (HOSPITAL_BASED_OUTPATIENT_CLINIC_OR_DEPARTMENT_OTHER): Payer: Self-pay

## 2022-06-18 DIAGNOSIS — F330 Major depressive disorder, recurrent, mild: Secondary | ICD-10-CM | POA: Diagnosis not present

## 2022-06-19 DIAGNOSIS — F4312 Post-traumatic stress disorder, chronic: Secondary | ICD-10-CM | POA: Diagnosis not present

## 2022-06-19 DIAGNOSIS — F411 Generalized anxiety disorder: Secondary | ICD-10-CM | POA: Diagnosis not present

## 2022-06-19 DIAGNOSIS — F908 Attention-deficit hyperactivity disorder, other type: Secondary | ICD-10-CM | POA: Diagnosis not present

## 2022-06-27 ENCOUNTER — Ambulatory Visit (INDEPENDENT_AMBULATORY_CARE_PROVIDER_SITE_OTHER): Payer: BC Managed Care – PPO | Admitting: Family Medicine

## 2022-06-27 ENCOUNTER — Encounter: Payer: Self-pay | Admitting: Family Medicine

## 2022-06-27 ENCOUNTER — Other Ambulatory Visit: Payer: Self-pay

## 2022-06-27 VITALS — BP 134/68 | HR 97 | Temp 99.0°F | Resp 14 | Ht 73.0 in | Wt 155.5 lb

## 2022-06-27 DIAGNOSIS — T7800XA Anaphylactic reaction due to unspecified food, initial encounter: Secondary | ICD-10-CM

## 2022-06-27 DIAGNOSIS — J3089 Other allergic rhinitis: Secondary | ICD-10-CM

## 2022-06-27 DIAGNOSIS — J302 Other seasonal allergic rhinitis: Secondary | ICD-10-CM

## 2022-06-27 DIAGNOSIS — J4550 Severe persistent asthma, uncomplicated: Secondary | ICD-10-CM

## 2022-06-27 DIAGNOSIS — T7800XD Anaphylactic reaction due to unspecified food, subsequent encounter: Secondary | ICD-10-CM | POA: Diagnosis not present

## 2022-06-27 DIAGNOSIS — H1013 Acute atopic conjunctivitis, bilateral: Secondary | ICD-10-CM

## 2022-06-27 MED ORDER — QVAR REDIHALER 80 MCG/ACT IN AERB: INHALATION_SPRAY | RESPIRATORY_TRACT | 5 refills | Status: DC

## 2022-06-27 MED ORDER — EPINEPHRINE 0.3 MG/0.3ML IJ SOAJ: INTRAMUSCULAR | 1 refills | Status: DC

## 2022-06-27 MED ORDER — LEVALBUTEROL TARTRATE 45 MCG/ACT IN AERO: 2.0000 | INHALATION_SPRAY | RESPIRATORY_TRACT | 3 refills | Status: DC | PRN

## 2022-06-27 MED ORDER — XHANCE 93 MCG/ACT NA EXHU: 2.0000 | INHALANT_SUSPENSION | Freq: Two times a day (BID) | NASAL | 3 refills | Status: DC | PRN

## 2022-06-27 NOTE — Patient Instructions (Addendum)
Asthma Continue Qvar 80-2 puffs twice a day to prevent cough or wheeze Continue levalbuterol 2 puffs once every 4 hours as needed for cough or wheeze You may use levalbuterol 2 puffs 5 to 15 minutes before activity to decrease cough or wheeze Continue Tezspire injections once every 28 days for control of asthma  Allergic rhinitis This continue allergen avoidance measures directed toward grass pollen, weed pollen, tree pollen, mold, dust mite, mouse, and feather as listed below Begin Allegra 180 mg once a day as needed for runny nose or itch. Remember to rotate to a different antihistamine about every 3 months. Some examples of over the counter antihistamines include Zyrtec (cetirizine), Xyzal (levocetirizine), Allegra (fexofenadine), and Claritin (loratidine).  Begin Xhance 2 sprays in each nostril twice a day as needed for stuffy nose Consider saline nasal rinses as needed for nasal symptoms. Use this before any medicated nasal sprays for best result Consider allergy injections if your symptoms are not well-managed with the treatment plan as listed above  Allergic conjunctivitis Some over the counter eye drops include Pataday one drop in each eye once a day as needed for red, itchy eyes OR Zaditor one drop in each eye twice a day as needed for red itchy eyes.  Food allergy Continue to avoid tree nuts.  In case of an allergic reaction, take Benadryl 50 mg  every 4 hours, and if life-threatening symptoms occur, inject with EpiPen 0.3 mg.  Call the clinic if this treatment plan is not working well for you.  Follow up in 6 months or sooner if needed.  Reducing Pollen Exposure The American Academy of Allergy, Asthma and Immunology suggests the following steps to reduce your exposure to pollen during allergy seasons. Do not hang sheets or clothing out to dry; pollen may collect on these items. Do not mow lawns or spend time around freshly cut grass; mowing stirs up pollen. Keep windows closed  at night.  Keep car windows closed while driving. Minimize morning activities outdoors, a time when pollen counts are usually at their highest. Stay indoors as much as possible when pollen counts or humidity is high and on windy days when pollen tends to remain in the air longer. Use air conditioning when possible.  Many air conditioners have filters that trap the pollen spores. Use a HEPA room air filter to remove pollen form the indoor air you breathe.  Control of Mold Allergen Mold and fungi can grow on a variety of surfaces provided certain temperature and moisture conditions exist.  Outdoor molds grow on plants, decaying vegetation and soil.  The major outdoor mold, Alternaria and Cladosporium, are found in very high numbers during hot and dry conditions.  Generally, a late Summer - Fall peak is seen for common outdoor fungal spores.  Rain will temporarily lower outdoor mold spore count, but counts rise rapidly when the rainy period ends.  The most important indoor molds are Aspergillus and Penicillium.  Dark, humid and poorly ventilated basements are ideal sites for mold growth.  The next most common sites of mold growth are the bathroom and the kitchen.  Outdoor [employer] Use air conditioning and keep windows closed Avoid exposure to decaying vegetation. Avoid leaf raking. Avoid grain handling. Consider wearing a face mask if working in moldy areas.  Indoor Mold Control Maintain humidity below 50%. Clean washable surfaces with 5% bleach solution. Remove sources e.g. Contaminated carpets.   Control of Dust Mite Allergen Dust mites play a major role in allergic asthma  and rhinitis. They occur in environments with high humidity wherever human skin is found. Dust mites absorb humidity from the atmosphere (ie, they do not drink) and feed on organic matter (including shed human and animal skin). Dust mites are a microscopic type of insect that you cannot see with the naked eye. High levels  of dust mites have been detected from mattresses, pillows, carpets, upholstered furniture, bed covers, clothes, soft toys and any woven material. The principal allergen of the dust mite is found in its feces. A gram of dust may contain 1,000 mites and 250,000 fecal particles. Mite antigen is easily measured in the air during house cleaning activities. Dust mites do not bite and do not cause harm to humans, other than by triggering allergies/asthma.  Ways to decrease your exposure to dust mites in your home:  1. Encase mattresses, box springs and pillows with a mite-impermeable barrier or cover  2. Wash sheets, blankets and drapes weekly in hot water (130 F) with detergent and dry them in a dryer on the hot setting.  3. Have the room cleaned frequently with a vacuum cleaner and a damp dust-mop. For carpeting or rugs, vacuuming with a vacuum cleaner equipped with a high-efficiency particulate air (HEPA) filter. The dust mite allergic individual should not be in a room which is being cleaned and should wait 1 hour after cleaning before going into the room.  4. Do not sleep on upholstered furniture (eg, couches).  5. If possible removing carpeting, upholstered furniture and drapery from the home is ideal. Horizontal blinds should be eliminated in the rooms where the person spends the most time (bedroom, study, television room). Washable vinyl, roller-type shades are optimal.  6. Remove all non-washable stuffed toys from the bedroom. Wash stuffed toys weekly like sheets and blankets above.  7. Reduce indoor humidity to less than 50%. Inexpensive humidity monitors can be purchased at most hardware stores. Do not use a humidifier as can make the problem worse and are not recommended.

## 2022-06-27 NOTE — Progress Notes (Signed)
James Matthews [id number] Dept: [phone]  FOLLOW UP NOTE  Patient ID: James Matthews, male    DOB: Jul 22, 1995  Age: 26 y.o. MRN: [id number] Date of Office Visit: 06/27/2022  Assessment  Chief Complaint: Asthma and Follow-up  HPI James Matthews is a 26 year old male with visit.  He was last seen in this clinic on 12/06/2021 by Dr. Nelva Bush for asthma on Tespire injections, allergic rhinitis, and allergic conjunctivitis.  At today's visit, he reports that his asthma has been moderately well-controlled with shortness of breath that occurred about 1-1/2 months ago after he was late receiving his Tezspire injection.  He reports at that time he experienced shortness of breath, wheeze, and dry cough as well as chest tightness for which he continued albuterol with moderate relief of symptoms.  At today's visit, he reports his asthma has been more well-controlled with symptoms including occasional shortness of breath with vigorous activity and occasional cough producing mucus.  He continues Qvar 82 puffs twice a day on most days and reports that he uses levalbuterol about 6 days a week.  He reports lev albuterol does not always immediately relieve his symptoms and he sometimes uses this medication 2 times in a row.  He continues Tezspire injections once a month with no large or local reactions.  He reports a significant decrease in his symptoms of asthma while continuing on Tezspire injections.  Allergic rhinitis is reported as moderately well-controlled with symptoms including nasal congestion which is worse at night, occasional sneeze, occasional clear rhinorrhea, and frequent postnasal drainage.  He continues occasional Flonase with relief of symptoms, however, he reports this was expensive.  He did not report any adverse reaction while using Flonase and Truvada.  He did try Nasacort with no relief of symptoms.  He is currently taking cetirizine with no relief of symptoms.  He  has tried Human resources officer with some relief of symptoms and has also tried Xyzal which he reports depressed his mood.  He occasionally uses nasal saline rinses, however, he reports these rinses leave his sinuses dripping for several hours afterward. His last environmental allergy testing was on 11/27/2019 was positive to grass pollen, weed pollen, tree pollen, mold, dust mite, mouse, and mixed feather. Allergic conjunctivitis is reported as moderately well-controlled with occasional red and itchy eyes for which she is not currently using any medical intervention.  He continues to avoid tree nuts with no accidental ingestion or EpiPen use since his last visit to this clinic.  His current medications are listed in the chart.   Drug Allergies:  Allergies  Allergen Reactions   Albuterol     High HR   Chocolate     Face swelling/sounds like angioedema "I had to go to urgent care today due to an allergic reaction to a chocolate candy bar. My throat swelled up and was closing, my nasal passages were congested, and my face was hurting. They gave me a steroid shot at the clinic, and then they sent me home presidone to take for a week" 06/2019   Lisinopril Other (See Comments)   Other Other (See Comments)    Pain med? Unsure of medication, caused n/v    Physical Exam: BP 134/68   Pulse 97   Temp 99 F (37.2 C) (Temporal)   Resp 14   Ht 6\' 1"  (1.854 m)   Wt 155 lb 8 oz (70.5 kg)   SpO2 100%   BMI 20.52 kg/m    Physical Exam Vitals  reviewed.  Constitutional:      Appearance: Normal appearance.  HENT:     Head: Normocephalic and atraumatic.     Right Ear: Tympanic membrane normal.     Left Ear: Tympanic membrane normal.     Nose:     Comments: Bilateral nares edematous and pale with clear nasal drainage noted.  Pharynx normal.  Ears normal.  Eyes normal.    Mouth/Throat:     Pharynx: Oropharynx is clear.  Eyes:     Conjunctiva/sclera: Conjunctivae normal.  Cardiovascular:     Rate and Rhythm:  Normal rate and regular rhythm.     Heart sounds: Normal heart sounds. No murmur heard. Pulmonary:     Effort: Pulmonary effort is normal.     Breath sounds: Normal breath sounds.     Comments: Lungs clear to auscultation Musculoskeletal:        General: Normal range of motion.     Cervical back: Normal range of motion and neck supple.  Skin:    General: Skin is warm and dry.  Neurological:     Mental Status: He is alert and oriented to person, place, and time.  Psychiatric:        Mood and Affect: Mood normal.        Behavior: Behavior normal.        Thought Content: Thought content normal.        Judgment: Judgment normal.     Diagnostics: FVC 6.43, FEV1 5.45. Predicted FVC 6.02, predicted FEV1 4.92. Spirometry indicates normal ventilatory function.  Assessment and Plan: 1. Severe persistent asthma without complication   2. Seasonal and perennial allergic rhinitis   3. Allergic conjunctivitis of both eyes   4. Anaphylaxis due to food     Meds ordered this encounter  Medications   Fluticasone Propionate (XHANCE) 93 MCG/ACT EXHU    Sig: Place 2 puffs into the nose 2 (two) times daily as needed.    Dispense:  16 mL    Refill:  3   levalbuterol (XOPENEX HFA) 45 MCG/ACT inhaler    Sig: Inhale 2 puffs into the lungs every 4 (four) hours as needed for wheezing.    Dispense:  15 g    Refill:  3   beclomethasone (QVAR REDIHALER) 80 MCG/ACT inhaler    Sig: INHALE 2 PUFFS INTO THE LUNGS TWICE A DAY    Dispense:  10.6 g    Refill:  5   EPINEPHrine 0.3 mg/0.3 mL IJ SOAJ injection    Sig: INJECT 0.3 MLS INTO THE MUSCLE ONCE FOR 1 DOSE. AS DIRECTED FOR LIFE-THREATENING ALLERGIC REACTIONS    Dispense:  1 each    Refill:  1    Patient Instructions  Asthma Continue Qvar 80-2 puffs twice a day to prevent cough or wheeze Continue levalbuterol 2 puffs once every 4 hours as needed for cough or wheeze You may use levalbuterol 2 puffs 5 to 15 minutes before activity to decrease cough  or wheeze Continue Tezspire injections once every 28 days for control of asthma  Allergic rhinitis This continue allergen avoidance measures directed toward grass pollen, weed pollen, tree pollen, mold, dust mite, mouse, and feather as listed below Begin Allegra 180 mg once a day as needed for runny nose or itch. Remember to rotate to a different antihistamine about every 3 months. Some examples of over the counter antihistamines include Zyrtec (cetirizine), Xyzal (levocetirizine), Allegra (fexofenadine), and Claritin (loratidine).  Begin Xhance 2 sprays in each nostril twice a day  as needed for stuffy nose Consider saline nasal rinses as needed for nasal symptoms. Use this before any medicated nasal sprays for best result Consider allergy injections if your symptoms are not well-managed with the treatment plan as listed above  Allergic conjunctivitis Some over the counter eye drops include Pataday one drop in each eye once a day as needed for red, itchy eyes OR Zaditor one drop in each eye twice a day as needed for red itchy eyes.  Food allergy Continue to avoid tree nuts.  In case of an allergic reaction, take Benadryl 50 mg  every 4 hours, and if life-threatening symptoms occur, inject with EpiPen 0.3 mg.  Call the clinic if this treatment plan is not working well for you.  Follow up in 6 months or sooner if needed.   Return in about 6 months (around 12/27/2022), or if symptoms worsen or fail to improve.    Thank you for the opportunity to care for this patient.  Please do not hesitate to contact me with questions.  Thermon Leyland, FNP [facility]

## 2022-06-27 NOTE — Addendum Note (Signed)
Addended by: Rolland Bimler D on: 06/27/2022 04:56 PM   Modules accepted: Orders

## 2022-06-29 DIAGNOSIS — N182 Chronic kidney disease, stage 2 (mild): Secondary | ICD-10-CM | POA: Diagnosis not present

## 2022-06-29 DIAGNOSIS — Z8639 Personal history of other endocrine, nutritional and metabolic disease: Secondary | ICD-10-CM | POA: Diagnosis not present

## 2022-06-29 DIAGNOSIS — J4540 Moderate persistent asthma, uncomplicated: Secondary | ICD-10-CM | POA: Diagnosis not present

## 2022-06-29 DIAGNOSIS — Z23 Encounter for immunization: Secondary | ICD-10-CM | POA: Diagnosis not present

## 2022-06-29 DIAGNOSIS — Z113 Encounter for screening for infections with a predominantly sexual mode of transmission: Secondary | ICD-10-CM | POA: Diagnosis not present

## 2022-07-04 ENCOUNTER — Ambulatory Visit: Payer: BC Managed Care – PPO

## 2022-07-10 ENCOUNTER — Other Ambulatory Visit (HOSPITAL_BASED_OUTPATIENT_CLINIC_OR_DEPARTMENT_OTHER): Payer: Self-pay

## 2022-07-10 DIAGNOSIS — F908 Attention-deficit hyperactivity disorder, other type: Secondary | ICD-10-CM | POA: Diagnosis not present

## 2022-07-10 DIAGNOSIS — F411 Generalized anxiety disorder: Secondary | ICD-10-CM | POA: Diagnosis not present

## 2022-07-10 DIAGNOSIS — F4312 Post-traumatic stress disorder, chronic: Secondary | ICD-10-CM | POA: Diagnosis not present

## 2022-07-10 MED ORDER — AMPHETAMINE-DEXTROAMPHETAMINE 20 MG PO TABS
20.0000 mg | ORAL_TABLET | Freq: Every day | ORAL | 0 refills | Status: DC
  Filled 2022-07-10: qty 30, 30d supply, fill #0

## 2022-07-10 MED ORDER — LISDEXAMFETAMINE DIMESYLATE 30 MG PO CAPS
30.0000 mg | ORAL_CAPSULE | Freq: Every day | ORAL | 0 refills | Status: DC
  Filled 2022-07-10 – 2022-07-16 (×2): qty 30, 30d supply, fill #0

## 2022-07-16 ENCOUNTER — Other Ambulatory Visit (HOSPITAL_BASED_OUTPATIENT_CLINIC_OR_DEPARTMENT_OTHER): Payer: Self-pay

## 2022-07-17 DIAGNOSIS — F411 Generalized anxiety disorder: Secondary | ICD-10-CM | POA: Diagnosis not present

## 2022-07-17 DIAGNOSIS — F908 Attention-deficit hyperactivity disorder, other type: Secondary | ICD-10-CM | POA: Diagnosis not present

## 2022-07-17 DIAGNOSIS — F4312 Post-traumatic stress disorder, chronic: Secondary | ICD-10-CM | POA: Diagnosis not present

## 2022-07-18 ENCOUNTER — Ambulatory Visit (INDEPENDENT_AMBULATORY_CARE_PROVIDER_SITE_OTHER): Payer: BC Managed Care – PPO

## 2022-07-18 DIAGNOSIS — J4550 Severe persistent asthma, uncomplicated: Secondary | ICD-10-CM | POA: Diagnosis not present

## 2022-07-27 DIAGNOSIS — F411 Generalized anxiety disorder: Secondary | ICD-10-CM | POA: Diagnosis not present

## 2022-07-27 DIAGNOSIS — F908 Attention-deficit hyperactivity disorder, other type: Secondary | ICD-10-CM | POA: Diagnosis not present

## 2022-07-27 DIAGNOSIS — F4312 Post-traumatic stress disorder, chronic: Secondary | ICD-10-CM | POA: Diagnosis not present

## 2022-07-30 DIAGNOSIS — G4700 Insomnia, unspecified: Secondary | ICD-10-CM | POA: Diagnosis not present

## 2022-07-30 DIAGNOSIS — F330 Major depressive disorder, recurrent, mild: Secondary | ICD-10-CM | POA: Diagnosis not present

## 2022-07-30 DIAGNOSIS — F411 Generalized anxiety disorder: Secondary | ICD-10-CM | POA: Diagnosis not present

## 2022-07-31 DIAGNOSIS — F908 Attention-deficit hyperactivity disorder, other type: Secondary | ICD-10-CM | POA: Diagnosis not present

## 2022-07-31 DIAGNOSIS — F411 Generalized anxiety disorder: Secondary | ICD-10-CM | POA: Diagnosis not present

## 2022-07-31 DIAGNOSIS — F4312 Post-traumatic stress disorder, chronic: Secondary | ICD-10-CM | POA: Diagnosis not present

## 2022-08-02 ENCOUNTER — Other Ambulatory Visit (HOSPITAL_BASED_OUTPATIENT_CLINIC_OR_DEPARTMENT_OTHER): Payer: Self-pay

## 2022-08-02 DIAGNOSIS — Z79899 Other long term (current) drug therapy: Secondary | ICD-10-CM | POA: Diagnosis not present

## 2022-08-02 DIAGNOSIS — F902 Attention-deficit hyperactivity disorder, combined type: Secondary | ICD-10-CM | POA: Diagnosis not present

## 2022-08-02 MED ORDER — LISDEXAMFETAMINE DIMESYLATE 30 MG PO CAPS
30.0000 mg | ORAL_CAPSULE | Freq: Every day | ORAL | 0 refills | Status: DC
  Filled 2022-08-17: qty 30, 30d supply, fill #0

## 2022-08-07 DIAGNOSIS — F908 Attention-deficit hyperactivity disorder, other type: Secondary | ICD-10-CM | POA: Diagnosis not present

## 2022-08-07 DIAGNOSIS — F411 Generalized anxiety disorder: Secondary | ICD-10-CM | POA: Diagnosis not present

## 2022-08-07 DIAGNOSIS — F4312 Post-traumatic stress disorder, chronic: Secondary | ICD-10-CM | POA: Diagnosis not present

## 2022-08-14 DIAGNOSIS — F908 Attention-deficit hyperactivity disorder, other type: Secondary | ICD-10-CM | POA: Diagnosis not present

## 2022-08-14 DIAGNOSIS — F411 Generalized anxiety disorder: Secondary | ICD-10-CM | POA: Diagnosis not present

## 2022-08-14 DIAGNOSIS — F4312 Post-traumatic stress disorder, chronic: Secondary | ICD-10-CM | POA: Diagnosis not present

## 2022-08-15 ENCOUNTER — Ambulatory Visit: Payer: BC Managed Care – PPO

## 2022-08-16 DIAGNOSIS — R52 Pain, unspecified: Secondary | ICD-10-CM | POA: Diagnosis not present

## 2022-08-16 DIAGNOSIS — J029 Acute pharyngitis, unspecified: Secondary | ICD-10-CM | POA: Diagnosis not present

## 2022-08-16 DIAGNOSIS — B349 Viral infection, unspecified: Secondary | ICD-10-CM | POA: Diagnosis not present

## 2022-08-16 DIAGNOSIS — R300 Dysuria: Secondary | ICD-10-CM | POA: Diagnosis not present

## 2022-08-16 DIAGNOSIS — R3989 Other symptoms and signs involving the genitourinary system: Secondary | ICD-10-CM | POA: Diagnosis not present

## 2022-08-17 ENCOUNTER — Other Ambulatory Visit (HOSPITAL_BASED_OUTPATIENT_CLINIC_OR_DEPARTMENT_OTHER): Payer: Self-pay

## 2022-08-20 ENCOUNTER — Other Ambulatory Visit (HOSPITAL_BASED_OUTPATIENT_CLINIC_OR_DEPARTMENT_OTHER): Payer: Self-pay

## 2022-08-21 DIAGNOSIS — F411 Generalized anxiety disorder: Secondary | ICD-10-CM | POA: Diagnosis not present

## 2022-08-21 DIAGNOSIS — F908 Attention-deficit hyperactivity disorder, other type: Secondary | ICD-10-CM | POA: Diagnosis not present

## 2022-08-21 DIAGNOSIS — F4312 Post-traumatic stress disorder, chronic: Secondary | ICD-10-CM | POA: Diagnosis not present

## 2022-08-28 DIAGNOSIS — F908 Attention-deficit hyperactivity disorder, other type: Secondary | ICD-10-CM | POA: Diagnosis not present

## 2022-08-28 DIAGNOSIS — F4312 Post-traumatic stress disorder, chronic: Secondary | ICD-10-CM | POA: Diagnosis not present

## 2022-08-28 DIAGNOSIS — F411 Generalized anxiety disorder: Secondary | ICD-10-CM | POA: Diagnosis not present

## 2022-09-10 ENCOUNTER — Encounter: Payer: Self-pay | Admitting: Allergy

## 2022-09-12 DIAGNOSIS — F908 Attention-deficit hyperactivity disorder, other type: Secondary | ICD-10-CM | POA: Diagnosis not present

## 2022-09-12 DIAGNOSIS — F411 Generalized anxiety disorder: Secondary | ICD-10-CM | POA: Diagnosis not present

## 2022-09-12 DIAGNOSIS — F4312 Post-traumatic stress disorder, chronic: Secondary | ICD-10-CM | POA: Diagnosis not present

## 2022-09-13 DIAGNOSIS — Z905 Acquired absence of kidney: Secondary | ICD-10-CM | POA: Diagnosis not present

## 2022-09-18 DIAGNOSIS — F908 Attention-deficit hyperactivity disorder, other type: Secondary | ICD-10-CM | POA: Diagnosis not present

## 2022-09-18 DIAGNOSIS — F411 Generalized anxiety disorder: Secondary | ICD-10-CM | POA: Diagnosis not present

## 2022-09-18 DIAGNOSIS — F4312 Post-traumatic stress disorder, chronic: Secondary | ICD-10-CM | POA: Diagnosis not present

## 2022-09-20 ENCOUNTER — Other Ambulatory Visit (HOSPITAL_BASED_OUTPATIENT_CLINIC_OR_DEPARTMENT_OTHER): Payer: Self-pay

## 2022-09-20 MED ORDER — LISDEXAMFETAMINE DIMESYLATE 40 MG PO CAPS
40.0000 mg | ORAL_CAPSULE | Freq: Every day | ORAL | 0 refills | Status: DC
  Filled 2022-09-20: qty 30, 30d supply, fill #0

## 2022-09-24 ENCOUNTER — Ambulatory Visit (INDEPENDENT_AMBULATORY_CARE_PROVIDER_SITE_OTHER): Payer: BC Managed Care – PPO | Admitting: *Deleted

## 2022-09-24 DIAGNOSIS — J4550 Severe persistent asthma, uncomplicated: Secondary | ICD-10-CM | POA: Diagnosis not present

## 2022-09-25 DIAGNOSIS — F330 Major depressive disorder, recurrent, mild: Secondary | ICD-10-CM | POA: Diagnosis not present

## 2022-09-25 DIAGNOSIS — F908 Attention-deficit hyperactivity disorder, other type: Secondary | ICD-10-CM | POA: Diagnosis not present

## 2022-09-25 DIAGNOSIS — F411 Generalized anxiety disorder: Secondary | ICD-10-CM | POA: Diagnosis not present

## 2022-09-25 DIAGNOSIS — F4312 Post-traumatic stress disorder, chronic: Secondary | ICD-10-CM | POA: Diagnosis not present

## 2022-09-25 DIAGNOSIS — G4700 Insomnia, unspecified: Secondary | ICD-10-CM | POA: Diagnosis not present

## 2022-10-02 DIAGNOSIS — F4312 Post-traumatic stress disorder, chronic: Secondary | ICD-10-CM | POA: Diagnosis not present

## 2022-10-02 DIAGNOSIS — F908 Attention-deficit hyperactivity disorder, other type: Secondary | ICD-10-CM | POA: Diagnosis not present

## 2022-10-02 DIAGNOSIS — F411 Generalized anxiety disorder: Secondary | ICD-10-CM | POA: Diagnosis not present

## 2022-10-10 DIAGNOSIS — F4312 Post-traumatic stress disorder, chronic: Secondary | ICD-10-CM | POA: Diagnosis not present

## 2022-10-10 DIAGNOSIS — F411 Generalized anxiety disorder: Secondary | ICD-10-CM | POA: Diagnosis not present

## 2022-10-10 DIAGNOSIS — F908 Attention-deficit hyperactivity disorder, other type: Secondary | ICD-10-CM | POA: Diagnosis not present

## 2022-10-15 ENCOUNTER — Other Ambulatory Visit: Payer: Self-pay

## 2022-10-15 ENCOUNTER — Encounter: Payer: Self-pay | Admitting: Family

## 2022-10-15 ENCOUNTER — Ambulatory Visit (INDEPENDENT_AMBULATORY_CARE_PROVIDER_SITE_OTHER): Payer: BC Managed Care – PPO | Admitting: Family

## 2022-10-15 ENCOUNTER — Telehealth: Payer: Self-pay

## 2022-10-15 VITALS — BP 124/56 | HR 100 | Temp 98.7°F | Resp 16 | Ht 73.0 in | Wt 164.4 lb

## 2022-10-15 DIAGNOSIS — Z03818 Encounter for observation for suspected exposure to other biological agents ruled out: Secondary | ICD-10-CM | POA: Diagnosis not present

## 2022-10-15 DIAGNOSIS — R399 Unspecified symptoms and signs involving the genitourinary system: Secondary | ICD-10-CM | POA: Diagnosis not present

## 2022-10-15 DIAGNOSIS — R5383 Other fatigue: Secondary | ICD-10-CM | POA: Diagnosis not present

## 2022-10-15 DIAGNOSIS — J302 Other seasonal allergic rhinitis: Secondary | ICD-10-CM

## 2022-10-15 DIAGNOSIS — H1013 Acute atopic conjunctivitis, bilateral: Secondary | ICD-10-CM

## 2022-10-15 DIAGNOSIS — J3089 Other allergic rhinitis: Secondary | ICD-10-CM | POA: Diagnosis not present

## 2022-10-15 DIAGNOSIS — T7800XD Anaphylactic reaction due to unspecified food, subsequent encounter: Secondary | ICD-10-CM

## 2022-10-15 DIAGNOSIS — J4540 Moderate persistent asthma, uncomplicated: Secondary | ICD-10-CM | POA: Diagnosis not present

## 2022-10-15 DIAGNOSIS — J4551 Severe persistent asthma with (acute) exacerbation: Secondary | ICD-10-CM

## 2022-10-15 DIAGNOSIS — J309 Allergic rhinitis, unspecified: Secondary | ICD-10-CM | POA: Diagnosis not present

## 2022-10-15 DIAGNOSIS — T7800XA Anaphylactic reaction due to unspecified food, initial encounter: Secondary | ICD-10-CM

## 2022-10-15 MED ORDER — PREDNISONE 10 MG PO TABS: ORAL_TABLET | ORAL | 0 refills | Status: DC

## 2022-10-15 NOTE — Telephone Encounter (Signed)
Noted! Thank you

## 2022-10-15 NOTE — Patient Instructions (Addendum)
Asthma with acute exacerbation Your breathing test looks good today, but due to your symptoms we will start you on some prednisone.  Take 10 mg 1 tablet twice a day for 3 days, then on the fourth day take 1 tablet and stop Continue Qvar 80 mcg-2 puffs twice a day to prevent cough or wheeze Continue levalbuterol 2 puffs once every 4 hours as needed for cough or wheeze You may use levalbuterol 2 puffs 5 to 15 minutes before activity to decrease cough or wheeze Continue Tezspire injections once every 28 days for control of asthma. Previously been on [place]  Asthma control goals:  Full participation in all desired activities (may need albuterol before activity) Albuterol use two time or less a week on average (not counting use with activity) Cough interfering with sleep two time or less a month Oral steroids no more than once a year No hospitalizations   Allergic rhinitis This continue allergen avoidance measures directed toward grass pollen, weed pollen, tree pollen, mold, dust mite, mouse, and feather as listed below Begin Allegra 180 mg once a day as needed for runny nose or itch. Remember to rotate to a different antihistamine about every 3 months. Some examples of over the counter antihistamines include Zyrtec (cetirizine), Xyzal (levocetirizine), Allegra (fexofenadine), and Claritin (loratidine).  Begin Xhance 2 sprays in each nostril twice a day as needed for stuffy nose Consider saline nasal rinses as needed for nasal symptoms. Use this before any medicated nasal sprays for best result Consider allergy injections if your symptoms are not well-managed with the treatment plan as listed above  Allergic conjunctivitis Some over the counter eye drops include Pataday one drop in each eye once a day as needed for red, itchy eyes OR Zaditor one drop in each eye twice a day as needed for red itchy eyes.  Sample of Pataday given.  Food allergy Continue to avoid tree nuts.  In case of  an allergic reaction, take Benadryl 50 mg  every 4 hours, and if life-threatening symptoms occur, inject with EpiPen 0.3 mg.  Call the clinic if this treatment plan is not working well for you.  Follow up in 3 months or sooner if needed.  Reducing Pollen Exposure The American Academy of Allergy, Asthma and Immunology suggests the following steps to reduce your exposure to pollen during allergy seasons. Do not hang sheets or clothing out to dry; pollen may collect on these items. Do not mow lawns or spend time around freshly cut grass; mowing stirs up pollen. Keep windows closed at night.  Keep car windows closed while driving. Minimize morning activities outdoors, a time when pollen counts are usually at their highest. Stay indoors as much as possible when pollen counts or humidity is high and on windy days when pollen tends to remain in the air longer. Use air conditioning when possible.  Many air conditioners have filters that trap the pollen spores. Use a HEPA room air filter to remove pollen form the indoor air you breathe.  Control of Mold Allergen Mold and fungi can grow on a variety of surfaces provided certain temperature and moisture conditions exist.  Outdoor molds grow on plants, decaying vegetation and soil.  The major outdoor mold, Alternaria and Cladosporium, are found in very high numbers during hot and dry conditions.  Generally, a late Summer - Fall peak is seen for common outdoor fungal spores.  Rain will temporarily lower outdoor mold spore count, but counts rise rapidly when the rainy period  ends.  The most important indoor molds are Aspergillus and Penicillium.  Dark, humid and poorly ventilated basements are ideal sites for mold growth.  The next most common sites of mold growth are the bathroom and the kitchen.  Outdoor [employer] Use air conditioning and keep windows closed Avoid exposure to decaying vegetation. Avoid leaf raking. Avoid grain handling. Consider  wearing a face mask if working in moldy areas.  Indoor Mold Control Maintain humidity below 50%. Clean washable surfaces with 5% bleach solution. Remove sources e.g. Contaminated carpets.   Control of Dust Mite Allergen Dust mites play a major role in allergic asthma and rhinitis. They occur in environments with high humidity wherever human skin is found. Dust mites absorb humidity from the atmosphere (ie, they do not drink) and feed on organic matter (including shed human and animal skin). Dust mites are a microscopic type of insect that you cannot see with the naked eye. High levels of dust mites have been detected from mattresses, pillows, carpets, upholstered furniture, bed covers, clothes, soft toys and any woven material. The principal allergen of the dust mite is found in its feces. A gram of dust may contain 1,000 mites and 250,000 fecal particles. Mite antigen is easily measured in the air during house cleaning activities. Dust mites do not bite and do not cause harm to humans, other than by triggering allergies/asthma.  Ways to decrease your exposure to dust mites in your home:  1. Encase mattresses, box springs and pillows with a mite-impermeable barrier or cover  2. Wash sheets, blankets and drapes weekly in hot water (130 F) with detergent and dry them in a dryer on the hot setting.  3. Have the room cleaned frequently with a vacuum cleaner and a damp dust-mop. For carpeting or rugs, vacuuming with a vacuum cleaner equipped with a high-efficiency particulate air (HEPA) filter. The dust mite allergic individual should not be in a room which is being cleaned and should wait 1 hour after cleaning before going into the room.  4. Do not sleep on upholstered furniture (eg, couches).  5. If possible removing carpeting, upholstered furniture and drapery from the home is ideal. Horizontal blinds should be eliminated in the rooms where the person spends the most time (bedroom, study,  television room). Washable vinyl, roller-type shades are optimal.  6. Remove all non-washable stuffed toys from the bedroom. Wash stuffed toys weekly like sheets and blankets above.  7. Reduce indoor humidity to less than 50%. Inexpensive humidity monitors can be purchased at most hardware stores. Do not use a humidifier as can make the problem worse and are not recommended.

## 2022-10-15 NOTE — Telephone Encounter (Signed)
Patient called in - DOB verified - stated he started having the following symptoms on Saturday: some shortness of breath and chest tightness.  Patient stated he is following provider guidelines as well.  Schedule Same Day appt: today - Monday, 10/15/22 @ 1:30 pm w/Chrissie.  Forwarding message to Chrissie as well.

## 2022-10-15 NOTE — Progress Notes (Signed)
[address] [place] [address] Dept: [phone]  FOLLOW UP NOTE  Patient ID: James Matthews, male    DOB: 12/28/1995  Age: 27 y.o. MRN: [id number] Date of Office Visit: 10/15/2022  Assessment  Chief Complaint: Asthma, Follow-up, Shortness of Breath, and Cough  HPI James Sreekar Manner is a 27 year old male who presents today for an acute visit of shortness of breath.  He was last seen on June 27, 2022 by Thermon Leyland, FNP for severe persistent asthma, seasonal and perennial allergic rhinitis, allergic conjunctivitis, and anaphylaxis due to food: He denies any new diagnosis or surgery since his last office visit.  Asthma: He reports that on Friday he had a headache and took the day off from work.  On Saturday he noticed that it was difficult to regulate his breathing.  He reports a dry cough, tightness in his chest, and shortness of breath.  Last night he had nocturnal awakenings due to breathing problems and reports it was not due to being anxious.  He denies wheezing, fever, and chills.  He has been using his levoalbuterol 3 times a day since the symptoms started and it does help.  Since his last office visit he has not required any systemic steroids or made any trips to the emergency room or urgent care due to breathing problems.  He continues to take Qvar 80 mcg 2 puffs twice a day and Tezspire injections every 28 days.  He denies any problems or reactions with his Tezspire injections and reports that they have helped his asthma.  He has previously been on [place]  Seasonal and perennial allergic rhinitis: He is currently taking Zyrtec 10 mg at night and has not used Xhance nasal spray, because he was not sure how to use.  Instructed him that he could look on line for videos on how to use.  He reports postnasal drip and denies rhinorrhea and nasal congestion.  He has not had any sinus infections since we last saw him.  Allergic conjunctivitis: He reports itchy  watery eyes.  The other day he had to pull over due to itchy eyes and not being able to open his eyes.  He does report that he has something for his eyes and thinks it is Pataday.  Anaphylaxis due to allergy: He continues to avoid tree nuts without any accidental ingestion or use of his epinephrine autoinjector device.   Drug Allergies:  Allergies  Allergen Reactions   Albuterol     High HR   Chocolate     Face swelling/sounds like angioedema "I had to go to urgent care today due to an allergic reaction to a chocolate candy bar. My throat swelled up and was closing, my nasal passages were congested, and my face was hurting. They gave me a steroid shot at the clinic, and then they sent me home presidone to take for a week" 06/2019   Lisinopril Other (See Comments)   Other Other (See Comments)    Pain med? Unsure of medication, caused n/v    Review of Systems: Review of Systems  Constitutional:  Negative for chills and fever.       Reports fatigue  HENT:         Reports post nasal drip and denies rhinorrhea and nasal congestion  Respiratory:  Positive for cough and shortness of breath. Negative for wheezing.        Reports dry cough, shortness of breath, and tightness in chest since Saturday.  Also  reports nocturnal awakenings last night due to breathing problems  Gastrointestinal:        Reports heartburn/reflux symptoms a couple times a week  Skin:  Negative for itching and rash.  Neurological:  Positive for headaches.       Reports a headache Friday  Endo/Heme/Allergies:  Positive for environmental allergies.     Physical Exam: BP (!) 124/56   Pulse 100   Temp 98.7 F (37.1 C) (Temporal)   Resp 16   Ht 6\' 1"  (1.854 m)   Wt 164 lb 6.4 oz (74.6 kg)   SpO2 100%   BMI 21.69 kg/m    Physical Exam Constitutional:      Appearance: Normal appearance.  HENT:     Head: Normocephalic and atraumatic.     Comments: Pharynx normal, eyes normal, ears normal nose normal    Right  Ear: Tympanic membrane, ear canal and external ear normal.     Left Ear: Tympanic membrane, ear canal and external ear normal.     Nose: Nose normal.     Mouth/Throat:     Mouth: Mucous membranes are moist.     Pharynx: Oropharynx is clear.  Eyes:     Conjunctiva/sclera: Conjunctivae normal.  Cardiovascular:     Rate and Rhythm: Regular rhythm.     Heart sounds: Normal heart sounds.  Pulmonary:     Effort: Pulmonary effort is normal.     Breath sounds: Normal breath sounds.     Comments: Lungs clear to auscultation Musculoskeletal:     Cervical back: Neck supple.  Skin:    General: Skin is warm.  Neurological:     Mental Status: He is alert and oriented to person, place, and time.  Psychiatric:        Mood and Affect: Mood normal.        Behavior: Behavior normal.        Thought Content: Thought content normal.        Judgment: Judgment normal.     Diagnostics: FVC 6.49 L (106%), FEV1 5.37 L (106%).  Spirometry indicates normal spirometry.  Assessment and Plan: 1. Severe persistent asthma with acute exacerbation   2. Seasonal and perennial allergic rhinitis   3. Allergic conjunctivitis of both eyes   4. Anaphylaxis due to food     Meds ordered this encounter  Medications   predniSONE (DELTASONE) 10 MG tablet    Sig: Take one tablet twice a day for 3 days, then on the 4th day take one tablet and stop    Dispense:  7 tablet    Refill:  0    Patient Instructions  Asthma with acute exacerbation Your breathing test looks good today, but due to your symptoms we will start you on some prednisone.  Take 10 mg 1 tablet twice a day for 3 days, then on the fourth day take 1 tablet and stop Continue Qvar 80 mcg-2 puffs twice a day to prevent cough or wheeze Continue levalbuterol 2 puffs once every 4 hours as needed for cough or wheeze You may use levalbuterol 2 puffs 5 to 15 minutes before activity to decrease cough or wheeze Continue Tezspire injections once every 28 days  for control of asthma. Previously been on [place]  Asthma control goals:  Full participation in all desired activities (may need albuterol before activity) Albuterol use two time or less a week on average (not counting use with activity) Cough interfering with sleep two time or less a month Oral  steroids no more than once a year No hospitalizations   Allergic rhinitis This continue allergen avoidance measures directed toward grass pollen, weed pollen, tree pollen, mold, dust mite, mouse, and feather as listed below Begin Allegra 180 mg once a day as needed for runny nose or itch. Remember to rotate to a different antihistamine about every 3 months. Some examples of over the counter antihistamines include Zyrtec (cetirizine), Xyzal (levocetirizine), Allegra (fexofenadine), and Claritin (loratidine).  Begin Xhance 2 sprays in each nostril twice a day as needed for stuffy nose Consider saline nasal rinses as needed for nasal symptoms. Use this before any medicated nasal sprays for best result Consider allergy injections if your symptoms are not well-managed with the treatment plan as listed above  Allergic conjunctivitis Some over the counter eye drops include Pataday one drop in each eye once a day as needed for red, itchy eyes OR Zaditor one drop in each eye twice a day as needed for red itchy eyes.  Sample of Pataday given.  Food allergy Continue to avoid tree nuts.  In case of an allergic reaction, take Benadryl 50 mg  every 4 hours, and if life-threatening symptoms occur, inject with EpiPen 0.3 mg.  Call the clinic if this treatment plan is not working well for you.  Follow up in 3 months or sooner if needed.  Reducing Pollen Exposure The American Academy of Allergy, Asthma and Immunology suggests the following steps to reduce your exposure to pollen during allergy seasons. Do not hang sheets or clothing out to dry; pollen may collect on these items. Do not mow lawns or  spend time around freshly cut grass; mowing stirs up pollen. Keep windows closed at night.  Keep car windows closed while driving. Minimize morning activities outdoors, a time when pollen counts are usually at their highest. Stay indoors as much as possible when pollen counts or humidity is high and on windy days when pollen tends to remain in the air longer. Use air conditioning when possible.  Many air conditioners have filters that trap the pollen spores. Use a HEPA room air filter to remove pollen form the indoor air you breathe.  Control of Mold Allergen Mold and fungi can grow on a variety of surfaces provided certain temperature and moisture conditions exist.  Outdoor molds grow on plants, decaying vegetation and soil.  The major outdoor mold, Alternaria and Cladosporium, are found in very high numbers during hot and dry conditions.  Generally, a late Summer - Fall peak is seen for common outdoor fungal spores.  Rain will temporarily lower outdoor mold spore count, but counts rise rapidly when the rainy period ends.  The most important indoor molds are Aspergillus and Penicillium.  Dark, humid and poorly ventilated basements are ideal sites for mold growth.  The next most common sites of mold growth are the bathroom and the kitchen.  Outdoor [employer]old Control Use air conditioning and keep windows closed Avoid exposure to decaying vegetation. Avoid leaf raking. Avoid grain handling. Consider wearing a face mask if working in moldy areas.  Indoor Mold Control Maintain humidity below 50%. Clean washable surfaces with 5% bleach solution. Remove sources e.g. Contaminated carpets.   Control of Dust Mite Allergen Dust mites play a major role in allergic asthma and rhinitis. They occur in environments with high humidity wherever human skin is found. Dust mites absorb humidity from the atmosphere (ie, they do not drink) and feed on organic matter (including shed human and animal skin). Dust mites  are a microscopic type of insect that you cannot see with the naked eye. High levels of dust mites have been detected from mattresses, pillows, carpets, upholstered furniture, bed covers, clothes, soft toys and any woven material. The principal allergen of the dust mite is found in its feces. A gram of dust may contain 1,000 mites and 250,000 fecal particles. Mite antigen is easily measured in the air during house cleaning activities. Dust mites do not bite and do not cause harm to humans, other than by triggering allergies/asthma.  Ways to decrease your exposure to dust mites in your home:  1. Encase mattresses, box springs and pillows with a mite-impermeable barrier or cover  2. Wash sheets, blankets and drapes weekly in hot water (130 F) with detergent and dry them in a dryer on the hot setting.  3. Have the room cleaned frequently with a vacuum cleaner and a damp dust-mop. For carpeting or rugs, vacuuming with a vacuum cleaner equipped with a high-efficiency particulate air (HEPA) filter. The dust mite allergic individual should not be in a room which is being cleaned and should wait 1 hour after cleaning before going into the room.  4. Do not sleep on upholstered furniture (eg, couches).  5. If possible removing carpeting, upholstered furniture and drapery from the home is ideal. Horizontal blinds should be eliminated in the rooms where the person spends the most time (bedroom, study, television room). Washable vinyl, roller-type shades are optimal.  6. Remove all non-washable stuffed toys from the bedroom. Wash stuffed toys weekly like sheets and blankets above.  7. Reduce indoor humidity to less than 50%. Inexpensive humidity monitors can be purchased at most hardware stores. Do not use a humidifier as can make the problem worse and are not recommended.   Return in about 3 months (around 01/14/2023), or if symptoms worsen or fail to improve.    Thank you for the opportunity to care for  this patient.  Please do not hesitate to contact me with questions.  Nehemiah Settle, FNP [facility]

## 2022-10-16 DIAGNOSIS — F4312 Post-traumatic stress disorder, chronic: Secondary | ICD-10-CM | POA: Diagnosis not present

## 2022-10-16 DIAGNOSIS — F908 Attention-deficit hyperactivity disorder, other type: Secondary | ICD-10-CM | POA: Diagnosis not present

## 2022-10-16 DIAGNOSIS — F411 Generalized anxiety disorder: Secondary | ICD-10-CM | POA: Diagnosis not present

## 2022-10-23 ENCOUNTER — Encounter (HOSPITAL_BASED_OUTPATIENT_CLINIC_OR_DEPARTMENT_OTHER): Payer: Self-pay | Admitting: Emergency Medicine

## 2022-10-23 ENCOUNTER — Emergency Department (HOSPITAL_BASED_OUTPATIENT_CLINIC_OR_DEPARTMENT_OTHER)
Admission: EM | Admit: 2022-10-23 | Discharge: 2022-10-23 | Disposition: A | Discharge status: Home / Self Care | Payer: BC Managed Care – PPO | Attending: Emergency Medicine | Admitting: Emergency Medicine

## 2022-10-23 ENCOUNTER — Other Ambulatory Visit: Payer: Self-pay

## 2022-10-23 ENCOUNTER — Emergency Department (HOSPITAL_BASED_OUTPATIENT_CLINIC_OR_DEPARTMENT_OTHER): Payer: BC Managed Care – PPO

## 2022-10-23 DIAGNOSIS — I129 Hypertensive chronic kidney disease with stage 1 through stage 4 chronic kidney disease, or unspecified chronic kidney disease: Secondary | ICD-10-CM | POA: Insufficient documentation

## 2022-10-23 DIAGNOSIS — Z7951 Long term (current) use of inhaled steroids: Secondary | ICD-10-CM | POA: Diagnosis not present

## 2022-10-23 DIAGNOSIS — Z79899 Other long term (current) drug therapy: Secondary | ICD-10-CM | POA: Insufficient documentation

## 2022-10-23 DIAGNOSIS — J45909 Unspecified asthma, uncomplicated: Secondary | ICD-10-CM | POA: Diagnosis not present

## 2022-10-23 DIAGNOSIS — R339 Retention of urine, unspecified: Secondary | ICD-10-CM | POA: Diagnosis not present

## 2022-10-23 DIAGNOSIS — R109 Unspecified abdominal pain: Secondary | ICD-10-CM | POA: Insufficient documentation

## 2022-10-23 DIAGNOSIS — N182 Chronic kidney disease, stage 2 (mild): Secondary | ICD-10-CM | POA: Diagnosis not present

## 2022-10-23 LAB — URINALYSIS, W/ REFLEX TO CULTURE (INFECTION SUSPECTED)
Bacteria, UA: NONE SEEN
Bilirubin Urine: NEGATIVE
Glucose, UA: NEGATIVE mg/dL
Hgb urine dipstick: NEGATIVE
Ketones, ur: NEGATIVE mg/dL
Leukocytes,Ua: NEGATIVE
Nitrite: NEGATIVE
Protein, ur: 30 mg/dL — AB
Specific Gravity, Urine: 1.016 (ref 1.005–1.030)
pH: 6.5 (ref 5.0–8.0)

## 2022-10-23 LAB — CBC
HCT: 35 % — ABNORMAL LOW (ref 39.0–52.0)
Hemoglobin: 12.2 g/dL — ABNORMAL LOW (ref 13.0–17.0)
MCH: 32.4 pg (ref 26.0–34.0)
MCHC: 34.9 g/dL (ref 30.0–36.0)
MCV: 93.1 fL (ref 80.0–100.0)
Platelets: 319 10*3/uL (ref 150–400)
RBC: 3.76 MIL/uL — ABNORMAL LOW (ref 4.22–5.81)
RDW: 11.9 % (ref 11.5–15.5)
WBC: 6.5 10*3/uL (ref 4.0–10.5)
nRBC: 0 % (ref 0.0–0.2)

## 2022-10-23 LAB — BASIC METABOLIC PANEL
Anion gap: 9 (ref 5–15)
BUN: 15 mg/dL (ref 6–20)
CO2: 27 mmol/L (ref 22–32)
Calcium: 9.4 mg/dL (ref 8.9–10.3)
Chloride: 107 mmol/L (ref 98–111)
Creatinine, Ser: 1.18 mg/dL (ref 0.61–1.24)
GFR, Estimated: >60 mL/min (ref >60)
Glucose, Bld: 101 mg/dL — ABNORMAL HIGH (ref 70–99)
Potassium: 4.2 mmol/L (ref 3.5–5.1)
Sodium: 143 mmol/L (ref 135–145)

## 2022-10-23 MED ORDER — IBUPROFEN 800 MG PO TABS: 800.0000 mg | ORAL_TABLET | Freq: Three times a day (TID) | ORAL | 0 refills | Status: DC

## 2022-10-23 MED ORDER — METHOCARBAMOL 500 MG PO TABS: 500.0000 mg | ORAL_TABLET | Freq: Two times a day (BID) | ORAL | 0 refills | Status: DC | PRN

## 2022-10-23 MED ORDER — HYDROCODONE-ACETAMINOPHEN 5-325 MG PO TABS
1.0000 | ORAL_TABLET | Freq: Once | ORAL | Status: AC
  Administered 2022-10-23: 1 via ORAL
  Filled 2022-10-23: qty 1

## 2022-10-23 NOTE — ED Triage Notes (Signed)
Pt arrives to ED with c/o urinary retention x16 hours. Hx renal disorder.

## 2022-10-23 NOTE — ED Provider Notes (Signed)
[facility] EMERGENCY DEPARTMENT AT [facility] Provider Note   CSN: [id number] Arrival date & time: 10/23/22  1031     History  Chief Complaint  Patient presents with   Urinary Retention    James Matthews is a 27 y.o. male.  HPI   27 year old male presents emergency department with complaints of acute onset urinary retention.  Patient states his symptoms began approximately 16 hours ago.  States over the past week, has had increased difficulty urinating taking approximately 20 minutes to feel as if he is emptying bladder.  Has had increased straining.  Has noticed increasing in left flank pain since symptom onset approximate 1 week ago.  States he had a CT scan on March 7 which showed possible interstitial cystitis being worked up by urology outpatient.  Patient states that he has had similar occurrence 4 to 5 years ago secondary to posterior urethral valves.  Patient follows urology through [facility] for symptoms.  Denies fever, chills, chest pain, shortness of breath, hematuria, bowel incontinence, weakness/sensory deficit lower extremities, saddle anesthesia, history of IV drug use.  Past medical history significant for asthma, hypertension, IBS, orthostatic hypotension, posterior urethral valves, renal disorder, bladder spasm, CKD stage II  Home Medications Prior to Admission medications   Medication Sig Start Date End Date Taking? Authorizing Provider  methocarbamol (ROBAXIN) 500 MG tablet Take 1 tablet (500 mg total) by mouth 2 (two) times daily as needed for muscle spasms. 10/23/22  Yes Sherian Maroon A, PA  ALPRAZolam Prudy Feeler) 1 MG tablet Take 1 mg by mouth 2 (two) times daily as needed for anxiety.     [provider]  amitriptyline (ELAVIL) 10 MG tablet Take 10 mg by mouth at bedtime. 06/06/21   [provider]  amphetamine-dextroamphetamine (ADDERALL) 20 MG tablet Take 1 tablet (20 mg total) by mouth daily. Patient not taking: Reported on  10/15/2022 07/10/22     beclomethasone (QVAR REDIHALER) 80 MCG/ACT inhaler INHALE 2 PUFFS INTO THE LUNGS TWICE A DAY 06/27/22   Ambs, Norvel Richards, FNP  busPIRone (BUSPAR) 30 MG tablet TAKE 1 TABLET BY MOUTH TWICE A DAY 05/30/20   Shelva Majestic, MD  desvenlafaxine (PRISTIQ) 100 MG 24 hr tablet Take 200 mg by mouth daily. 10/30/20   [provider]  emtricitabine-tenofovir (TRUVADA) 200-300 MG tablet Take 1 tablet by mouth as needed. 02/25/22   [provider]  EPINEPHrine 0.3 mg/0.3 mL IJ SOAJ injection INJECT 0.3 MLS INTO THE MUSCLE ONCE FOR 1 DOSE. AS DIRECTED FOR LIFE-THREATENING ALLERGIC REACTIONS 06/27/22   Ambs, Norvel Richards, FNP  Erenumab-aooe (AIMOVIG) 140 MG/ML SOAJ Inject 140 mg into the skin every 30 (thirty) days. 03/27/22   Drema Dallas, DO  Fluticasone Propionate (XHANCE) 93 MCG/ACT EXHU Place 2 puffs into the nose 2 (two) times daily as needed. 06/27/22   Hetty Blend, FNP  hydrOXYzine (ATARAX) 25 MG tablet TAKE 1-2 TABLETS 30-60 MINUTES PRIOR TO BEDTIME FOR INSOMNIA, ANXIETY. Patient not taking: Reported on 10/15/2022 01/29/22   Marcelyn Bruins, MD  levalbuterol [facility] HFA) 45 MCG/ACT inhaler Inhale 2 puffs into the lungs every 4 (four) hours as needed for wheezing. 06/27/22   Hetty Blend, FNP  levalbuterol (XOPENEX) 1.25 MG/3ML nebulizer solution 1 vial via nebulizer every 4-6 hours as needed. 09/01/20   Marcelyn Bruins, MD  lisdexamfetamine (VYVANSE) 30 MG capsule Take 1 capsule (30 mg total) by mouth daily. Patient not taking: Reported on 10/15/2022 08/02/22     lisdexamfetamine (VYVANSE)  40 MG capsule Take 1 capsule (40 mg total) by mouth daily. 09/19/22     losartan (COZAAR) 50 MG tablet Take 1.5 tablets (75 mg total) by mouth daily. Patient not taking: Reported on 10/15/2022 12/15/20   Shelva Majestic, MD  ondansetron (ZOFRAN-ODT) 8 MG disintegrating tablet Take 1 tablet (8 mg total) by mouth every 8 (eight) hours as needed for nausea or vomiting. 07/27/21    Drema Dallas, DO  predniSONE (DELTASONE) 10 MG tablet Take one tablet twice a day for 3 days, then on the 4th day take one tablet and stop 10/15/22   Nehemiah Settle, FNP  rizatriptan (MAXALT) 10 MG tablet Take 1 tablet earliest onset of migraine.  May repeat in 2 hours if needed.  Maximum 2 tablets in 24 hours Patient not taking: Reported on 03/27/2022 07/27/21   Drema Dallas, DO  Tezepelumab-ekko (TEZSPIRE) 210 MG/1. SOAJ Inject 210 mg into the skin every 28 (twenty-eight) days. 12/07/21   Marcelyn Bruins, MD  tretinoin (RETIN-A) 0.05 % cream Apply 0.05 % topically. 06/19/22   [provider]  zolpidem (AMBIEN) 10 MG tablet Take 10 mg by mouth at bedtime as needed. 09/26/22   [provider]      Allergies    Albuterol, Chocolate, Lisinopril, and Other    Review of Systems   Review of Systems  All other systems reviewed and are negative.   Physical Exam Updated Vital Signs BP 131/88 (BP Location: Right Arm)   Pulse 78   Temp 98.9 F (37.2 C) (Oral)   Resp 18   Ht 6\' 1"  (1.854 m)   Wt 74.6 kg   SpO2 98%   BMI 21.69 kg/m  Physical Exam Vitals and nursing note reviewed.  Constitutional:      General: He is not in acute distress.    Appearance: He is well-developed.  HENT:     Head: Normocephalic and atraumatic.  Eyes:     Conjunctiva/sclera: Conjunctivae normal.  Cardiovascular:     Rate and Rhythm: Normal rate and regular rhythm.     Pulses: Normal pulses.     Heart sounds: No murmur heard. Pulmonary:     Effort: Pulmonary effort is normal. No respiratory distress.     Breath sounds: Normal breath sounds.  Abdominal:     Palpations: Abdomen is soft.     Tenderness: There is abdominal tenderness. There is left CVA tenderness. There is no right CVA tenderness.     Comments: Distention and suprapubic region with overlying tenderness to palpation.  Musculoskeletal:        General: No swelling.     Cervical back: Neck supple.     Right lower  leg: No edema.     Left lower leg: No edema.     Comments: Most recent 5 out of 5 for knee flexion/extension, hip flexion/extension, ankle dorsi/plantarflexion.  No sensory deficits along major nerve distributions of lower extremities.  DTRs symmetric and equal bilaterally patellarly.   Skin:    General: Skin is warm and dry.     Capillary Refill: Capillary refill takes less than 2 seconds.  Neurological:     Mental Status: He is alert.  Psychiatric:        Mood and Affect: Mood normal.     ED Results / Procedures / Treatments   Labs (all labs ordered are listed, but only abnormal results are displayed) Labs Reviewed  BASIC METABOLIC PANEL - Abnormal; Notable for the following components:  Result Value   Glucose, Bld 101 (*)    All other components within normal limits  CBC - Abnormal; Notable for the following components:   RBC 3.76 (*)    Hemoglobin 12.2 (*)    HCT 35.0 (*)    All other components within normal limits  URINALYSIS, W/ REFLEX TO CULTURE (INFECTION SUSPECTED) - Abnormal; Notable for the following components:   Protein, ur 30 (*)    All other components within normal limits    EKG None  Radiology CT Renal Stone Study  Result Date: 10/23/2022 CLINICAL DATA:  Abdominal/flank pain. Stone suspected. Left flank pain. History of right nephrectomy. EXAM: CT ABDOMEN AND PELVIS WITHOUT CONTRAST TECHNIQUE: Multidetector CT imaging of the abdomen and pelvis was performed following the standard protocol without IV contrast. RADIATION DOSE REDUCTION: This exam was performed according to the departmental dose-optimization program which includes automated exposure control, adjustment of the mA and/or kV according to patient size and/or use of iterative reconstruction technique. COMPARISON:  CT abdomen/pelvis 05/20/2017. FINDINGS: Lower chest: No acute abnormality. Hepatobiliary: No focal liver abnormality is seen. No gallstones, gallbladder wall thickening, or biliary  dilatation. Pancreas: Unremarkable. No pancreatic ductal dilatation or surrounding inflammatory changes. Spleen: Normal in size without focal abnormality. Adrenals/Urinary Tract: The adrenal glands are unremarkable. Prior right nephrectomy. Unchanged extrarenal pelvis on the left. No mass or hydronephrosis. No renal calculi. Foley catheter in-situ. Stomach/Bowel: Normal stomach and duodenum. No dilated loops of small bowel. Normal appendix is visualized on axial image 45 series 2. No bowel wall thickening or surrounding inflammation. Vascular/Lymphatic: No significant vascular findings are present. No enlarged abdominal or pelvic lymph nodes. Reproductive: Prostate is unremarkable. Other: Unchanged defect along the left abdominal wall adjacent to the left kidney. No abdominal or pelvic ascites. Musculoskeletal: No acute or significant osseous findings. IMPRESSION: 1. No acute findings in the abdomen or pelvis. Specifically, no urinary calculi. 2. Prior right nephrectomy. Electronically Signed   By: Orvan Falconer M.D.   On: 10/23/2022 12:28    Procedures Procedures    Medications Ordered in ED Medications  HYDROcodone-acetaminophen (NORCO/VICODIN) 5-325 MG per tablet 1 tablet (1 tablet Oral Given 10/23/22 1115)    ED Course/ Medical Decision Making/ A&P Clinical Course as of 10/23/22 1500  Tue Oct 23, 2022  1133 Bedside bladder scan showed greater than 900 cc of fluid in bladder. [CR]    Clinical Course User Index [CR] Peter Garter, PA                             Medical Decision Making Amount and/or Complexity of Data Reviewed Labs: ordered. Radiology: ordered.  Risk Prescription drug management.   This patient presents to the ED for concern of urinary retention, this involves an extensive number of treatment options, and is a complaint that carries with it a high risk of complications and morbidity.  The differential diagnosis includes bladder outlet obstruction, cauda equina,  nephrolithiasis, BPH, malignancy   Co morbidities that complicate the patient evaluation  See HPI   Additional history obtained:  Additional history obtained from EMR External records from outside source obtained and reviewed including hospital records   Lab Tests:  I Ordered, and personally interpreted labs.  The pertinent results include: No leukocytosis.  Mild dyslipidemia with a hemoglobin of 12.2 of which is decreased from laboratory studies approximately 1 year ago.  UA with 30 protein but otherwise unremarkable.  No electrolyte abnormalities.  No renal  dysfunction.   Imaging Studies ordered:  I ordered imaging studies including CT renal stone study I independently visualized and interpreted imaging which showed no acute intra-abdominal pathology I agree with the radiologist interpretation   Cardiac Monitoring: / EKG:  The patient was maintained on a cardiac monitor.  I personally viewed and interpreted the cardiac monitored which showed an underlying rhythm of: sinus rhythm   Consultations Obtained:  N/a   Problem List / ED Course / Critical interventions / Medication management  Urinary retention I ordered medication including Norco   Reevaluation of the patient after these medicines showed that the patient improved I have reviewed the patients home medicines and have made adjustments as needed   Social Determinants of Health:  Denies tobacco, illicit drug use   Test / Admission - Considered:  Urinary retention Vitals signs within normal range and stable throughout visit. Laboratory/imaging studies significant for: See above 27 year old male presents emergency department with complaints of urinary retention.  Symptoms seem to be secondary to mechanical obstruction of urethra secondary to reported patient history of similar symptoms in the past as well as evidence of posterior urethral valves upon history.  Low suspicion for neurogenic bladder or evidence  of cauda equina.  Patient noted significant improvement of symptoms with administration of indwelling Foley catheter.  Patient without evidence of acute kidney injury or urinary tract infection.  Patient has upcoming appointment with urology tomorrow for reassessment.  Will be sent home with indwelling Foley catheter.  Treatment plan discussed at length with patient and he acknowledged understanding was agreeable to said plan. Worrisome signs and symptoms were discussed with the patient, and the patient acknowledged understanding to return to the ED if noticed. Patient was stable upon discharge.          Final Clinical Impression(s) / ED Diagnoses Final diagnoses:  Urinary retention    Rx / DC Orders ED Discharge Orders          Ordered    ibuprofen (ADVIL) 800 MG tablet  3 times daily,   Status:  Discontinued        10/23/22 1341    methocarbamol (ROBAXIN) 500 MG tablet  2 times daily PRN        10/23/22 1342              Peter Garter, [place] 10/23/22 1500    Margarita Grizzle, MD 10/24/22 [phone]

## 2022-10-23 NOTE — Discharge Instructions (Addendum)
Note the workup today was overall reassuring.  Attached is information for catheter care at home.  Urinary retention most likely secondary to urethral valves.  CT scan of the abdomen pelvis showed no stone or swelling of your left kidney.  Kidney function was normal.  Please call your urologist to set up an appointment for reevaluation.  Please do not hesitate to return to emergency department for worrisome signs and symptoms we discussed to become apparent.

## 2022-10-24 DIAGNOSIS — R1032 Left lower quadrant pain: Secondary | ICD-10-CM | POA: Diagnosis not present

## 2022-10-24 DIAGNOSIS — Q642 Congenital posterior urethral valves: Secondary | ICD-10-CM | POA: Diagnosis not present

## 2022-10-24 DIAGNOSIS — N3010 Interstitial cystitis (chronic) without hematuria: Secondary | ICD-10-CM | POA: Diagnosis not present

## 2022-10-24 DIAGNOSIS — R339 Retention of urine, unspecified: Secondary | ICD-10-CM | POA: Diagnosis not present

## 2022-10-25 ENCOUNTER — Encounter: Payer: Self-pay | Admitting: Neurology

## 2022-10-26 ENCOUNTER — Other Ambulatory Visit (HOSPITAL_BASED_OUTPATIENT_CLINIC_OR_DEPARTMENT_OTHER): Payer: Self-pay

## 2022-10-26 MED ORDER — LISDEXAMFETAMINE DIMESYLATE 40 MG PO CAPS
40.0000 mg | ORAL_CAPSULE | Freq: Every day | ORAL | 0 refills | Status: DC
  Filled 2022-10-26: qty 30, 30d supply, fill #0

## 2022-10-29 ENCOUNTER — Ambulatory Visit: Payer: BC Managed Care – PPO

## 2022-10-29 DIAGNOSIS — Q642 Congenital posterior urethral valves: Secondary | ICD-10-CM | POA: Diagnosis not present

## 2022-10-29 DIAGNOSIS — R109 Unspecified abdominal pain: Secondary | ICD-10-CM | POA: Diagnosis not present

## 2022-10-29 DIAGNOSIS — N3010 Interstitial cystitis (chronic) without hematuria: Secondary | ICD-10-CM | POA: Diagnosis not present

## 2022-10-29 DIAGNOSIS — N398 Other specified disorders of urinary system: Secondary | ICD-10-CM | POA: Diagnosis not present

## 2022-10-29 DIAGNOSIS — R339 Retention of urine, unspecified: Secondary | ICD-10-CM | POA: Diagnosis not present

## 2022-10-30 DIAGNOSIS — F908 Attention-deficit hyperactivity disorder, other type: Secondary | ICD-10-CM | POA: Diagnosis not present

## 2022-10-30 DIAGNOSIS — F4312 Post-traumatic stress disorder, chronic: Secondary | ICD-10-CM | POA: Diagnosis not present

## 2022-10-30 DIAGNOSIS — F411 Generalized anxiety disorder: Secondary | ICD-10-CM | POA: Diagnosis not present

## 2022-11-06 DIAGNOSIS — N3010 Interstitial cystitis (chronic) without hematuria: Secondary | ICD-10-CM | POA: Diagnosis not present

## 2022-11-06 DIAGNOSIS — Z905 Acquired absence of kidney: Secondary | ICD-10-CM | POA: Diagnosis not present

## 2022-11-06 DIAGNOSIS — Z87442 Personal history of urinary calculi: Secondary | ICD-10-CM | POA: Diagnosis not present

## 2022-11-06 DIAGNOSIS — J45909 Unspecified asthma, uncomplicated: Secondary | ICD-10-CM | POA: Diagnosis not present

## 2022-11-06 DIAGNOSIS — F32A Depression, unspecified: Secondary | ICD-10-CM | POA: Diagnosis not present

## 2022-11-06 DIAGNOSIS — Z01818 Encounter for other preprocedural examination: Secondary | ICD-10-CM | POA: Diagnosis not present

## 2022-11-06 DIAGNOSIS — Z79899 Other long term (current) drug therapy: Secondary | ICD-10-CM | POA: Diagnosis not present

## 2022-11-06 DIAGNOSIS — J8283 Eosinophilic asthma: Secondary | ICD-10-CM | POA: Diagnosis not present

## 2022-11-06 DIAGNOSIS — Q642 Congenital posterior urethral valves: Secondary | ICD-10-CM | POA: Diagnosis not present

## 2022-11-06 DIAGNOSIS — D631 Anemia in chronic kidney disease: Secondary | ICD-10-CM | POA: Diagnosis not present

## 2022-11-06 DIAGNOSIS — I10 Essential (primary) hypertension: Secondary | ICD-10-CM | POA: Diagnosis not present

## 2022-11-06 DIAGNOSIS — F909 Attention-deficit hyperactivity disorder, unspecified type: Secondary | ICD-10-CM | POA: Diagnosis not present

## 2022-11-06 DIAGNOSIS — N1832 Chronic kidney disease, stage 3b: Secondary | ICD-10-CM | POA: Diagnosis not present

## 2022-11-06 DIAGNOSIS — F419 Anxiety disorder, unspecified: Secondary | ICD-10-CM | POA: Diagnosis not present

## 2022-11-06 DIAGNOSIS — Q620 Congenital hydronephrosis: Secondary | ICD-10-CM | POA: Diagnosis not present

## 2022-11-06 DIAGNOSIS — G4700 Insomnia, unspecified: Secondary | ICD-10-CM | POA: Diagnosis not present

## 2022-11-06 DIAGNOSIS — G43909 Migraine, unspecified, not intractable, without status migrainosus: Secondary | ICD-10-CM | POA: Diagnosis not present

## 2022-11-06 DIAGNOSIS — N189 Chronic kidney disease, unspecified: Secondary | ICD-10-CM | POA: Diagnosis not present

## 2022-11-06 DIAGNOSIS — J45998 Other asthma: Secondary | ICD-10-CM | POA: Diagnosis not present

## 2022-11-07 DIAGNOSIS — F908 Attention-deficit hyperactivity disorder, other type: Secondary | ICD-10-CM | POA: Diagnosis not present

## 2022-11-07 DIAGNOSIS — F411 Generalized anxiety disorder: Secondary | ICD-10-CM | POA: Diagnosis not present

## 2022-11-07 DIAGNOSIS — F4312 Post-traumatic stress disorder, chronic: Secondary | ICD-10-CM | POA: Diagnosis not present

## 2022-11-08 ENCOUNTER — Ambulatory Visit (INDEPENDENT_AMBULATORY_CARE_PROVIDER_SITE_OTHER): Payer: BC Managed Care – PPO

## 2022-11-08 DIAGNOSIS — J4550 Severe persistent asthma, uncomplicated: Secondary | ICD-10-CM | POA: Diagnosis not present

## 2022-11-08 DIAGNOSIS — R5383 Other fatigue: Secondary | ICD-10-CM | POA: Diagnosis not present

## 2022-11-08 DIAGNOSIS — R195 Other fecal abnormalities: Secondary | ICD-10-CM | POA: Diagnosis not present

## 2022-11-08 DIAGNOSIS — D649 Anemia, unspecified: Secondary | ICD-10-CM | POA: Diagnosis not present

## 2022-11-13 DIAGNOSIS — F411 Generalized anxiety disorder: Secondary | ICD-10-CM | POA: Diagnosis not present

## 2022-11-13 DIAGNOSIS — F908 Attention-deficit hyperactivity disorder, other type: Secondary | ICD-10-CM | POA: Diagnosis not present

## 2022-11-13 DIAGNOSIS — F4312 Post-traumatic stress disorder, chronic: Secondary | ICD-10-CM | POA: Diagnosis not present

## 2022-11-14 DIAGNOSIS — F411 Generalized anxiety disorder: Secondary | ICD-10-CM | POA: Diagnosis not present

## 2022-11-14 DIAGNOSIS — G4700 Insomnia, unspecified: Secondary | ICD-10-CM | POA: Diagnosis not present

## 2022-11-14 DIAGNOSIS — F330 Major depressive disorder, recurrent, mild: Secondary | ICD-10-CM | POA: Diagnosis not present

## 2022-11-19 DIAGNOSIS — Q642 Congenital posterior urethral valves: Secondary | ICD-10-CM | POA: Diagnosis not present

## 2022-11-19 DIAGNOSIS — N3010 Interstitial cystitis (chronic) without hematuria: Secondary | ICD-10-CM | POA: Diagnosis not present

## 2022-11-19 DIAGNOSIS — N200 Calculus of kidney: Secondary | ICD-10-CM | POA: Diagnosis not present

## 2022-11-19 DIAGNOSIS — N398 Other specified disorders of urinary system: Secondary | ICD-10-CM | POA: Diagnosis not present

## 2022-11-20 DIAGNOSIS — Z79899 Other long term (current) drug therapy: Secondary | ICD-10-CM | POA: Diagnosis not present

## 2022-11-20 DIAGNOSIS — D414 Neoplasm of uncertain behavior of bladder: Secondary | ICD-10-CM | POA: Diagnosis not present

## 2022-11-20 DIAGNOSIS — N3000 Acute cystitis without hematuria: Secondary | ICD-10-CM | POA: Diagnosis not present

## 2022-11-20 DIAGNOSIS — N319 Neuromuscular dysfunction of bladder, unspecified: Secondary | ICD-10-CM | POA: Diagnosis not present

## 2022-11-20 DIAGNOSIS — D649 Anemia, unspecified: Secondary | ICD-10-CM | POA: Diagnosis not present

## 2022-11-20 DIAGNOSIS — N3010 Interstitial cystitis (chronic) without hematuria: Secondary | ICD-10-CM | POA: Diagnosis not present

## 2022-11-20 DIAGNOSIS — I129 Hypertensive chronic kidney disease with stage 1 through stage 4 chronic kidney disease, or unspecified chronic kidney disease: Secondary | ICD-10-CM | POA: Diagnosis not present

## 2022-11-20 DIAGNOSIS — E559 Vitamin D deficiency, unspecified: Secondary | ICD-10-CM | POA: Diagnosis not present

## 2022-11-20 DIAGNOSIS — N189 Chronic kidney disease, unspecified: Secondary | ICD-10-CM | POA: Diagnosis not present

## 2022-11-20 DIAGNOSIS — F909 Attention-deficit hyperactivity disorder, unspecified type: Secondary | ICD-10-CM | POA: Diagnosis not present

## 2022-11-20 DIAGNOSIS — J45909 Unspecified asthma, uncomplicated: Secondary | ICD-10-CM | POA: Diagnosis not present

## 2022-11-20 DIAGNOSIS — N3020 Other chronic cystitis without hematuria: Secondary | ICD-10-CM | POA: Diagnosis not present

## 2022-11-22 ENCOUNTER — Other Ambulatory Visit: Payer: Self-pay | Admitting: Family Medicine

## 2022-11-23 DIAGNOSIS — F4312 Post-traumatic stress disorder, chronic: Secondary | ICD-10-CM | POA: Diagnosis not present

## 2022-11-23 DIAGNOSIS — F908 Attention-deficit hyperactivity disorder, other type: Secondary | ICD-10-CM | POA: Diagnosis not present

## 2022-11-23 DIAGNOSIS — F411 Generalized anxiety disorder: Secondary | ICD-10-CM | POA: Diagnosis not present

## 2022-11-26 ENCOUNTER — Other Ambulatory Visit (HOSPITAL_BASED_OUTPATIENT_CLINIC_OR_DEPARTMENT_OTHER): Payer: Self-pay

## 2022-11-27 ENCOUNTER — Other Ambulatory Visit (HOSPITAL_BASED_OUTPATIENT_CLINIC_OR_DEPARTMENT_OTHER): Payer: Self-pay

## 2022-11-27 ENCOUNTER — Other Ambulatory Visit: Payer: Self-pay

## 2022-11-27 MED ORDER — LISDEXAMFETAMINE DIMESYLATE 40 MG PO CAPS
40.0000 mg | ORAL_CAPSULE | Freq: Every day | ORAL | 0 refills | Status: DC
  Filled 2022-11-27: qty 30, 30d supply, fill #0

## 2022-11-29 DIAGNOSIS — R339 Retention of urine, unspecified: Secondary | ICD-10-CM | POA: Diagnosis not present

## 2022-11-30 DIAGNOSIS — F908 Attention-deficit hyperactivity disorder, other type: Secondary | ICD-10-CM | POA: Diagnosis not present

## 2022-11-30 DIAGNOSIS — F4312 Post-traumatic stress disorder, chronic: Secondary | ICD-10-CM | POA: Diagnosis not present

## 2022-11-30 DIAGNOSIS — F411 Generalized anxiety disorder: Secondary | ICD-10-CM | POA: Diagnosis not present

## 2022-12-04 ENCOUNTER — Ambulatory Visit (INDEPENDENT_AMBULATORY_CARE_PROVIDER_SITE_OTHER): Payer: BC Managed Care – PPO | Admitting: *Deleted

## 2022-12-04 DIAGNOSIS — F908 Attention-deficit hyperactivity disorder, other type: Secondary | ICD-10-CM | POA: Diagnosis not present

## 2022-12-04 DIAGNOSIS — F4312 Post-traumatic stress disorder, chronic: Secondary | ICD-10-CM | POA: Diagnosis not present

## 2022-12-04 DIAGNOSIS — F411 Generalized anxiety disorder: Secondary | ICD-10-CM | POA: Diagnosis not present

## 2022-12-04 DIAGNOSIS — J4550 Severe persistent asthma, uncomplicated: Secondary | ICD-10-CM

## 2022-12-05 DIAGNOSIS — R339 Retention of urine, unspecified: Secondary | ICD-10-CM | POA: Diagnosis not present

## 2022-12-07 DIAGNOSIS — F4312 Post-traumatic stress disorder, chronic: Secondary | ICD-10-CM | POA: Diagnosis not present

## 2022-12-07 DIAGNOSIS — F908 Attention-deficit hyperactivity disorder, other type: Secondary | ICD-10-CM | POA: Diagnosis not present

## 2022-12-07 DIAGNOSIS — F411 Generalized anxiety disorder: Secondary | ICD-10-CM | POA: Diagnosis not present

## 2022-12-11 DIAGNOSIS — F4312 Post-traumatic stress disorder, chronic: Secondary | ICD-10-CM | POA: Diagnosis not present

## 2022-12-11 DIAGNOSIS — F908 Attention-deficit hyperactivity disorder, other type: Secondary | ICD-10-CM | POA: Diagnosis not present

## 2022-12-11 DIAGNOSIS — F411 Generalized anxiety disorder: Secondary | ICD-10-CM | POA: Diagnosis not present

## 2022-12-13 DIAGNOSIS — Z905 Acquired absence of kidney: Secondary | ICD-10-CM | POA: Diagnosis not present

## 2022-12-13 DIAGNOSIS — R109 Unspecified abdominal pain: Secondary | ICD-10-CM | POA: Diagnosis not present

## 2022-12-13 DIAGNOSIS — N3010 Interstitial cystitis (chronic) without hematuria: Secondary | ICD-10-CM | POA: Diagnosis not present

## 2022-12-13 DIAGNOSIS — N398 Other specified disorders of urinary system: Secondary | ICD-10-CM | POA: Diagnosis not present

## 2022-12-13 DIAGNOSIS — Q642 Congenital posterior urethral valves: Secondary | ICD-10-CM | POA: Diagnosis not present

## 2022-12-13 DIAGNOSIS — N319 Neuromuscular dysfunction of bladder, unspecified: Secondary | ICD-10-CM | POA: Diagnosis not present

## 2022-12-13 DIAGNOSIS — N182 Chronic kidney disease, stage 2 (mild): Secondary | ICD-10-CM | POA: Diagnosis not present

## 2022-12-17 ENCOUNTER — Other Ambulatory Visit: Payer: Self-pay | Admitting: *Deleted

## 2022-12-17 DIAGNOSIS — R339 Retention of urine, unspecified: Secondary | ICD-10-CM | POA: Diagnosis not present

## 2022-12-17 MED ORDER — TEZSPIRE 210 MG/1.91ML ~~LOC~~ SOAJ: 210.0000 mg | SUBCUTANEOUS | 11 refills | Status: AC

## 2022-12-18 DIAGNOSIS — F411 Generalized anxiety disorder: Secondary | ICD-10-CM | POA: Diagnosis not present

## 2022-12-18 DIAGNOSIS — F4312 Post-traumatic stress disorder, chronic: Secondary | ICD-10-CM | POA: Diagnosis not present

## 2022-12-18 DIAGNOSIS — F908 Attention-deficit hyperactivity disorder, other type: Secondary | ICD-10-CM | POA: Diagnosis not present

## 2022-12-24 ENCOUNTER — Other Ambulatory Visit (HOSPITAL_BASED_OUTPATIENT_CLINIC_OR_DEPARTMENT_OTHER): Payer: Self-pay

## 2022-12-24 MED ORDER — LISDEXAMFETAMINE DIMESYLATE 40 MG PO CAPS
40.0000 mg | ORAL_CAPSULE | Freq: Every day | ORAL | 0 refills | Status: AC
  Filled 2022-12-24 – 2023-01-02 (×2): qty 30, 30d supply, fill #0

## 2022-12-25 DIAGNOSIS — F4312 Post-traumatic stress disorder, chronic: Secondary | ICD-10-CM | POA: Diagnosis not present

## 2022-12-25 DIAGNOSIS — F411 Generalized anxiety disorder: Secondary | ICD-10-CM | POA: Diagnosis not present

## 2022-12-25 DIAGNOSIS — F908 Attention-deficit hyperactivity disorder, other type: Secondary | ICD-10-CM | POA: Diagnosis not present

## 2023-01-01 ENCOUNTER — Encounter: Payer: Self-pay | Admitting: Neurology

## 2023-01-01 ENCOUNTER — Ambulatory Visit (INDEPENDENT_AMBULATORY_CARE_PROVIDER_SITE_OTHER): Payer: BC Managed Care – PPO | Admitting: *Deleted

## 2023-01-01 ENCOUNTER — Telehealth: Payer: Self-pay

## 2023-01-01 DIAGNOSIS — J4550 Severe persistent asthma, uncomplicated: Secondary | ICD-10-CM

## 2023-01-01 DIAGNOSIS — F4312 Post-traumatic stress disorder, chronic: Secondary | ICD-10-CM | POA: Diagnosis not present

## 2023-01-01 DIAGNOSIS — F411 Generalized anxiety disorder: Secondary | ICD-10-CM | POA: Diagnosis not present

## 2023-01-01 DIAGNOSIS — F908 Attention-deficit hyperactivity disorder, other type: Secondary | ICD-10-CM | POA: Diagnosis not present

## 2023-01-01 NOTE — Telephone Encounter (Signed)
Per patient Pa needed for Aimovig.

## 2023-01-02 ENCOUNTER — Other Ambulatory Visit (HOSPITAL_BASED_OUTPATIENT_CLINIC_OR_DEPARTMENT_OTHER): Payer: Self-pay

## 2023-01-02 ENCOUNTER — Other Ambulatory Visit: Payer: Self-pay | Admitting: Family Medicine

## 2023-01-02 DIAGNOSIS — F411 Generalized anxiety disorder: Secondary | ICD-10-CM | POA: Diagnosis not present

## 2023-01-02 DIAGNOSIS — F909 Attention-deficit hyperactivity disorder, unspecified type: Secondary | ICD-10-CM | POA: Diagnosis not present

## 2023-01-02 DIAGNOSIS — Q642 Congenital posterior urethral valves: Secondary | ICD-10-CM | POA: Diagnosis not present

## 2023-01-02 DIAGNOSIS — R482 Apraxia: Secondary | ICD-10-CM | POA: Diagnosis not present

## 2023-01-03 ENCOUNTER — Other Ambulatory Visit: Payer: Self-pay | Admitting: Family

## 2023-01-03 ENCOUNTER — Telehealth: Payer: Self-pay

## 2023-01-03 NOTE — Telephone Encounter (Signed)
PA submitted and is pending additional questions/determination. Will be updated in additional encounter created.

## 2023-01-03 NOTE — Telephone Encounter (Signed)
*  LBN  PA request received for Aimovig 140MG /ML auto-injectors  PA submitted to [facility] and is pending additional questions/determination  Key: [id number]

## 2023-01-04 NOTE — Telephone Encounter (Signed)
Can we please try for PA it has albuterol listed as causing tachycardia

## 2023-01-04 NOTE — Telephone Encounter (Signed)
PA Approved through 01/03/24

## 2023-01-07 ENCOUNTER — Other Ambulatory Visit (HOSPITAL_COMMUNITY): Payer: Self-pay

## 2023-01-07 ENCOUNTER — Telehealth: Payer: Self-pay

## 2023-01-07 NOTE — Telephone Encounter (Signed)
PA submitted and is pending determination, will be updated in additional encounter created.

## 2023-01-07 NOTE — Telephone Encounter (Signed)
*  Asthma/Allergy    PA request received for Levalbuterol Tartrate 45MCG/ACT aerosol  PA submitted to [employer] via [employer] and is pending additional questions/determination  *per notes albuterol caused tachycardia  Key:  [id number]

## 2023-01-08 ENCOUNTER — Other Ambulatory Visit (HOSPITAL_COMMUNITY): Payer: Self-pay

## 2023-01-08 DIAGNOSIS — F4312 Post-traumatic stress disorder, chronic: Secondary | ICD-10-CM | POA: Diagnosis not present

## 2023-01-08 DIAGNOSIS — F411 Generalized anxiety disorder: Secondary | ICD-10-CM | POA: Diagnosis not present

## 2023-01-08 DIAGNOSIS — F908 Attention-deficit hyperactivity disorder, other type: Secondary | ICD-10-CM | POA: Diagnosis not present

## 2023-01-09 ENCOUNTER — Other Ambulatory Visit: Payer: Self-pay

## 2023-01-09 NOTE — Telephone Encounter (Signed)
Received authorization notification from [facility].  Faxed to [facility]. Authorized from 01/07/2023 - 01/07/2024 Reference number: [id number]

## 2023-01-15 DIAGNOSIS — F411 Generalized anxiety disorder: Secondary | ICD-10-CM | POA: Diagnosis not present

## 2023-01-15 DIAGNOSIS — F908 Attention-deficit hyperactivity disorder, other type: Secondary | ICD-10-CM | POA: Diagnosis not present

## 2023-01-15 DIAGNOSIS — F4312 Post-traumatic stress disorder, chronic: Secondary | ICD-10-CM | POA: Diagnosis not present

## 2023-01-16 ENCOUNTER — Encounter: Payer: Self-pay | Admitting: Allergy

## 2023-01-16 ENCOUNTER — Ambulatory Visit (INDEPENDENT_AMBULATORY_CARE_PROVIDER_SITE_OTHER): Payer: BC Managed Care – PPO | Admitting: Allergy

## 2023-01-16 ENCOUNTER — Other Ambulatory Visit: Payer: Self-pay

## 2023-01-16 VITALS — BP 130/80 | HR 101 | Temp 98.7°F | Resp 18

## 2023-01-16 DIAGNOSIS — J3089 Other allergic rhinitis: Secondary | ICD-10-CM

## 2023-01-16 DIAGNOSIS — J4550 Severe persistent asthma, uncomplicated: Secondary | ICD-10-CM

## 2023-01-16 DIAGNOSIS — J302 Other seasonal allergic rhinitis: Secondary | ICD-10-CM

## 2023-01-16 DIAGNOSIS — T7800XD Anaphylactic reaction due to unspecified food, subsequent encounter: Secondary | ICD-10-CM

## 2023-01-16 DIAGNOSIS — R339 Retention of urine, unspecified: Secondary | ICD-10-CM | POA: Diagnosis not present

## 2023-01-16 DIAGNOSIS — H1013 Acute atopic conjunctivitis, bilateral: Secondary | ICD-10-CM

## 2023-01-16 DIAGNOSIS — T7800XA Anaphylactic reaction due to unspecified food, initial encounter: Secondary | ICD-10-CM

## 2023-01-16 MED ORDER — LEVALBUTEROL HCL 1.25 MG/3ML IN NEBU: INHALATION_SOLUTION | RESPIRATORY_TRACT | 1 refills | Status: AC

## 2023-01-16 MED ORDER — QVAR REDIHALER 80 MCG/ACT IN AERB: INHALATION_SPRAY | RESPIRATORY_TRACT | 5 refills | Status: AC

## 2023-01-16 MED ORDER — AZELASTINE HCL 0.1 % NA SOLN: NASAL | 5 refills | Status: AC

## 2023-01-16 MED ORDER — EPINEPHRINE 0.3 MG/0.3ML IJ SOAJ: INTRAMUSCULAR | 1 refills | Status: AC

## 2023-01-16 MED ORDER — LEVALBUTEROL TARTRATE 45 MCG/ACT IN AERO: INHALATION_SPRAY | RESPIRATORY_TRACT | 1 refills | Status: AC

## 2023-01-16 NOTE — Patient Instructions (Addendum)
Asthma  Control needs to improve at this time Use Qvar 80 mcg-2 puffs three times a day for total of 6 puffs per day If having symptoms and using Levalbuterol 2 puffs then follow it up with Qvar 2 puffs.  This is the equivalent of using Airsupra (new rescue inhaler with albuterol + budesonide steroid) however you have had side effects with albuterol use thus you have Levalbuterol.  Continue levalbuterol 2 puffs once every 4 hours as needed for cough or wheeze You may use levalbuterol 2 puffs 5 to 15 minutes before activity to decrease cough or wheeze Continue Tezspire injections once every 28 days for control of asthma. Previously been on [place] and has had better control on Tezspire.  Asthma control goals:  Full participation in all desired activities (may need albuterol before activity) Albuterol use two time or less a week on average (not counting use with activity) Cough interfering with sleep two time or less a month Oral steroids no more than once a year No hospitalizations   Allergic rhinitis This continue allergen avoidance measures directed toward grass pollen, weed pollen, tree pollen, mold, dust mite, mouse, and feather as listed below Alternate every couple months between Zyrtec and Allegra 1 tab daily.  Use Astepro 2 sprays each nostril twice a day for runny nose control Use Flonase 2 sprays each nostril daily for stuffy nose/nasal congestion control when needed  Allergic conjunctivitis Some over the counter eye drops include Pataday one drop in each eye once a day as needed for red, itchy eyes OR Zaditor one drop in each eye twice a day as needed for red itchy eyes.    Food allergy Continue to avoid tree nuts.  In case of an allergic reaction, take Benadryl 50 mg  every 4 hours, and if life-threatening symptoms occur, inject with EpiPen 0.3 mg.  Call the clinic if this treatment plan is not working well for you.  Follow up in 3-4 months or sooner if needed.

## 2023-01-16 NOTE — Progress Notes (Signed)
Follow-up Note  RE: James Christian Koon MRN: [id number] DOB: 09/08/1995 Date of Office Visit: 01/16/2023   History of present illness: James Matthews is a 27 y.o. male presenting today for follow-up of asthma, allergic rhinitis with conjunctivitis and food allergy.  He was last seen in the office on 10/15/2022 by our nurse practitioner Amada Jupiter.  He feels like the heat is impacting his asthma control.  About 4 times a week he will note symptoms of cough, shortness of breath and these since it has been hot for the past 2 weeks.  He has had daytime and nighttime symptoms.  He is needing to use his rescue inhaler a bit more.  He is using his Qvar 2 puffs twice a day at this time.  He also continues on Tezspire monthly injections.  He does feel that the Dorothea Ogle has been the most effective biologic asthma agent thus far that he has been on.  States his allergy symptoms has been acting up as well noting more Itchy/runny nose, itchy throat.  He is now back on zyrtec for past 2 months.  He has been using Flonase however he feels like it is not helping and may actually be worsening his nasal symptoms.  He continues to avoid tree nuts in his diet and has access to an epinephrine device.  Review of systems: Review of Systems  Constitutional: Negative.   HENT:         See HPI  Eyes: Negative.   Respiratory:         See HPI  Cardiovascular: Negative.   Musculoskeletal: Negative.   Skin: Negative.   Allergic/Immunologic: Negative.   Neurological: Negative.      All other systems negative unless noted above in HPI  Past medical/social/surgical/family history have been reviewed and are unchanged unless specifically indicated below.  No changes  Medication List: Current Outpatient Medications  Medication Sig Dispense Refill   ALPRAZolam (XANAX) 1 MG tablet Take 1 mg by mouth 2 (two) times daily as needed for anxiety.      azelastine (ASTELIN) 0.1 % nasal spray 2 sprays each nostril  twice a day for runny nose control 30 mL 5   busPIRone (BUSPAR) 30 MG tablet TAKE 1 TABLET BY MOUTH TWICE A DAY 180 tablet 1   desvenlafaxine (PRISTIQ) 100 MG 24 hr tablet Take 200 mg by mouth daily.     Erenumab-aooe (AIMOVIG) 140 MG/ML SOAJ Inject 140 mg into the skin every 30 (thirty) days. 1 mL 11   lisdexamfetamine (VYVANSE) 40 MG capsule Take 1 capsule (40 mg total) by mouth daily. 30 capsule 0   losartan (COZAAR) 50 MG tablet Take 1.5 tablets (75 mg total) by mouth daily. 45 tablet 5   ondansetron (ZOFRAN-ODT) 8 MG disintegrating tablet Take 1 tablet (8 mg total) by mouth every 8 (eight) hours as needed for nausea or vomiting. 20 tablet 3   Tezepelumab-ekko (TEZSPIRE) 210 MG/1. SOAJ Inject 210 mg into the skin every 28 (twenty-eight) days. 1.91 mL 11   tretinoin (RETIN-A) 0.05 % cream Apply 0.05 % topically.     zolpidem (AMBIEN) 10 MG tablet Take 10 mg by mouth at bedtime as needed.     EPINEPHrine 0.3 mg/0.3 mL IJ SOAJ injection INJECT 0.3 MLS INTO THE MUSCLE ONCE FOR 1 DOSE. AS DIRECTED FOR LIFE-THREATENING ALLERGIC REACTIONS 1 each 1   levalbuterol (XOPENEX HFA) 45 MCG/ACT inhaler INHALE 2 PUFFS INTO THE LUNGS EVERY 4 HOURS AS NEEDED FOR WHEEZE 15 g 1  levalbuterol (XOPENEX) 1.25 MG/3ML nebulizer solution 1 vial via nebulizer every 4-6 hours as needed. 75 mL 1   QVAR REDIHALER 80 MCG/ACT inhaler Use 2 puffs three times a day. 10.6 g 5   Current Facility-Administered Medications  Medication Dose Route Frequency Provider Last Rate Last Admin   tezepelumab-ekko (TEZSPIRE) 210 MG/1. syringe 210 mg  210 mg Subcutaneous Q28 days Marcelyn Bruins, MD   210 mg at 01/01/23 1447     Known medication allergies: Allergies  Allergen Reactions   Food Anaphylaxis    Tree Nuts    Albuterol     High HR   Chocolate     Face swelling/sounds like angioedema "I had to go to urgent care today due to an allergic reaction to a chocolate candy bar. My throat swelled up and was  closing, my nasal passages were congested, and my face was hurting. They gave me a steroid shot at the clinic, and then they sent me home presidone to take for a week" 06/2019   Lisinopril Other (See Comments)   Other Other (See Comments)    Pain med? Unsure of medication, caused n/v     Physical examination: Blood pressure 130/80, pulse (!) 101, temperature 98.7 F (37.1 C), temperature source Temporal, resp. rate 18, SpO2 97 %.  General: Alert, interactive, in no acute distress. HEENT: PERRLA, TMs pearly gray, turbinates non-edematous without discharge, post-pharynx non erythematous. Neck: Supple without lymphadenopathy. Lungs: Clear to auscultation without wheezing, rhonchi or rales. {no increased work of breathing. CV: Normal S1, S2 without murmurs. Abdomen: Nondistended, nontender. Skin: Warm and dry, without lesions or rashes. Extremities:  No clubbing, cyanosis or edema. Neuro:   Grossly intact.  Diagnositics/Labs: None today  Assessment and plan:   Asthma  Control needs to improve at this time Use Qvar 80 mcg-2 puffs three times a day for total of 6 puffs per day If having symptoms and using Levalbuterol 2 puffs then follow it up with Qvar 2 puffs.  This is the equivalent of using Airsupra (new rescue inhaler with albuterol + budesonide steroid) however you have had side effects with albuterol use thus you have Levalbuterol.  Continue levalbuterol 2 puffs once every 4 hours as needed for cough or wheeze You may use levalbuterol 2 puffs 5 to 15 minutes before activity to decrease cough or wheeze Continue Tezspire injections once every 28 days for control of asthma. Previously been on [place] and has had better control on Tezspire.  Asthma control goals:  Full participation in all desired activities (may need albuterol before activity) Albuterol use two time or less a week on average (not counting use with activity) Cough interfering with sleep two time or less a  month Oral steroids no more than once a year No hospitalizations   Allergic rhinitis This continue allergen avoidance measures directed toward grass pollen, weed pollen, tree pollen, mold, dust mite, mouse, and feather as listed below Alternate every couple months between Zyrtec and Allegra 1 tab daily.  Use Astepro 2 sprays each nostril twice a day for runny nose control Use Flonase 2 sprays each nostril daily for stuffy nose/nasal congestion control when needed  Allergic conjunctivitis Some over the counter eye drops include Pataday one drop in each eye once a day as needed for red, itchy eyes OR Zaditor one drop in each eye twice a day as needed for red itchy eyes.    Food allergy Continue to avoid tree nuts.  In case of an allergic  reaction, take Benadryl 50 mg  every 4 hours, and if life-threatening symptoms occur, inject with EpiPen 0.3 mg.  Call the clinic if this treatment plan is not working well for you.  Follow up in 3-4 months or sooner if needed.  I appreciate the opportunity to take part in Treveon's care. Please do not hesitate to contact me with questions.  Sincerely,   Margo Aye, MD Allergy/Immunology [facility]

## 2023-01-22 DIAGNOSIS — F908 Attention-deficit hyperactivity disorder, other type: Secondary | ICD-10-CM | POA: Diagnosis not present

## 2023-01-22 DIAGNOSIS — F411 Generalized anxiety disorder: Secondary | ICD-10-CM | POA: Diagnosis not present

## 2023-01-22 DIAGNOSIS — F4312 Post-traumatic stress disorder, chronic: Secondary | ICD-10-CM | POA: Diagnosis not present

## 2023-01-24 DIAGNOSIS — N320 Bladder-neck obstruction: Secondary | ICD-10-CM | POA: Diagnosis not present

## 2023-01-24 DIAGNOSIS — R3982 Chronic bladder pain: Secondary | ICD-10-CM | POA: Diagnosis not present

## 2023-01-24 DIAGNOSIS — N319 Neuromuscular dysfunction of bladder, unspecified: Secondary | ICD-10-CM | POA: Diagnosis not present

## 2023-01-24 DIAGNOSIS — Q642 Congenital posterior urethral valves: Secondary | ICD-10-CM | POA: Diagnosis not present

## 2023-01-29 ENCOUNTER — Ambulatory Visit (INDEPENDENT_AMBULATORY_CARE_PROVIDER_SITE_OTHER): Payer: BC Managed Care – PPO | Admitting: *Deleted

## 2023-01-29 DIAGNOSIS — F908 Attention-deficit hyperactivity disorder, other type: Secondary | ICD-10-CM | POA: Diagnosis not present

## 2023-01-29 DIAGNOSIS — F4312 Post-traumatic stress disorder, chronic: Secondary | ICD-10-CM | POA: Diagnosis not present

## 2023-01-29 DIAGNOSIS — F411 Generalized anxiety disorder: Secondary | ICD-10-CM | POA: Diagnosis not present

## 2023-01-29 DIAGNOSIS — J4550 Severe persistent asthma, uncomplicated: Secondary | ICD-10-CM

## 2023-01-31 DIAGNOSIS — F902 Attention-deficit hyperactivity disorder, combined type: Secondary | ICD-10-CM | POA: Diagnosis not present

## 2023-01-31 DIAGNOSIS — Z79899 Other long term (current) drug therapy: Secondary | ICD-10-CM | POA: Diagnosis not present

## 2023-02-05 DIAGNOSIS — F411 Generalized anxiety disorder: Secondary | ICD-10-CM | POA: Diagnosis not present

## 2023-02-05 DIAGNOSIS — F908 Attention-deficit hyperactivity disorder, other type: Secondary | ICD-10-CM | POA: Diagnosis not present

## 2023-02-05 DIAGNOSIS — F4312 Post-traumatic stress disorder, chronic: Secondary | ICD-10-CM | POA: Diagnosis not present

## 2023-02-11 DIAGNOSIS — F411 Generalized anxiety disorder: Secondary | ICD-10-CM | POA: Diagnosis not present

## 2023-02-11 DIAGNOSIS — F330 Major depressive disorder, recurrent, mild: Secondary | ICD-10-CM | POA: Diagnosis not present

## 2023-02-11 DIAGNOSIS — G4700 Insomnia, unspecified: Secondary | ICD-10-CM | POA: Diagnosis not present

## 2023-02-12 DIAGNOSIS — F908 Attention-deficit hyperactivity disorder, other type: Secondary | ICD-10-CM | POA: Diagnosis not present

## 2023-02-12 DIAGNOSIS — F411 Generalized anxiety disorder: Secondary | ICD-10-CM | POA: Diagnosis not present

## 2023-02-12 DIAGNOSIS — F4312 Post-traumatic stress disorder, chronic: Secondary | ICD-10-CM | POA: Diagnosis not present

## 2023-02-19 DIAGNOSIS — F908 Attention-deficit hyperactivity disorder, other type: Secondary | ICD-10-CM | POA: Diagnosis not present

## 2023-02-19 DIAGNOSIS — F411 Generalized anxiety disorder: Secondary | ICD-10-CM | POA: Diagnosis not present

## 2023-02-19 DIAGNOSIS — F4312 Post-traumatic stress disorder, chronic: Secondary | ICD-10-CM | POA: Diagnosis not present

## 2023-02-25 DIAGNOSIS — N3010 Interstitial cystitis (chronic) without hematuria: Secondary | ICD-10-CM | POA: Diagnosis not present

## 2023-02-25 DIAGNOSIS — N398 Other specified disorders of urinary system: Secondary | ICD-10-CM | POA: Diagnosis not present

## 2023-02-25 DIAGNOSIS — R3989 Other symptoms and signs involving the genitourinary system: Secondary | ICD-10-CM | POA: Diagnosis not present

## 2023-02-25 DIAGNOSIS — K581 Irritable bowel syndrome with constipation: Secondary | ICD-10-CM | POA: Diagnosis not present

## 2023-02-26 ENCOUNTER — Ambulatory Visit (INDEPENDENT_AMBULATORY_CARE_PROVIDER_SITE_OTHER): Payer: BC Managed Care – PPO | Admitting: *Deleted

## 2023-02-26 DIAGNOSIS — F908 Attention-deficit hyperactivity disorder, other type: Secondary | ICD-10-CM | POA: Diagnosis not present

## 2023-02-26 DIAGNOSIS — J4550 Severe persistent asthma, uncomplicated: Secondary | ICD-10-CM

## 2023-02-26 DIAGNOSIS — F4312 Post-traumatic stress disorder, chronic: Secondary | ICD-10-CM | POA: Diagnosis not present

## 2023-02-26 DIAGNOSIS — F411 Generalized anxiety disorder: Secondary | ICD-10-CM | POA: Diagnosis not present

## 2023-03-05 DIAGNOSIS — F908 Attention-deficit hyperactivity disorder, other type: Secondary | ICD-10-CM | POA: Diagnosis not present

## 2023-03-05 DIAGNOSIS — F411 Generalized anxiety disorder: Secondary | ICD-10-CM | POA: Diagnosis not present

## 2023-03-05 DIAGNOSIS — F4312 Post-traumatic stress disorder, chronic: Secondary | ICD-10-CM | POA: Diagnosis not present

## 2023-03-12 DIAGNOSIS — F4312 Post-traumatic stress disorder, chronic: Secondary | ICD-10-CM | POA: Diagnosis not present

## 2023-03-12 DIAGNOSIS — F908 Attention-deficit hyperactivity disorder, other type: Secondary | ICD-10-CM | POA: Diagnosis not present

## 2023-03-12 DIAGNOSIS — F411 Generalized anxiety disorder: Secondary | ICD-10-CM | POA: Diagnosis not present

## 2023-03-25 NOTE — Progress Notes (Deleted)
NEUROLOGY FOLLOW UP OFFICE NOTE  James Matthews [id number]  Assessment/Plan:   Migraine without aura, without status migrainosus, not intractable   Migraine prevention:  Aimovig 140mg  every 28 days *** Migraine rescue:  Tylenol, Zofran *** Limit use of pain relievers to no more than 2 days out of week to prevent risk of rebound or medication-overuse headache. Keep headache diary Follow up 1 year ***   Subjective:  James Matthews is a 27 year old male with HTN, CKD, ADHD and asthma who follows up for migraines.Marland Kitchen  He is accompanied by his mother who supplements history.   UPDATE: Migraines are stable. *** Intensity:  Moderate Duration:  1 hour with acetaminophen Frequency:  2 to 3 a month     Current NSAIDS:  none Current analgesics:  Tylenol  Current triptans: none Current ergotamine:  none Current anti-emetic:  Zofran ODT 8mg  Current muscle relaxants:  none Current anti-anxiolytic:  busipirone Current sleep aide:  none Current Antihypertensive medications:  none Current Antidepressant medications:  Pristiq, Vyvanse Current Anticonvulsant medications:  none Current anti-CGRP:  Aimovig 140mg  Current Vitamins/Herbal/Supplements:  none Current Antihistamines/Decongestants:  Astelin Other therapy:  none Hormone/birth control:  none   Caffeine:  1 cup coffee daily. Diet:  2 liters water daily.  No soda. Exercise:  Once a week Depression:  Yes but stable; Anxiety:  Yes but stable Other pain:  no Sleep hygiene:  8 hours of sleep a night.  Previously poor   HISTORY:  He has history of migraines since childhood but have gotten worse about 2 months ago.  He has no explanation for increased frequency.  They are severe right worse than left parietal pressure-like headaches. They are associated with nausea, visual disturbance (red dots) photophobia and phonophobia.  They last from several hours to several days.  They have been occurring daily (previously occurring 3  to 4 times a week).  Triggers include light and sound.  Due to his speech disorder, prolonged talking may be a trigger.  Laying down to rest in dark and quiet room helps.  Prednisone taper was ineffective after it was finished.  MRI of brain with and without contrast on 08/06/2019 was normal.   Remote CT head without contrast from 02/19/2007 to evaluate headache was personally reviewed and demonstrated incidental findings consistent with right ethmoid sinusitis but no acute intracranial abnormality.   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  No known cause but possibly genetic.  He was born 3 months premature.  Patient has history of syncope and orthostatic hypotension.  There is associated confusion and blurred vision.  Also reports profuse sweating.  Evaluated by cardiology.  Echo and 14 day event monitor unremarkable.  his nephrologist took him off losartan and symptoms resolved.   Past NSAIDS/steroids:  Prednisone. Unable to take NSAIDs due to renal disorder. Past analgesics:  none Past abortive triptans:  sumatriptan (ineffective), rizatriptan Past abortive ergotamine:  none Past muscle relaxants:  none Past anti-emetic:  Zofran ODT 4mg  Past antihypertensive medications:  lisinopril Past antidepressant/antipsychotic/mood medications:  Fluoxetine, mirtazepine, Seroquel, trazodone Past anticonvulsant medications:  Gabapentin 600mg  Past anti-CGRP:  nonw Past vitamins/Herbal/Supplements:  none Past antihistamines/decongestants:  none Other past therapies:  none     Family history of headache:  Mom (migraines); brother (migraines); sister (migraines)  PAST MEDICAL HISTORY: Past Medical History:  Diagnosis Date   Asthma    Hypertension    IBS (irritable bowel syndrome)    probiotic   Orthostatic hypertension  Posterior urethral valves    Premature birth    3 months early   Renal disorder    Verbal apraxia     MEDICATIONS: Current Outpatient Medications on File Prior to  Visit  Medication Sig Dispense Refill   ALPRAZolam (XANAX) 1 MG tablet Take 1 mg by mouth 2 (two) times daily as needed for anxiety.      azelastine (ASTELIN) 0.1 % nasal spray 2 sprays each nostril twice a day for runny nose control 30 mL 5   busPIRone (BUSPAR) 30 MG tablet TAKE 1 TABLET BY MOUTH TWICE A DAY 180 tablet 1   desvenlafaxine (PRISTIQ) 100 MG 24 hr tablet Take 200 mg by mouth daily.     EPINEPHrine 0.3 mg/0.3 mL IJ SOAJ injection INJECT 0.3 MLS INTO THE MUSCLE ONCE FOR 1 DOSE. AS DIRECTED FOR LIFE-THREATENING ALLERGIC REACTIONS 1 each 1   Erenumab-aooe (AIMOVIG) 140 MG/ML SOAJ Inject 140 mg into the skin every 30 (thirty) days. 1 mL 11   levalbuterol (XOPENEX HFA) 45 MCG/ACT inhaler INHALE 2 PUFFS INTO THE LUNGS EVERY 4 HOURS AS NEEDED FOR WHEEZE 15 g 1   levalbuterol (XOPENEX) 1.25 MG/3ML nebulizer solution 1 vial via nebulizer every 4-6 hours as needed. 75 mL 1   lisdexamfetamine (VYVANSE) 40 MG capsule Take 1 capsule (40 mg total) by mouth daily. 30 capsule 0   losartan (COZAAR) 50 MG tablet Take 1.5 tablets (75 mg total) by mouth daily. 45 tablet 5   ondansetron (ZOFRAN-ODT) 8 MG disintegrating tablet Take 1 tablet (8 mg total) by mouth every 8 (eight) hours as needed for nausea or vomiting. 20 tablet 3   QVAR REDIHALER 80 MCG/ACT inhaler Use 2 puffs three times a day. 10.6 g 5   Tezepelumab-ekko (TEZSPIRE) 210 MG/1. SOAJ Inject 210 mg into the skin every 28 (twenty-eight) days. 1.91 mL 11   tretinoin (RETIN-A) 0.05 % cream Apply 0.05 % topically.     zolpidem (AMBIEN) 10 MG tablet Take 10 mg by mouth at bedtime as needed.     Current Facility-Administered Medications on File Prior to Visit  Medication Dose Route Frequency Provider Last Rate Last Admin   tezepelumab-ekko (TEZSPIRE) 210 MG/1. syringe 210 mg  210 mg Subcutaneous Q28 days Marcelyn Bruins, MD   210 mg at 02/26/23 1434    ALLERGIES: Allergies  Allergen Reactions   Food Anaphylaxis    Tree Nuts     Albuterol     High HR   Chocolate     Face swelling/sounds like angioedema "I had to go to urgent care today due to an allergic reaction to a chocolate candy bar. My throat swelled up and was closing, my nasal passages were congested, and my face was hurting. They gave me a steroid shot at the clinic, and then they sent me home presidone to take for a week" 06/2019   Lisinopril Other (See Comments)   Other Other (See Comments)    Pain med? Unsure of medication, caused n/v    FAMILY HISTORY: Family History  Problem Relation Age of Onset   Breast cancer Mother        survivor   Hypertension Mother    Hyperlipidemia Mother    Other Father        pituitary tumor- gamma knife radiation   Heart disease Maternal Grandfather    Prostate cancer Maternal Grandfather        17s   Melanoma Sister    Prostate cancer Paternal Grandfather  50s   Ovarian cancer Maternal Aunt        mets to liver       Objective:  *** General: No acute distress.  Patient appears well-groomed.   Head:  Normocephalic/atraumatic Eyes:  Fundi examined but not visualized Neck: supple, no paraspinal tenderness, full range of motion Heart:  Regular rate and rhythm Neurological Exam: alert and oriented.  Speech fluent and not dysarthric, language intact.  CN II-XII intact. Bulk and tone normal, muscle strength 5/5 throughout.  Sensation to light touch intact.  Deep tendon reflexes 2+ throughout.  Finger to nose testing intact.  Gait normal, Romberg negative.   Shon Millet, DO  CC: Tana Conch, MD

## 2023-03-26 ENCOUNTER — Ambulatory Visit: Payer: BC Managed Care – PPO

## 2023-03-26 ENCOUNTER — Ambulatory Visit: Payer: Self-pay | Admitting: Neurology

## 2023-03-26 ENCOUNTER — Encounter: Payer: Self-pay | Admitting: Neurology

## 2023-03-26 DIAGNOSIS — Z029 Encounter for administrative examinations, unspecified: Secondary | ICD-10-CM

## 2023-04-01 ENCOUNTER — Ambulatory Visit: Payer: BC Managed Care – PPO | Admitting: Neurology

## 2023-04-03 ENCOUNTER — Other Ambulatory Visit: Payer: Self-pay | Admitting: Neurology

## 2023-04-16 DIAGNOSIS — N398 Other specified disorders of urinary system: Secondary | ICD-10-CM | POA: Diagnosis not present

## 2023-04-16 DIAGNOSIS — Z905 Acquired absence of kidney: Secondary | ICD-10-CM | POA: Diagnosis not present

## 2023-04-16 DIAGNOSIS — K219 Gastro-esophageal reflux disease without esophagitis: Secondary | ICD-10-CM | POA: Diagnosis not present

## 2023-04-16 DIAGNOSIS — J4550 Severe persistent asthma, uncomplicated: Secondary | ICD-10-CM | POA: Diagnosis not present

## 2023-04-16 DIAGNOSIS — N182 Chronic kidney disease, stage 2 (mild): Secondary | ICD-10-CM | POA: Diagnosis not present

## 2023-04-16 DIAGNOSIS — Z4542 Encounter for adjustment and management of neuropacemaker (brain) (peripheral nerve) (spinal cord): Secondary | ICD-10-CM | POA: Diagnosis not present

## 2023-04-16 DIAGNOSIS — I129 Hypertensive chronic kidney disease with stage 1 through stage 4 chronic kidney disease, or unspecified chronic kidney disease: Secondary | ICD-10-CM | POA: Diagnosis not present

## 2023-04-16 DIAGNOSIS — R350 Frequency of micturition: Secondary | ICD-10-CM | POA: Diagnosis not present

## 2023-04-16 DIAGNOSIS — Z79899 Other long term (current) drug therapy: Secondary | ICD-10-CM | POA: Diagnosis not present

## 2023-04-16 DIAGNOSIS — R339 Retention of urine, unspecified: Secondary | ICD-10-CM | POA: Diagnosis not present

## 2023-04-16 DIAGNOSIS — N319 Neuromuscular dysfunction of bladder, unspecified: Secondary | ICD-10-CM | POA: Diagnosis not present

## 2023-04-16 DIAGNOSIS — R3915 Urgency of urination: Secondary | ICD-10-CM | POA: Diagnosis not present

## 2023-04-22 DIAGNOSIS — N319 Neuromuscular dysfunction of bladder, unspecified: Secondary | ICD-10-CM | POA: Diagnosis not present

## 2023-04-22 DIAGNOSIS — Z9889 Other specified postprocedural states: Secondary | ICD-10-CM | POA: Diagnosis not present

## 2023-04-30 DIAGNOSIS — N182 Chronic kidney disease, stage 2 (mild): Secondary | ICD-10-CM | POA: Diagnosis not present

## 2023-04-30 DIAGNOSIS — R3915 Urgency of urination: Secondary | ICD-10-CM | POA: Diagnosis not present

## 2023-04-30 DIAGNOSIS — I129 Hypertensive chronic kidney disease with stage 1 through stage 4 chronic kidney disease, or unspecified chronic kidney disease: Secondary | ICD-10-CM | POA: Diagnosis not present

## 2023-04-30 DIAGNOSIS — Z905 Acquired absence of kidney: Secondary | ICD-10-CM | POA: Diagnosis not present

## 2023-04-30 DIAGNOSIS — N319 Neuromuscular dysfunction of bladder, unspecified: Secondary | ICD-10-CM | POA: Diagnosis not present

## 2023-04-30 DIAGNOSIS — N398 Other specified disorders of urinary system: Secondary | ICD-10-CM | POA: Diagnosis not present

## 2023-04-30 DIAGNOSIS — R350 Frequency of micturition: Secondary | ICD-10-CM | POA: Diagnosis not present

## 2023-04-30 DIAGNOSIS — J4550 Severe persistent asthma, uncomplicated: Secondary | ICD-10-CM | POA: Diagnosis not present

## 2023-04-30 DIAGNOSIS — Z79899 Other long term (current) drug therapy: Secondary | ICD-10-CM | POA: Diagnosis not present

## 2023-04-30 DIAGNOSIS — K219 Gastro-esophageal reflux disease without esophagitis: Secondary | ICD-10-CM | POA: Diagnosis not present

## 2023-05-14 DIAGNOSIS — Z09 Encounter for follow-up examination after completed treatment for conditions other than malignant neoplasm: Secondary | ICD-10-CM | POA: Diagnosis not present

## 2023-05-14 DIAGNOSIS — N319 Neuromuscular dysfunction of bladder, unspecified: Secondary | ICD-10-CM | POA: Diagnosis not present

## 2023-05-16 DIAGNOSIS — F4310 Post-traumatic stress disorder, unspecified: Secondary | ICD-10-CM | POA: Diagnosis not present

## 2023-05-20 DIAGNOSIS — N319 Neuromuscular dysfunction of bladder, unspecified: Secondary | ICD-10-CM | POA: Diagnosis not present

## 2023-05-20 DIAGNOSIS — N398 Other specified disorders of urinary system: Secondary | ICD-10-CM | POA: Diagnosis not present

## 2023-05-20 DIAGNOSIS — N3010 Interstitial cystitis (chronic) without hematuria: Secondary | ICD-10-CM | POA: Diagnosis not present

## 2023-05-22 ENCOUNTER — Ambulatory Visit: Payer: Self-pay | Admitting: Allergy

## 2023-05-22 DIAGNOSIS — J309 Allergic rhinitis, unspecified: Secondary | ICD-10-CM

## 2023-05-24 ENCOUNTER — Ambulatory Visit
Admission: EM | Admit: 2023-05-24 | Discharge: 2023-05-24 | Disposition: A | Discharge status: Home / Self Care | Payer: BC Managed Care – PPO

## 2023-05-24 DIAGNOSIS — B349 Viral infection, unspecified: Secondary | ICD-10-CM

## 2023-05-24 LAB — POC COVID19/FLU A&B COMBO
Covid Antigen, POC: NEGATIVE
Influenza A Antigen, POC: NEGATIVE
Influenza B Antigen, POC: NEGATIVE

## 2023-05-24 LAB — POCT RAPID STREP A (OFFICE): Rapid Strep A Screen: NEGATIVE

## 2023-05-24 MED ORDER — LIDOCAINE VISCOUS HCL 2 % MT SOLN: 15.0000 mL | OROMUCOSAL | 0 refills | Status: AC | PRN

## 2023-05-24 NOTE — ED Provider Notes (Signed)
[name]    CSN: [id number] Arrival date & time: 05/24/23  4098      History   Chief Complaint Chief Complaint  Patient presents with   Sore Throat   Cough   Nasal Congestion    HPI James Matthews is a 27 y.o. male.  Patient presents with 4 day history of congestion, runny nose, sore throat, mild cough.  No OTC medications taken.  No fever, rash, ear pain, ear drainage, shortness of breath, chest pain, vomiting, diarrhea, or other symptoms.  His medical history includes asthma, allergies, hypertension, CKD, right nephrectomy, IBS.  The history is provided by the patient and medical records.    Past Medical History:  Diagnosis Date   Asthma    Hypertension    IBS (irritable bowel syndrome)    probiotic   Orthostatic hypertension    Posterior urethral valves    Premature birth    3 months early   Renal disorder    Verbal apraxia     Patient Active Problem List   Diagnosis Date Noted   Seasonal and perennial allergic rhinitis 06/27/2022   Allergic conjunctivitis of both eyes 06/27/2022   Anaphylaxis due to food 06/27/2022   Orthostatic hypotension 09/02/2020   Syncope 05/27/2020   Palpitations 05/27/2020   PTSD (post-traumatic stress disorder) 10/19/2019   Daily headache 06/02/2019   Asthma, severe persistent 05/25/2019   Social anxiety disorder 05/04/2018   Insomnia 05/04/2018   Mesenteric lymphadenopathy 07/27/2016   Posterior urethral valves (PUV) 06/04/2016   GERD (gastroesophageal reflux disease) 06/04/2016   Essential hypertension 06/04/2016   Bladder spasms 06/04/2016   GAD (generalized anxiety disorder) 06/04/2016   CKD (chronic kidney disease), stage II 06/04/2016   Major depression, recurrent (HCC) 06/04/2016   Premature birth    IBS (irritable bowel syndrome)    Verbal apraxia     Past Surgical History:  Procedure Laterality Date   ADENOIDECTOMY     ESOPHAGEAL MANOMETRY N/A 01/23/2016   Procedure: ESOPHAGEAL MANOMETRY  (EM);  Surgeon: Charna Elizabeth, MD;  Location: [facility];  Service: Endoscopy;  Laterality: N/A;   LASIK     eye surgery july 2017   NEPHRECTOMY     rght    OTHER SURGICAL HISTORY     TONSILLECTOMY         Home Medications    Prior to Admission medications   Medication Sig Start Date End Date Taking? Authorizing Provider  lidocaine (XYLOCAINE) 2 % solution Use as directed 15 mLs in the mouth or throat as needed for mouth pain (Gargle and spit out.). 05/24/23  Yes Mickie Bail, NP  tamsulosin (FLOMAX) 0.4 MG CAPS capsule Take 1 tablet by mouth daily. 04/05/23  Yes [provider]  ALPRAZolam Prudy Feeler) 1 MG tablet Take 1 mg by mouth 2 (two) times daily as needed for anxiety.     [provider]  azelastine (ASTELIN) 0.1 % nasal spray 2 sprays each nostril twice a day for runny nose control 01/16/23   Marcelyn Bruins, MD  busPIRone (BUSPAR) 30 MG tablet TAKE 1 TABLET BY MOUTH TWICE A DAY 05/30/20   Shelva Majestic, MD  desvenlafaxine (PRISTIQ) 100 MG 24 hr tablet Take 200 mg by mouth daily. 10/30/20   [provider]  EPINEPHrine 0.3 mg/0.3 mL IJ SOAJ injection INJECT 0.3 MLS INTO THE MUSCLE ONCE FOR 1 DOSE. AS DIRECTED FOR LIFE-THREATENING ALLERGIC REACTIONS 01/16/23   Marcelyn Bruins, MD  Erenumab-aooe (AIMOVIG) 140 MG/ML SOAJ Inject  140 mg into the skin every 30 (thirty) days. 03/27/22   Everlena Cooper, Adam R, DO  levalbuterol (XOPENEX HFA) 45 MCG/ACT inhaler INHALE 2 PUFFS INTO THE LUNGS EVERY 4 HOURS AS NEEDED FOR WHEEZE 01/16/23   Marcelyn Bruins, MD  levalbuterol Pauline Aus) 1.25 MG/3ML nebulizer solution 1 vial via nebulizer every 4-6 hours as needed. 01/16/23   Marcelyn Bruins, MD  lisdexamfetamine (VYVANSE) 40 MG capsule Take 1 capsule (40 mg total) by mouth daily. 12/24/22     losartan (COZAAR) 50 MG tablet Take 1.5 tablets (75 mg total) by mouth daily. 12/15/20   Shelva Majestic, MD  ondansetron (ZOFRAN-ODT) 8 MG disintegrating  tablet Take 1 tablet (8 mg total) by mouth every 8 (eight) hours as needed for nausea or vomiting. 07/27/21   Everlena Cooper, Adam R, DO  QVAR REDIHALER 80 MCG/ACT inhaler Use 2 puffs three times a day. 01/16/23   Marcelyn Bruins, MD  Tezepelumab-ekko (TEZSPIRE) 210 MG/1. SOAJ Inject 210 mg into the skin every 28 (twenty-eight) days. 12/17/22   Nehemiah Settle, FNP  tretinoin (RETIN-A) 0.05 % cream Apply 0.05 % topically. 06/19/22   [provider]  zolpidem (AMBIEN) 10 MG tablet Take 10 mg by mouth at bedtime as needed. 09/26/22   [provider]    Family History Family History  Problem Relation Age of Onset   Breast cancer Mother        survivor   Hypertension Mother    Hyperlipidemia Mother    Other Father        pituitary tumor- gamma knife radiation   Heart disease Maternal Grandfather    Prostate cancer Maternal Grandfather        80s   Melanoma Sister    Prostate cancer Paternal Grandfather        4s   Ovarian cancer Maternal Aunt        mets to liver     Social History Social History   Tobacco Use   Smoking status: Never   Smokeless tobacco: Never  Substance Use Topics   Alcohol use: No   Drug use: No     Allergies   Food, Albuterol, Chocolate, Lisinopril, and Other   Review of Systems Review of Systems  Constitutional:  Negative for chills and fever.  HENT:  Positive for congestion, rhinorrhea and sore throat. Negative for ear discharge and ear pain.   Respiratory:  Positive for cough. Negative for shortness of breath.   Cardiovascular:  Negative for chest pain and palpitations.  Gastrointestinal:  Negative for diarrhea and vomiting.  Skin:  Negative for color change and rash.     Physical Exam Triage Vital Signs ED Triage Vitals  Encounter Vitals Group     BP 05/24/23 1057 131/82     Systolic BP Percentile --      Diastolic BP Percentile --      Pulse Rate 05/24/23 1057 (!) 110     Resp 05/24/23 1057 18     Temp 05/24/23  1057 98.1 F (36.7 C)     Temp src --      SpO2 05/24/23 1057 98 %     Weight --      Height --      Head Circumference --      Peak Flow --      Pain Score 05/24/23 1103 7     Pain Loc --      Pain Education --      Exclude from Growth Chart --  No data found.  Updated Vital Signs BP 131/82   Pulse (!) 110   Temp 98.1 F (36.7 C)   Resp 18   SpO2 98%   Visual Acuity Right Eye Distance:   Left Eye Distance:   Bilateral Distance:    Right Eye Near:   Left Eye Near:    Bilateral Near:     Physical Exam Constitutional:      General: He is not in acute distress. HENT:     Right Ear: Tympanic membrane normal.     Left Ear: Tympanic membrane normal.     Nose: Rhinorrhea present.     Mouth/Throat:     Mouth: Mucous membranes are moist.     Pharynx: Oropharynx is clear.  Cardiovascular:     Rate and Rhythm: Normal rate and regular rhythm.     Heart sounds: Normal heart sounds.  Pulmonary:     Effort: Pulmonary effort is normal. No respiratory distress.     Breath sounds: Normal breath sounds.  Skin:    General: Skin is warm and dry.  Neurological:     Mental Status: He is alert.      UC Treatments / Results  Labs (all labs ordered are listed, but only abnormal results are displayed) Labs Reviewed  POCT RAPID STREP A (OFFICE)  POC COVID19/FLU A&B COMBO    EKG   Radiology No results found.  Procedures Procedures (including critical care time)  Medications Ordered in UC Medications - No data to display  Initial Impression / Assessment and Plan / UC Course  I have reviewed the triage vital signs and the nursing notes.  Pertinent labs & imaging results that were available during my care of the patient were reviewed by me and considered in my medical decision making (see chart for details).    Viral illness.  Rapid strep negative.  Rapid COVID and flu negative.  Discussed symptomatic treatment including Tylenol as needed for fever or discomfort,  plain Mucinex as needed for congestion, rest, hydration.  Treating sore throat with viscous lidocaine.  Instructed patient to follow-up with PCP if not improving.  ED precautions given.  Patient agrees to plan of care.   Final Clinical Impressions(s) / UC Diagnoses   Final diagnoses:  Viral illness     Discharge Instructions      The strep, COVID, and flu tests are negative.    Use the viscous lidocaine as directed (gargle and spit out).  Take Tylenol as needed for fever or discomfort.  Take plain Mucinex as needed for congestion.  Rest and keep yourself hydrated.    Follow-up with your primary care provider if your symptoms are not improving.         ED Prescriptions     Medication Sig Dispense Auth. Provider   lidocaine (XYLOCAINE) 2 % solution Use as directed 15 mLs in the mouth or throat as needed for mouth pain (Gargle and spit out.). 100 mL Mickie Bail, NP      PDMP not reviewed this encounter.   Mickie Bail, NP 05/24/23 1155

## 2023-05-24 NOTE — ED Triage Notes (Signed)
Patient to Urgent Care with complaints of sore throat/ cough/ runny nose. Denies any known fevers.  Reports symptoms started four days ago. Denies any sick contacts.   OTC: no meds.

## 2023-05-24 NOTE — Discharge Instructions (Addendum)
The strep, COVID, and flu tests are negative.    Use the viscous lidocaine as directed (gargle and spit out).  Take Tylenol as needed for fever or discomfort.  Take plain Mucinex as needed for congestion.  Rest and keep yourself hydrated.    Follow-up with your primary care provider if your symptoms are not improving.

## 2023-05-28 DIAGNOSIS — F4310 Post-traumatic stress disorder, unspecified: Secondary | ICD-10-CM | POA: Diagnosis not present

## 2023-06-10 DIAGNOSIS — F411 Generalized anxiety disorder: Secondary | ICD-10-CM | POA: Diagnosis not present

## 2023-06-10 DIAGNOSIS — F330 Major depressive disorder, recurrent, mild: Secondary | ICD-10-CM | POA: Diagnosis not present

## 2023-06-10 DIAGNOSIS — G4700 Insomnia, unspecified: Secondary | ICD-10-CM | POA: Diagnosis not present

## 2023-06-18 DIAGNOSIS — Z905 Acquired absence of kidney: Secondary | ICD-10-CM | POA: Diagnosis not present

## 2023-06-18 DIAGNOSIS — N319 Neuromuscular dysfunction of bladder, unspecified: Secondary | ICD-10-CM | POA: Diagnosis not present

## 2023-06-18 DIAGNOSIS — N182 Chronic kidney disease, stage 2 (mild): Secondary | ICD-10-CM | POA: Diagnosis not present

## 2023-06-21 ENCOUNTER — Ambulatory Visit
Admission: EM | Admit: 2023-06-21 | Discharge: 2023-06-21 | Disposition: A | Discharge status: Home / Self Care | Payer: BC Managed Care – PPO

## 2023-06-21 DIAGNOSIS — H6691 Otitis media, unspecified, right ear: Secondary | ICD-10-CM | POA: Diagnosis not present

## 2023-06-21 DIAGNOSIS — J069 Acute upper respiratory infection, unspecified: Secondary | ICD-10-CM | POA: Diagnosis not present

## 2023-06-21 MED ORDER — AMOXICILLIN 875 MG PO TABS: 875.0000 mg | ORAL_TABLET | Freq: Two times a day (BID) | ORAL | 0 refills | Status: AC

## 2023-06-21 NOTE — ED Triage Notes (Signed)
Patient to Urgent Care with complaints of right sided ear pain/ sinus pressure/ cough/ sore throat/ nasal congestion. Denies any known fevers.  Reports symptoms started three days ago.  No otc meds.

## 2023-06-21 NOTE — Discharge Instructions (Addendum)
Take the amoxicillin as directed.  Follow up with your primary care provider if your symptoms are not improving.   ° ° °

## 2023-06-21 NOTE — ED Provider Notes (Signed)
James Matthews    CSN: [id number] Arrival date & time: 06/21/23  1118      History   Chief Complaint Chief Complaint  Patient presents with   Otalgia   Cough    HPI James Matthews is a 27 y.o. male.  Patient presents with 3-day history of right ear pain, congestion, sore throat, sinus pressure, cough.  No fever, ear drainage, shortness of breath.  No OTC medications taken.  Patient was seen here on 05/24/2023; diagnosed with viral illness; negative for strep COVID and flu; treated with viscous lidocaine.  The history is provided by the patient and medical records.    Past Medical History:  Diagnosis Date   Asthma    Hypertension    IBS (irritable bowel syndrome)    probiotic   Orthostatic hypertension    Posterior urethral valves    Premature birth    3 months early   Renal disorder    Verbal apraxia     Patient Active Problem List   Diagnosis Date Noted   Seasonal and perennial allergic rhinitis 06/27/2022   Allergic conjunctivitis of both eyes 06/27/2022   Anaphylaxis due to food 06/27/2022   Orthostatic hypotension 09/02/2020   Syncope 05/27/2020   Palpitations 05/27/2020   PTSD (post-traumatic stress disorder) 10/19/2019   Daily headache 06/02/2019   Asthma, severe persistent 05/25/2019   Social anxiety disorder 05/04/2018   Insomnia 05/04/2018   Mesenteric lymphadenopathy 07/27/2016   Posterior urethral valves (PUV) 06/04/2016   GERD (gastroesophageal reflux disease) 06/04/2016   Essential hypertension 06/04/2016   Bladder spasms 06/04/2016   GAD (generalized anxiety disorder) 06/04/2016   CKD (chronic kidney disease), stage II 06/04/2016   Major depression, recurrent (HCC) 06/04/2016   Premature birth    IBS (irritable bowel syndrome)    Verbal apraxia     Past Surgical History:  Procedure Laterality Date   ADENOIDECTOMY     ESOPHAGEAL MANOMETRY N/A 01/23/2016   Procedure: ESOPHAGEAL MANOMETRY (EM);  Surgeon: Charna Elizabeth, MD;   Location: [facility];  Service: Endoscopy;  Laterality: N/A;   LASIK     eye surgery july 2017   NEPHRECTOMY     rght    OTHER SURGICAL HISTORY     TONSILLECTOMY         Home Medications    Prior to Admission medications   Medication Sig Start Date End Date Taking? Authorizing Provider  amoxicillin (AMOXIL) 875 MG tablet Take 1 tablet (875 mg total) by mouth 2 (two) times daily for 10 days. 06/21/23 07/01/23 Yes Mickie Bail, NP  baclofen (LIORESAL) 10 MG tablet Take 10 mg by mouth daily. 06/06/23  Yes [provider]  emtricitabine-tenofovir (TRUVADA) 200-300 MG tablet Take 1 tablet by mouth daily. 05/24/23  Yes [provider]  ALPRAZolam Prudy Feeler) 1 MG tablet Take 1 mg by mouth 2 (two) times daily as needed for anxiety.     [provider]  azelastine (ASTELIN) 0.1 % nasal spray 2 sprays each nostril twice a day for runny nose control 01/16/23   Marcelyn Bruins, MD  busPIRone (BUSPAR) 30 MG tablet TAKE 1 TABLET BY MOUTH TWICE A DAY 05/30/20   Shelva Majestic, MD  desvenlafaxine (PRISTIQ) 100 MG 24 hr tablet Take 200 mg by mouth daily. 10/30/20   [provider]  EPINEPHrine 0.3 mg/0.3 mL IJ SOAJ injection INJECT 0.3 MLS INTO THE MUSCLE ONCE FOR 1 DOSE. AS DIRECTED FOR LIFE-THREATENING ALLERGIC REACTIONS 01/16/23   Marcelyn Bruins,  MD  Erenumab-aooe (AIMOVIG) 140 MG/ML SOAJ Inject 140 mg into the skin every 30 (thirty) days. 03/27/22   Everlena Cooper, Adam R, DO  levalbuterol (XOPENEX HFA) 45 MCG/ACT inhaler INHALE 2 PUFFS INTO THE LUNGS EVERY 4 HOURS AS NEEDED FOR WHEEZE 01/16/23   Marcelyn Bruins, MD  levalbuterol Pauline Aus) 1.25 MG/3ML nebulizer solution 1 vial via nebulizer every 4-6 hours as needed. 01/16/23   Marcelyn Bruins, MD  lidocaine (XYLOCAINE) 2 % solution Use as directed 15 mLs in the mouth or throat as needed for mouth pain (Gargle and spit out.). Patient not taking: Reported on 06/21/2023 05/24/23   Mickie Bail, NP  lisdexamfetamine (VYVANSE) 40 MG capsule Take 1 capsule (40 mg total) by mouth daily. 12/24/22     losartan (COZAAR) 50 MG tablet Take 1.5 tablets (75 mg total) by mouth daily. 12/15/20   Shelva Majestic, MD  ondansetron (ZOFRAN-ODT) 8 MG disintegrating tablet Take 1 tablet (8 mg total) by mouth every 8 (eight) hours as needed for nausea or vomiting. 07/27/21   Everlena Cooper, Adam R, DO  QVAR REDIHALER 80 MCG/ACT inhaler Use 2 puffs three times a day. 01/16/23   Marcelyn Bruins, MD  tamsulosin (FLOMAX) 0.4 MG CAPS capsule Take 1 tablet by mouth daily. 04/05/23   [provider]  Tezepelumab-ekko (TEZSPIRE) 210 MG/1. SOAJ Inject 210 mg into the skin every 28 (twenty-eight) days. 12/17/22   Nehemiah Settle, FNP  tretinoin (RETIN-A) 0.05 % cream Apply 0.05 % topically. 06/19/22   [provider]  zolpidem (AMBIEN) 10 MG tablet Take 10 mg by mouth at bedtime as needed. 09/26/22   [provider]    Family History Family History  Problem Relation Age of Onset   Breast cancer Mother        survivor   Hypertension Mother    Hyperlipidemia Mother    Other Father        pituitary tumor- gamma knife radiation   Heart disease Maternal Grandfather    Prostate cancer Maternal Grandfather        76s   Melanoma Sister    Prostate cancer Paternal Grandfather        9s   Ovarian cancer Maternal Aunt        mets to liver     Social History Social History   Tobacco Use   Smoking status: Never   Smokeless tobacco: Never  Substance Use Topics   Alcohol use: No   Drug use: No     Allergies   Food, Albuterol, Chocolate, Lisinopril, and Other   Review of Systems Review of Systems  Constitutional:  Negative for chills and fever.  HENT:  Positive for congestion, ear pain, postnasal drip, rhinorrhea, sinus pressure and sore throat.   Respiratory:  Positive for cough. Negative for shortness of breath.      Physical Exam Triage Vital Signs ED Triage  Vitals  Encounter Vitals Group     BP 06/21/23 1334 134/76     Systolic BP Percentile --      Diastolic BP Percentile --      Pulse Rate 06/21/23 1334 (!) 109     Resp 06/21/23 1334 18     Temp 06/21/23 1334 97.9 F (36.6 C)     Temp src --      SpO2 06/21/23 1334 99 %     Weight --      Height --      Head Circumference --  Peak Flow --      Pain Score 06/21/23 1339 6     Pain Loc --      Pain Education --      Exclude from Growth Chart --    No data found.  Updated Vital Signs BP 134/76   Pulse (!) 109   Temp 97.9 F (36.6 C)   Resp 18   SpO2 99%   Visual Acuity Right Eye Distance:   Left Eye Distance:   Bilateral Distance:    Right Eye Near:   Left Eye Near:    Bilateral Near:     Physical Exam Constitutional:      General: He is not in acute distress. HENT:     Right Ear: Tympanic membrane is erythematous.     Left Ear: Tympanic membrane normal.     Nose: Congestion present.     Mouth/Throat:     Mouth: Mucous membranes are moist.     Pharynx: Oropharynx is clear.  Cardiovascular:     Rate and Rhythm: Normal rate and regular rhythm.     Heart sounds: Normal heart sounds.  Pulmonary:     Effort: Pulmonary effort is normal. No respiratory distress.     Breath sounds: Normal breath sounds.  Skin:    General: Skin is warm and dry.  Neurological:     Mental Status: He is alert.      UC Treatments / Results  Labs (all labs ordered are listed, but only abnormal results are displayed) Labs Reviewed - No data to display  EKG   Radiology No results found.  Procedures Procedures (including critical care time)  Medications Ordered in UC Medications - No data to display  Initial Impression / Assessment and Plan / UC Course  I have reviewed the triage vital signs and the nursing notes.  Pertinent labs & imaging results that were available during my care of the patient were reviewed by me and considered in my medical decision making (see  chart for details).    Right otitis media, URI.  Treating with amoxicillin.  Tylenol as needed.  Instructed patient to follow-up with his PCP if he is not improving.  He agrees to plan of care.  Final Clinical Impressions(s) / UC Diagnoses   Final diagnoses:  Right otitis media, unspecified otitis media type  Acute URI     Discharge Instructions      Take the amoxicillin as directed.  Follow-up with your primary care provider if your symptoms are not improving.      ED Prescriptions     Medication Sig Dispense Auth. Provider   amoxicillin (AMOXIL) 875 MG tablet Take 1 tablet (875 mg total) by mouth 2 (two) times daily for 10 days. 20 tablet Mickie Bail, NP      PDMP not reviewed this encounter.   Mickie Bail, NP 06/21/23 [phone]

## 2023-08-01 DIAGNOSIS — F902 Attention-deficit hyperactivity disorder, combined type: Secondary | ICD-10-CM | POA: Diagnosis not present

## 2023-08-01 DIAGNOSIS — Z79899 Other long term (current) drug therapy: Secondary | ICD-10-CM | POA: Diagnosis not present

## 2023-08-08 DIAGNOSIS — R339 Retention of urine, unspecified: Secondary | ICD-10-CM | POA: Diagnosis not present

## 2023-09-01 ENCOUNTER — Ambulatory Visit
Admission: EM | Admit: 2023-09-01 | Discharge: 2023-09-01 | Disposition: A | Discharge status: Home / Self Care | Payer: BC Managed Care – PPO | Attending: Emergency Medicine | Admitting: Emergency Medicine

## 2023-09-01 DIAGNOSIS — J069 Acute upper respiratory infection, unspecified: Secondary | ICD-10-CM | POA: Diagnosis not present

## 2023-09-01 LAB — POCT RAPID STREP A (OFFICE): Rapid Strep A Screen: NEGATIVE

## 2023-09-01 LAB — POC COVID19/FLU A&B COMBO
Covid Antigen, POC: NEGATIVE
Influenza A Antigen, POC: NEGATIVE
Influenza B Antigen, POC: NEGATIVE

## 2023-09-01 NOTE — Discharge Instructions (Addendum)
 The strep, COVID and flu tests are negative.   Take Tylenol or ibuprofen as needed for fever or discomfort.  Take plain Mucinex as needed for congestion.  Rest and keep yourself hydrated.    Follow-up with your primary care provider if your symptoms are not improving.

## 2023-09-01 NOTE — ED Provider Notes (Signed)
 [facility] Cower    CSN: [id number] Arrival date & time: 09/01/23  1056      History   Chief Complaint Chief Complaint  Patient presents with   Cough   Sore Throat   Generalized Body Aches    HPI James Matthews is a 28 y.o. male.  Patient presents with 1.5-day history of bodyaches, sore throat, cough.  No fever, chills, shortness of breath, vomiting, diarrhea.  He has been treating his symptoms with Tylenol.  The history is provided by the patient and medical records.    Past Medical History:  Diagnosis Date   Asthma    Hypertension    IBS (irritable bowel syndrome)    probiotic   Orthostatic hypertension    Posterior urethral valves    Premature birth    3 months early   Renal disorder    Verbal apraxia     Patient Active Problem List   Diagnosis Date Noted   Seasonal and perennial allergic rhinitis 06/27/2022   Allergic conjunctivitis of both eyes 06/27/2022   Anaphylaxis due to food 06/27/2022   Orthostatic hypotension 09/02/2020   Syncope 05/27/2020   Palpitations 05/27/2020   PTSD (post-traumatic stress disorder) 10/19/2019   Daily headache 06/02/2019   Asthma, severe persistent 05/25/2019   Social anxiety disorder 05/04/2018   Insomnia 05/04/2018   Mesenteric lymphadenopathy 07/27/2016   Posterior urethral valves (PUV) 06/04/2016   GERD (gastroesophageal reflux disease) 06/04/2016   Essential hypertension 06/04/2016   Bladder spasms 06/04/2016   GAD (generalized anxiety disorder) 06/04/2016   CKD (chronic kidney disease), stage II 06/04/2016   Major depression, recurrent (HCC) 06/04/2016   Premature birth    IBS (irritable bowel syndrome)    Verbal apraxia     Past Surgical History:  Procedure Laterality Date   ADENOIDECTOMY     ESOPHAGEAL MANOMETRY N/A 01/23/2016   Procedure: ESOPHAGEAL MANOMETRY (EM);  Surgeon: Charna Elizabeth, MD;  Location: [facility];  Service: Endoscopy;  Laterality: N/A;   LASIK     eye surgery july 2017    NEPHRECTOMY     rght    OTHER SURGICAL HISTORY     TONSILLECTOMY         Home Medications    Prior to Admission medications   Medication Sig Start Date End Date Taking? Authorizing Provider  ALPRAZolam Prudy Feeler) 1 MG tablet Take 1 mg by mouth 2 (two) times daily as needed for anxiety.    Yes [provider]  busPIRone (BUSPAR) 30 MG tablet TAKE 1 TABLET BY MOUTH TWICE A DAY 05/30/20  Yes Shelva Majestic, MD  desvenlafaxine (PRISTIQ) 100 MG 24 hr tablet Take 200 mg by mouth daily. 10/30/20  Yes [provider]  emtricitabine-tenofovir (TRUVADA) 200-300 MG tablet Take 1 tablet by mouth daily. 05/24/23  Yes [provider]  lisdexamfetamine (VYVANSE) 40 MG capsule Take 1 capsule (40 mg total) by mouth daily. 12/24/22  Yes   losartan (COZAAR) 50 MG tablet Take 1.5 tablets (75 mg total) by mouth daily. 12/15/20  Yes Shelva Majestic, MD  azelastine (ASTELIN) 0.1 % nasal spray 2 sprays each nostril twice a day for runny nose control 01/16/23   Marcelyn Bruins, MD  baclofen (LIORESAL) 10 MG tablet Take 10 mg by mouth daily. 06/06/23   [provider]  EPINEPHrine 0.3 mg/0.3 mL IJ SOAJ injection INJECT 0.3 MLS INTO THE MUSCLE ONCE FOR 1 DOSE. AS DIRECTED FOR LIFE-THREATENING ALLERGIC REACTIONS 01/16/23   Marcelyn Bruins, MD  Erenumab-aooe (AIMOVIG) 140 MG/ML SOAJ Inject 140 mg into the skin every 30 (thirty) days. 03/27/22   Everlena Cooper, Adam R, DO  levalbuterol (XOPENEX HFA) 45 MCG/ACT inhaler INHALE 2 PUFFS INTO THE LUNGS EVERY 4 HOURS AS NEEDED FOR WHEEZE 01/16/23   Marcelyn Bruins, MD  levalbuterol Pauline Aus) 1.25 MG/3ML nebulizer solution 1 vial via nebulizer every 4-6 hours as needed. 01/16/23   Marcelyn Bruins, MD  lidocaine (XYLOCAINE) 2 % solution Use as directed 15 mLs in the mouth or throat as needed for mouth pain (Gargle and spit out.). Patient not taking: Reported on 06/21/2023 05/24/23   Mickie Bail, NP  ondansetron  (ZOFRAN-ODT) 8 MG disintegrating tablet Take 1 tablet (8 mg total) by mouth every 8 (eight) hours as needed for nausea or vomiting. 07/27/21   Everlena Cooper, Adam R, DO  QVAR REDIHALER 80 MCG/ACT inhaler Use 2 puffs three times a day. 01/16/23   Marcelyn Bruins, MD  tamsulosin (FLOMAX) 0.4 MG CAPS capsule Take 1 tablet by mouth daily. 04/05/23   [provider]  Tezepelumab-ekko (TEZSPIRE) 210 MG/1. SOAJ Inject 210 mg into the skin every 28 (twenty-eight) days. 12/17/22   Nehemiah Settle, FNP  tretinoin (RETIN-A) 0.05 % cream Apply 0.05 % topically. 06/19/22   [provider]  zolpidem (AMBIEN) 10 MG tablet Take 10 mg by mouth at bedtime as needed. 09/26/22   [provider]    Family History Family History  Problem Relation Age of Onset   Breast cancer Mother        survivor   Hypertension Mother    Hyperlipidemia Mother    Other Father        pituitary tumor- gamma knife radiation   Heart disease Maternal Grandfather    Prostate cancer Maternal Grandfather        15s   Melanoma Sister    Prostate cancer Paternal Grandfather        12s   Ovarian cancer Maternal Aunt        mets to liver     Social History Social History   Tobacco Use   Smoking status: Never   Smokeless tobacco: Never  Vaping Use   Vaping status: Never Used  Substance Use Topics   Alcohol use: No   Drug use: No     Allergies   Food, Albuterol, Chocolate, Lisinopril, and Other   Review of Systems Review of Systems  Constitutional:  Negative for chills and fever.  HENT:  Positive for sore throat. Negative for ear pain.   Respiratory:  Positive for cough. Negative for shortness of breath.   Gastrointestinal:  Negative for diarrhea and vomiting.     Physical Exam Triage Vital Signs ED Triage Vitals  Encounter Vitals Group     BP 09/01/23 1118 124/78     Systolic BP Percentile --      Diastolic BP Percentile --      Pulse Rate 09/01/23 1118 97     Resp 09/01/23 1118  18     Temp 09/01/23 1118 98 F (36.7 C)     Temp Source 09/01/23 1118 Oral     SpO2 09/01/23 1118 97 %     Weight --      Height --      Head Circumference --      Peak Flow --      Pain Score 09/01/23 1114 6     Pain Loc --      Pain Education --  Exclude from Growth Chart --    No data found.  Updated Vital Signs BP 124/78 (BP Location: Left Arm)   Pulse 97   Temp 98 F (36.7 C) (Oral)   Resp 18   SpO2 97%   Visual Acuity Right Eye Distance:   Left Eye Distance:   Bilateral Distance:    Right Eye Near:   Left Eye Near:    Bilateral Near:     Physical Exam Constitutional:      General: He is not in acute distress. HENT:     Right Ear: Tympanic membrane normal.     Left Ear: Tympanic membrane normal.     Nose: Nose normal.     Mouth/Throat:     Mouth: Mucous membranes are moist.     Pharynx: Oropharynx is clear.  Cardiovascular:     Rate and Rhythm: Normal rate and regular rhythm.     Heart sounds: Normal heart sounds.  Pulmonary:     Effort: Pulmonary effort is normal. No respiratory distress.     Breath sounds: Normal breath sounds.  Neurological:     Mental Status: He is alert.      UC Treatments / Results  Labs (all labs ordered are listed, but only abnormal results are displayed) Labs Reviewed  POCT RAPID STREP A (OFFICE)  POC COVID19/FLU A&B COMBO    EKG   Radiology No results found.  Procedures Procedures (including critical care time)  Medications Ordered in UC Medications - No data to display  Initial Impression / Assessment and Plan / UC Course  I have reviewed the triage vital signs and the nursing notes.  Pertinent labs & imaging results that were available during my care of the patient were reviewed by me and considered in my medical decision making (see chart for details).    Viral URI.  Rapid strep negative.  Rapid COVID and flu negative.  Discussed symptomatic treatment including Tylenol or ibuprofen as needed for  fever or discomfort, plain Mucinex as needed for congestion, rest, hydration.  Instructed patient to follow-up with PCP if not improving.  ED precautions given.  Patient agrees to plan of care.   Final Clinical Impressions(s) / UC Diagnoses   Final diagnoses:  Viral URI     Discharge Instructions      The strep, COVID and flu tests are negative.   Take Tylenol or ibuprofen as needed for fever or discomfort.  Take plain Mucinex as needed for congestion.  Rest and keep yourself hydrated.    Follow-up with your primary care provider if your symptoms are not improving.         ED Prescriptions   None    PDMP not reviewed this encounter.   Mickie Bail, NP 09/01/23 1149

## 2023-09-01 NOTE — ED Triage Notes (Signed)
 Sx 1.5 days  Sore throat Bodyaches Productive cough

## 2023-10-03 ENCOUNTER — Ambulatory Visit
Admission: EM | Admit: 2023-10-03 | Discharge: 2023-10-03 | Disposition: A | Discharge status: Home / Self Care | Attending: Emergency Medicine | Admitting: Emergency Medicine

## 2023-10-03 DIAGNOSIS — U071 COVID-19: Secondary | ICD-10-CM | POA: Diagnosis not present

## 2023-10-03 MED ORDER — ONDANSETRON 4 MG PO TBDP: 4.0000 mg | ORAL_TABLET | Freq: Three times a day (TID) | ORAL | 0 refills | Status: AC | PRN

## 2023-10-03 NOTE — ED Provider Notes (Signed)
 James Matthews    CSN: [id number] Arrival date & time: 10/03/23  1604      History   Chief Complaint Chief Complaint  Patient presents with   Covid Positive    HPI [name] is a 28 y.o. male.  Patient presents with bodyaches, cough, diarrhea x 2 days.  He tested positive for COVID at home yesterday.  No fever or shortness of breath.  He has nausea but no vomiting.  He has been treating his symptoms with Tylenol and Robitussin.  His medical history includes chronic kidney disease, right nephrectomy, asthma, IBS, hypertension.  The history is provided by the patient and medical records.    Past Medical History:  Diagnosis Date   Asthma    Hypertension    IBS (irritable bowel syndrome)    probiotic   Orthostatic hypertension    Posterior urethral valves    Premature birth    3 months early   Renal disorder    Verbal apraxia     Patient Active Problem List   Diagnosis Date Noted   Seasonal and perennial allergic rhinitis 06/27/2022   Allergic conjunctivitis of both eyes 06/27/2022   Anaphylaxis due to food 06/27/2022   Orthostatic hypotension 09/02/2020   Syncope 05/27/2020   Palpitations 05/27/2020   PTSD (post-traumatic stress disorder) 10/19/2019   Daily headache 06/02/2019   Asthma, severe persistent 05/25/2019   Social anxiety disorder 05/04/2018   Insomnia 05/04/2018   Mesenteric lymphadenopathy 07/27/2016   Posterior urethral valves (PUV) 06/04/2016   GERD (gastroesophageal reflux disease) 06/04/2016   Essential hypertension 06/04/2016   Bladder spasms 06/04/2016   GAD (generalized anxiety disorder) 06/04/2016   CKD (chronic kidney disease), stage II 06/04/2016   Major depression, recurrent (HCC) 06/04/2016   Premature birth    IBS (irritable bowel syndrome)    Verbal apraxia     Past Surgical History:  Procedure Laterality Date   ADENOIDECTOMY     ESOPHAGEAL MANOMETRY N/A 01/23/2016   Procedure: ESOPHAGEAL MANOMETRY (EM);   Surgeon: Charna Elizabeth, MD;  Location: [facility];  Service: Endoscopy;  Laterality: N/A;   LASIK     eye surgery july 2017   NEPHRECTOMY     rght    OTHER SURGICAL HISTORY     TONSILLECTOMY         Home Medications    Prior to Admission medications   Medication Sig Start Date End Date Taking? Authorizing Provider  ondansetron (ZOFRAN-ODT) 4 MG disintegrating tablet Take 1 tablet (4 mg total) by mouth every 8 (eight) hours as needed for nausea or vomiting. 10/03/23  Yes Mickie Bail, NP  ALPRAZolam Prudy Feeler) 1 MG tablet Take 1 mg by mouth 2 (two) times daily as needed for anxiety.     [provider]  azelastine (ASTELIN) 0.1 % nasal spray 2 sprays each nostril twice a day for runny nose control 01/16/23   Marcelyn Bruins, MD  baclofen (LIORESAL) 10 MG tablet Take 10 mg by mouth daily. Patient not taking: Reported on 10/03/2023 06/06/23   [provider]  busPIRone (BUSPAR) 30 MG tablet TAKE 1 TABLET BY MOUTH TWICE A DAY 05/30/20   Shelva Majestic, MD  desvenlafaxine (PRISTIQ) 100 MG 24 hr tablet Take 200 mg by mouth daily. 10/30/20   [provider]  emtricitabine-tenofovir (TRUVADA) 200-300 MG tablet Take 1 tablet by mouth daily. Patient not taking: Reported on 10/03/2023 05/24/23   [provider]  EPINEPHrine 0.3 mg/0.3 mL IJ SOAJ injection INJECT  0.3 MLS INTO THE MUSCLE ONCE FOR 1 DOSE. AS DIRECTED FOR LIFE-THREATENING ALLERGIC REACTIONS 01/16/23   Marcelyn Bruins, MD  Erenumab-aooe (AIMOVIG) 140 MG/ML SOAJ Inject 140 mg into the skin every 30 (thirty) days. Patient not taking: Reported on 10/03/2023 03/27/22   Drema Dallas, DO  levalbuterol [facility] HFA) 45 MCG/ACT inhaler INHALE 2 PUFFS INTO THE LUNGS EVERY 4 HOURS AS NEEDED FOR WHEEZE 01/16/23   Marcelyn Bruins, MD  levalbuterol Pauline Aus) 1.25 MG/3ML nebulizer solution 1 vial via nebulizer every 4-6 hours as needed. 01/16/23   Marcelyn Bruins, MD  lidocaine  (XYLOCAINE) 2 % solution Use as directed 15 mLs in the mouth or throat as needed for mouth pain (Gargle and spit out.). Patient not taking: Reported on 06/21/2023 05/24/23   Mickie Bail, NP  lisdexamfetamine (VYVANSE) 40 MG capsule Take 1 capsule (40 mg total) by mouth daily. 12/24/22     losartan (COZAAR) 50 MG tablet Take 1.5 tablets (75 mg total) by mouth daily. 12/15/20   Shelva Majestic, MD  ondansetron (ZOFRAN-ODT) 8 MG disintegrating tablet Take 1 tablet (8 mg total) by mouth every 8 (eight) hours as needed for nausea or vomiting. 07/27/21   Everlena Cooper, Adam R, DO  QVAR REDIHALER 80 MCG/ACT inhaler Use 2 puffs three times a day. 01/16/23   Marcelyn Bruins, MD  tamsulosin (FLOMAX) 0.4 MG CAPS capsule Take 1 tablet by mouth daily. 04/05/23   [provider]  Tezepelumab-ekko (TEZSPIRE) 210 MG/1. SOAJ Inject 210 mg into the skin every 28 (twenty-eight) days. 12/17/22   Nehemiah Settle, FNP  tretinoin (RETIN-A) 0.05 % cream Apply 0.05 % topically. 06/19/22   [provider]  zolpidem (AMBIEN) 10 MG tablet Take 10 mg by mouth at bedtime as needed. Patient not taking: Reported on 10/03/2023 09/26/22   [provider]    Family History Family History  Problem Relation Age of Onset   Breast cancer Mother        survivor   Hypertension Mother    Hyperlipidemia Mother    Other Father        pituitary tumor- gamma knife radiation   Heart disease Maternal Grandfather    Prostate cancer Maternal Grandfather        21s   Melanoma Sister    Prostate cancer Paternal Grandfather        59s   Ovarian cancer Maternal Aunt        mets to liver     Social History Social History   Tobacco Use   Smoking status: Never   Smokeless tobacco: Never  Vaping Use   Vaping status: Never Used  Substance Use Topics   Alcohol use: No   Drug use: No     Allergies   Food, Albuterol, Chocolate, Lisinopril, and Other   Review of Systems Review of Systems   Constitutional:  Negative for chills and fever.  HENT:  Negative for ear pain and sore throat.   Respiratory:  Positive for cough. Negative for shortness of breath.   Gastrointestinal:  Positive for diarrhea and nausea. Negative for abdominal pain and vomiting.     Physical Exam Triage Vital Signs ED Triage Vitals [10/03/23 1718]  Encounter Vitals Group     BP 117/77     Systolic BP Percentile      Diastolic BP Percentile      Pulse Rate 82     Resp 18     Temp 98.5 F (36.9 C)  Temp src      SpO2 98 %     Weight      Height      Head Circumference      Peak Flow      Pain Score      Pain Loc      Pain Education      Exclude from Growth Chart    No data found.  Updated Vital Signs BP 117/77   Pulse 82   Temp 98.5 F (36.9 C)   Resp 18   SpO2 98%   Visual Acuity Right Eye Distance:   Left Eye Distance:   Bilateral Distance:    Right Eye Near:   Left Eye Near:    Bilateral Near:     Physical Exam Constitutional:      General: He is not in acute distress. HENT:     Right Ear: Tympanic membrane normal.     Left Ear: Tympanic membrane normal.     Nose: Nose normal.     Mouth/Throat:     Mouth: Mucous membranes are moist.     Pharynx: Oropharynx is clear.  Cardiovascular:     Rate and Rhythm: Normal rate and regular rhythm.     Heart sounds: Normal heart sounds.  Pulmonary:     Effort: Pulmonary effort is normal. No respiratory distress.     Breath sounds: Normal breath sounds.  Neurological:     Mental Status: He is alert.      UC Treatments / Results  Labs (all labs ordered are listed, but only abnormal results are displayed) Labs Reviewed - No data to display  EKG   Radiology No results found.  Procedures Procedures (including critical care time)  Medications Ordered in UC Medications - No data to display  Initial Impression / Assessment and Plan / UC Course  I have reviewed the triage vital signs and the nursing  notes.  Pertinent labs & imaging results that were available during my care of the patient were reviewed by me and considered in my medical decision making (see chart for details).    Positive COVID test at home yesterday, COVID-19.  Afebrile and vital signs are stable.  Lungs are clear and O2 sat is 98% on room air.  Patient has history of CKD and nephrectomy.  Treating nausea with Zofran.  Instructed patient to keep himself hydrated with clear liquids such as water.  Advance diet as tolerated.  Instructed patient to follow-up with his PCP tomorrow.  ED precautions given.  Education provided on COVID.  Patient agrees to plan of care.  Final Clinical Impressions(s) / UC Diagnoses   Final diagnoses:  Positive self-administered antigen test for COVID-19  COVID-19     Discharge Instructions      Follow up with your primary care provider tomorrow.  Go to the emergency department if you have worsening symptoms.    Take the Zofran as directed for nausea or vomiting.  Yourself hydrated with clear liquids such as water.     ED Prescriptions     Medication Sig Dispense Auth. Provider   ondansetron (ZOFRAN-ODT) 4 MG disintegrating tablet Take 1 tablet (4 mg total) by mouth every 8 (eight) hours as needed for nausea or vomiting. 20 tablet Mickie Bail, NP      PDMP not reviewed this encounter.   Mickie Bail, NP 10/03/23 [phone]

## 2023-10-03 NOTE — Discharge Instructions (Addendum)
 Follow up with your primary care provider tomorrow.  Go to the emergency department if you have worsening symptoms.    Take the Zofran as directed for nausea or vomiting.  Yourself hydrated with clear liquids such as water.

## 2023-10-18 ENCOUNTER — Telehealth: Payer: Self-pay | Admitting: Allergy

## 2023-10-18 NOTE — Telephone Encounter (Signed)
 Left voicemail to give the office a call back to schedule Tezspire reapproval appointment.

## 2023-10-28 DIAGNOSIS — R339 Retention of urine, unspecified: Secondary | ICD-10-CM | POA: Diagnosis not present

## 2023-10-29 DIAGNOSIS — Z79899 Other long term (current) drug therapy: Secondary | ICD-10-CM | POA: Diagnosis not present

## 2023-10-29 DIAGNOSIS — F902 Attention-deficit hyperactivity disorder, combined type: Secondary | ICD-10-CM | POA: Diagnosis not present

## 2023-12-18 DIAGNOSIS — N182 Chronic kidney disease, stage 2 (mild): Secondary | ICD-10-CM | POA: Diagnosis not present

## 2023-12-18 DIAGNOSIS — N319 Neuromuscular dysfunction of bladder, unspecified: Secondary | ICD-10-CM | POA: Diagnosis not present

## 2023-12-18 DIAGNOSIS — Z905 Acquired absence of kidney: Secondary | ICD-10-CM | POA: Diagnosis not present

## 2023-12-19 DIAGNOSIS — N390 Urinary tract infection, site not specified: Secondary | ICD-10-CM | POA: Diagnosis not present

## 2023-12-19 DIAGNOSIS — R3989 Other symptoms and signs involving the genitourinary system: Secondary | ICD-10-CM | POA: Diagnosis not present

## 2023-12-19 DIAGNOSIS — R350 Frequency of micturition: Secondary | ICD-10-CM | POA: Diagnosis not present

## 2023-12-19 DIAGNOSIS — R3915 Urgency of urination: Secondary | ICD-10-CM | POA: Diagnosis not present

## 2024-02-18 DIAGNOSIS — N182 Chronic kidney disease, stage 2 (mild): Secondary | ICD-10-CM | POA: Diagnosis not present

## 2024-02-18 DIAGNOSIS — J069 Acute upper respiratory infection, unspecified: Secondary | ICD-10-CM | POA: Diagnosis not present

## 2024-02-18 DIAGNOSIS — F3341 Major depressive disorder, recurrent, in partial remission: Secondary | ICD-10-CM | POA: Diagnosis not present

## 2024-02-18 DIAGNOSIS — Z0000 Encounter for general adult medical examination without abnormal findings: Secondary | ICD-10-CM | POA: Diagnosis not present

## 2024-02-18 DIAGNOSIS — Z8639 Personal history of other endocrine, nutritional and metabolic disease: Secondary | ICD-10-CM | POA: Diagnosis not present

## 2024-02-18 DIAGNOSIS — R739 Hyperglycemia, unspecified: Secondary | ICD-10-CM | POA: Diagnosis not present

## 2024-02-19 DIAGNOSIS — R339 Retention of urine, unspecified: Secondary | ICD-10-CM | POA: Diagnosis not present

## 2024-02-28 DIAGNOSIS — Z79899 Other long term (current) drug therapy: Secondary | ICD-10-CM | POA: Diagnosis not present

## 2024-02-28 DIAGNOSIS — F902 Attention-deficit hyperactivity disorder, combined type: Secondary | ICD-10-CM | POA: Diagnosis not present

## 2024-02-28 DIAGNOSIS — F419 Anxiety disorder, unspecified: Secondary | ICD-10-CM | POA: Diagnosis not present

## 2024-04-13 ENCOUNTER — Ambulatory Visit
Admission: EM | Admit: 2024-04-13 | Discharge: 2024-04-13 | Disposition: A | Discharge status: Home / Self Care | Attending: Emergency Medicine | Admitting: Emergency Medicine

## 2024-04-13 DIAGNOSIS — R300 Dysuria: Secondary | ICD-10-CM | POA: Diagnosis not present

## 2024-04-13 DIAGNOSIS — N3000 Acute cystitis without hematuria: Secondary | ICD-10-CM | POA: Diagnosis not present

## 2024-04-13 LAB — POCT URINE DIPSTICK
Bilirubin, UA: NEGATIVE
Glucose, UA: NEGATIVE mg/dL
Ketones, POC UA: NEGATIVE mg/dL
Nitrite, UA: POSITIVE — AB
POC PROTEIN,UA: 100 — AB
Spec Grav, UA: 1.015 (ref 1.010–1.025)
Urobilinogen, UA: 0.2 U/dL
pH, UA: 7 (ref 5.0–8.0)

## 2024-04-13 MED ORDER — CIPROFLOXACIN HCL 500 MG PO TABS: 500.0000 mg | ORAL_TABLET | Freq: Two times a day (BID) | ORAL | 0 refills | Status: AC

## 2024-04-13 NOTE — ED Notes (Signed)
 Patient triage by  Teresa Shelba SAUNDERS, NP

## 2024-04-13 NOTE — ED Provider Notes (Signed)
 James Matthews    CSN: [id number] Arrival date & time: 04/13/24  1742      History   Chief Complaint No chief complaint on file.   HPI James Matthews is a 28 y.o. male.   Patient presents for evaluation of dysuria, lower foul odor and flank pain beginning 2 days ago.  History of neurogenic bladder, requires catheterization.  Has not attempted use of Azo which has been somewhat helpful, additionally has attempted use of Tylenol .  Denies hematuria, urinary frequency and fever.  Past Medical History:  Diagnosis Date   Asthma    Hypertension    IBS (irritable bowel syndrome)    probiotic   Orthostatic hypertension    Posterior urethral valves    Premature birth    3 months early   Renal disorder    Verbal apraxia     Patient Active Problem List   Diagnosis Date Noted   Seasonal and perennial allergic rhinitis 06/27/2022   Allergic conjunctivitis of both eyes 06/27/2022   Anaphylaxis due to food 06/27/2022   Orthostatic hypotension 09/02/2020   Syncope 05/27/2020   Palpitations 05/27/2020   PTSD (post-traumatic stress disorder) 10/19/2019   Daily headache 06/02/2019   Asthma, severe persistent (HCC) 05/25/2019   Social anxiety disorder 05/04/2018   Insomnia 05/04/2018   Mesenteric lymphadenopathy 07/27/2016   Posterior urethral valves (PUV) 06/04/2016   GERD (gastroesophageal reflux disease) 06/04/2016   Essential hypertension 06/04/2016   Bladder spasms 06/04/2016   GAD (generalized anxiety disorder) 06/04/2016   CKD (chronic kidney disease), stage II 06/04/2016   Major depression, recurrent 06/04/2016   Premature birth    IBS (irritable bowel syndrome)    Verbal apraxia     Past Surgical History:  Procedure Laterality Date   ADENOIDECTOMY     ESOPHAGEAL MANOMETRY N/A 01/23/2016   Procedure: ESOPHAGEAL MANOMETRY (EM);  Surgeon: Renaye Sous, MD;  Location: [facility];  Service: Endoscopy;  Laterality: N/A;   LASIK     eye surgery july 2017    NEPHRECTOMY     rght    OTHER SURGICAL HISTORY     TONSILLECTOMY         Home Medications    Prior to Admission medications   Medication Sig Start Date End Date Taking? Authorizing Provider  ciprofloxacin (CIPRO) 500 MG tablet Take 1 tablet (500 mg total) by mouth 2 (two) times daily. 04/13/24  Yes Cordarius Benning R, NP  ALPRAZolam  (XANAX ) 1 MG tablet Take 1 mg by mouth 2 (two) times daily as needed for anxiety.     [provider]  azelastine  (ASTELIN ) 0.1 % nasal spray 2 sprays each nostril twice a day for runny nose control 01/16/23   [name], MD  baclofen (LIORESAL) 10 MG tablet Take 10 mg by mouth daily. Patient not taking: Reported on 10/03/2023 06/06/23   [provider]  busPIRone  (BUSPAR ) 30 MG tablet TAKE 1 TABLET BY MOUTH TWICE A DAY 05/30/20   [name], MD  desvenlafaxine  (PRISTIQ ) 100 MG 24 hr tablet Take 200 mg by mouth daily. 10/30/20   [provider]  emtricitabine-tenofovir (TRUVADA) 200-300 MG tablet Take 1 tablet by mouth daily. Patient not taking: Reported on 10/03/2023 05/24/23   [provider]  EPINEPHrine  0.3 mg/0.3 mL IJ SOAJ injection INJECT 0.3 MLS INTO THE MUSCLE ONCE FOR 1 DOSE. AS DIRECTED FOR LIFE-THREATENING ALLERGIC REACTIONS 01/16/23   [name], MD  Erenumab -aooe (AIMOVIG ) 140 MG/ML SOAJ Inject 140 mg into the  skin every 30 (thirty) days. Patient not taking: Reported on 10/03/2023 03/27/22   [name], DO  levalbuterol  (XOPENEX  HFA) 45 MCG/ACT inhaler INHALE 2 PUFFS INTO THE LUNGS EVERY 4 HOURS AS NEEDED FOR WHEEZE 01/16/23   [name], MD  levalbuterol  (XOPENEX ) 1.25 MG/3ML nebulizer solution 1 vial via nebulizer every 4-6 hours as needed. 01/16/23   [name], MD  lidocaine  (XYLOCAINE ) 2 % solution Use as directed 15 mLs in the mouth or throat as needed for mouth pain (Gargle and spit out.). Patient not taking: Reported on 06/21/2023 05/24/23    [name], NP  lisdexamfetamine (VYVANSE ) 40 MG capsule Take 1 capsule (40 mg total) by mouth daily. 12/24/22     losartan  (COZAAR ) 50 MG tablet Take 1.5 tablets (75 mg total) by mouth daily. 12/15/20   [name], MD  ondansetron  (ZOFRAN -ODT) 4 MG disintegrating tablet Take 1 tablet (4 mg total) by mouth every 8 (eight) hours as needed for nausea or vomiting. 10/03/23   [name], NP  ondansetron  (ZOFRAN -ODT) 8 MG disintegrating tablet Take 1 tablet (8 mg total) by mouth every 8 (eight) hours as needed for nausea or vomiting. 07/27/21   [name], DO  QVAR  REDIHALER 80 MCG/ACT inhaler Use 2 puffs three times a day. 01/16/23   [name], MD  tamsulosin (FLOMAX) 0.4 MG CAPS capsule Take 1 tablet by mouth daily. 04/05/23   [provider]  Tezepelumab -ekko (TEZSPIRE ) 210 MG/1. SOAJ Inject 210 mg into the skin every 28 (twenty-eight) days. 12/17/22   [name], FNP  tretinoin (RETIN-A) 0.05 % cream Apply 0.05 % topically. 06/19/22   [provider]  zolpidem  (AMBIEN ) 10 MG tablet Take 10 mg by mouth at bedtime as needed. Patient not taking: Reported on 10/03/2023 09/26/22   [provider]    Family History Family History  Problem Relation Age of Onset   Breast cancer Mother        survivor   Hypertension Mother    Hyperlipidemia Mother    Other Father        pituitary tumor- gamma knife radiation   Heart disease Maternal Grandfather    Prostate cancer Maternal Grandfather        21s   Melanoma Sister    Prostate cancer Paternal Grandfather        48s   Ovarian cancer Maternal Aunt        mets to liver     Social History Social History   Tobacco Use   Smoking status: Never   Smokeless tobacco: Never  Vaping Use   Vaping status: Never Used  Substance Use Topics   Alcohol use: No   Drug use: No     Allergies   Food, Albuterol , Chocolate, Lisinopril, and Other   Review of Systems Review of Systems   Physical  Exam Triage Vital Signs ED Triage Vitals [04/13/24 1929]  Encounter Vitals Group     BP 116/82     Girls Systolic BP Percentile      Girls Diastolic BP Percentile      Boys Systolic BP Percentile      Boys Diastolic BP Percentile      Pulse Rate 96     Resp 18     Temp 99.1 F (37.3 C)     Temp Source Oral     SpO2 100 %     Weight      Height  Head Circumference      Peak Flow      Pain Score      Pain Loc      Pain Education      Exclude from Growth Chart    No data found.  Updated Vital Signs BP 116/82   Pulse 96   Temp 99.1 F (37.3 C) (Oral)   Resp 18   SpO2 100%   Visual Acuity Right Eye Distance:   Left Eye Distance:   Bilateral Distance:    Right Eye Near:   Left Eye Near:    Bilateral Near:     Physical Exam Constitutional:      Appearance: Normal appearance.  Eyes:     Extraocular Movements: Extraocular movements intact.  Pulmonary:     Effort: Pulmonary effort is normal.  Abdominal:     Tenderness: There is abdominal tenderness in the suprapubic area. There is no right CVA tenderness, left CVA tenderness or guarding.  Neurological:     Mental Status: He is alert and oriented to person, place, and time. Mental status is at baseline.      UC Treatments / Results  Labs (all labs ordered are listed, but only abnormal results are displayed) Labs Reviewed  POCT URINE DIPSTICK - Abnormal; Notable for the following components:      Result Value   Clarity, UA cloudy (*)    Blood, UA moderate (*)    POC PROTEIN,UA =100 (*)    Nitrite, UA Positive (*)    Leukocytes, UA Small (1+) (*)    All other components within normal limits  URINE CULTURE    EKG   Radiology No results found.  Procedures Procedures (including critical care time)  Medications Ordered in UC Medications - No data to display  Initial Impression / Assessment and Plan / UC Course  I have reviewed the triage vital signs and the nursing notes.  Pertinent labs &  imaging results that were available during my care of the patient were reviewed by me and considered in my medical decision making (see chart for details).  Acute cystitis without hematuria, dysuria  Urinalysis showing leukocytes and nitrates, sent for culture, prescribe ciprofloxacin, per patient has had success within the past, recommended over-the-counter medications and nonpharmacological supportive care with follow-up with urgent care as needed Final Clinical Impressions(s) / UC Diagnoses   Final diagnoses:  Dysuria  Acute cystitis without hematuria     Discharge Instructions      Your urinalysis shows Winfrey Chillemi blood cells and nitrates which are indicative of infection, your urine will be sent to the lab to determine exactly which bacteria is present, if any changes need to be made to your medications you will be notified  Begin use of Ciprofloxacin twice daily for 7 days  You may use over-the-counter Azo to help minimize your symptoms until antibiotic removes bacteria, this medication will turn your urine orange  Increase your fluid intake through use of water   If symptoms continue to persist after use of medication or recur please follow-up with urgent care or your primary doctor as needed    ED Prescriptions     Medication Sig Dispense Auth. Provider   ciprofloxacin (CIPRO) 500 MG tablet Take 1 tablet (500 mg total) by mouth 2 (two) times daily. 14 tablet Makynzee Tigges R, NP      PDMP not reviewed this encounter.   Teresa Shelba SAUNDERS, NP 04/13/24 [phone]

## 2024-04-13 NOTE — Discharge Instructions (Signed)
 Your urinalysis shows James Matthews blood cells and nitrates which are indicative of infection, your urine will be sent to the lab to determine exactly which bacteria is present, if any changes need to be made to your medications you will be notified  Begin use of Ciprofloxacin twice daily for 7 days  You may use over-the-counter Azo to help minimize your symptoms until antibiotic removes bacteria, this medication will turn your urine orange  Increase your fluid intake through use of water   If symptoms continue to persist after use of medication or recur please follow-up with urgent care or your primary doctor as needed

## 2024-04-16 LAB — URINE CULTURE: Culture: >=100000 — AB

## 2024-04-17 ENCOUNTER — Ambulatory Visit (HOSPITAL_COMMUNITY): Payer: Self-pay

## 2024-04-28 DIAGNOSIS — N390 Urinary tract infection, site not specified: Secondary | ICD-10-CM | POA: Diagnosis not present

## 2024-04-28 DIAGNOSIS — Z79899 Other long term (current) drug therapy: Secondary | ICD-10-CM | POA: Diagnosis not present

## 2024-04-28 DIAGNOSIS — T753XXA Motion sickness, initial encounter: Secondary | ICD-10-CM | POA: Diagnosis not present

## 2024-04-28 DIAGNOSIS — N182 Chronic kidney disease, stage 2 (mild): Secondary | ICD-10-CM | POA: Diagnosis not present

## 2024-06-26 DIAGNOSIS — E559 Vitamin D deficiency, unspecified: Secondary | ICD-10-CM | POA: Diagnosis not present

## 2024-06-26 DIAGNOSIS — D649 Anemia, unspecified: Secondary | ICD-10-CM | POA: Diagnosis not present

## 2024-06-26 DIAGNOSIS — Z79899 Other long term (current) drug therapy: Secondary | ICD-10-CM | POA: Diagnosis not present
